# Patient Record
Sex: Female | Born: 1950 | Race: White | Hispanic: No | Marital: Married | Smoking: Never smoker
Health system: Southern US, Community
[De-identification: ages and names within clinical notes are randomized; demographics above are authoritative.]

## PROBLEM LIST (undated history)

## (undated) DIAGNOSIS — C801 Malignant (primary) neoplasm, unspecified: Secondary | ICD-10-CM

## (undated) DIAGNOSIS — K219 Gastro-esophageal reflux disease without esophagitis: Secondary | ICD-10-CM

## (undated) DIAGNOSIS — R112 Nausea with vomiting, unspecified: Secondary | ICD-10-CM

## (undated) DIAGNOSIS — Z9889 Other specified postprocedural states: Secondary | ICD-10-CM

## (undated) HISTORY — DX: Gastro-esophageal reflux disease without esophagitis: K21.9

## (undated) HISTORY — PX: TONSILLECTOMY: SUR1361

## (undated) HISTORY — PX: SKIN CANCER EXCISION: SHX779

## (undated) HISTORY — PX: BREAST BIOPSY: SHX20

## (undated) HISTORY — PX: CHOLECYSTECTOMY: SHX55

---

## 2007-12-07 HISTORY — PX: CHOLECYSTECTOMY: SHX55

## 2011-12-12 ENCOUNTER — Encounter (HOSPITAL_COMMUNITY): Payer: Self-pay | Admitting: Orthopedic Surgery

## 2011-12-12 ENCOUNTER — Emergency Department (HOSPITAL_COMMUNITY)
Admission: EM | Admit: 2011-12-12 | Discharge: 2011-12-13 | Disposition: A | Discharge status: Home / Self Care | Payer: Worker's Compensation | Attending: Emergency Medicine | Admitting: Emergency Medicine

## 2011-12-12 ENCOUNTER — Encounter (HOSPITAL_COMMUNITY): Payer: Self-pay | Admitting: Anesthesiology

## 2011-12-12 ENCOUNTER — Encounter (HOSPITAL_COMMUNITY): Payer: Self-pay | Admitting: Emergency Medicine

## 2011-12-12 ENCOUNTER — Emergency Department (HOSPITAL_COMMUNITY): Payer: Worker's Compensation

## 2011-12-12 ENCOUNTER — Emergency Department (HOSPITAL_COMMUNITY): Payer: Worker's Compensation | Admitting: Anesthesiology

## 2011-12-12 ENCOUNTER — Encounter (HOSPITAL_COMMUNITY)
Admission: EM | Disposition: A | Discharge status: Home / Self Care | Payer: Self-pay | Source: Home / Self Care | Attending: Emergency Medicine

## 2011-12-12 DIAGNOSIS — S62639B Displaced fracture of distal phalanx of unspecified finger, initial encounter for open fracture: Secondary | ICD-10-CM | POA: Insufficient documentation

## 2011-12-12 DIAGNOSIS — Y9229 Other specified public building as the place of occurrence of the external cause: Secondary | ICD-10-CM | POA: Insufficient documentation

## 2011-12-12 DIAGNOSIS — S62502B Fracture of unspecified phalanx of left thumb, initial encounter for open fracture: Secondary | ICD-10-CM

## 2011-12-12 DIAGNOSIS — W230XXA Caught, crushed, jammed, or pinched between moving objects, initial encounter: Secondary | ICD-10-CM | POA: Insufficient documentation

## 2011-12-12 DIAGNOSIS — S61209A Unspecified open wound of unspecified finger without damage to nail, initial encounter: Secondary | ICD-10-CM | POA: Insufficient documentation

## 2011-12-12 DIAGNOSIS — Y990 Civilian activity done for income or pay: Secondary | ICD-10-CM | POA: Insufficient documentation

## 2011-12-12 HISTORY — DX: Malignant (primary) neoplasm, unspecified: C80.1

## 2011-12-12 HISTORY — PX: NAILBED REPAIR: SHX5028

## 2011-12-12 HISTORY — PX: INCISION AND DRAINAGE: SHX5863

## 2011-12-12 HISTORY — PX: PERCUTANEOUS PINNING: SHX2209

## 2011-12-12 LAB — BASIC METABOLIC PANEL
BUN: 10 mg/dL (ref 6–23)
CO2: 23 mEq/L (ref 19–32)
Calcium: 9.5 mg/dL (ref 8.4–10.5)
Chloride: 105 mEq/L (ref 96–112)
Creatinine, Ser: 0.64 mg/dL (ref 0.50–1.10)
GFR calc Af Amer: >90 mL/min (ref >90)
GFR calc non Af Amer: >90 mL/min (ref >90)
Glucose, Bld: 86 mg/dL (ref 70–99)
Potassium: 3.7 mEq/L (ref 3.5–5.1)
Sodium: 140 mEq/L (ref 135–145)

## 2011-12-12 LAB — CBC WITH DIFFERENTIAL/PLATELET
Basophils Absolute: 0 10*3/uL (ref 0.0–0.1)
Basophils Relative: 0 % (ref 0–1)
Eosinophils Absolute: 0.1 10*3/uL (ref 0.0–0.7)
Eosinophils Relative: 1 % (ref 0–5)
HCT: 44.2 % (ref 36.0–46.0)
Hemoglobin: 14.7 g/dL (ref 12.0–15.0)
Lymphocytes Relative: 31 % (ref 12–46)
Lymphs Abs: 2.7 10*3/uL (ref 0.7–4.0)
MCH: 28.4 pg (ref 26.0–34.0)
MCHC: 33.3 g/dL (ref 30.0–36.0)
MCV: 85.5 fL (ref 78.0–100.0)
Monocytes Absolute: 0.3 10*3/uL (ref 0.1–1.0)
Monocytes Relative: 4 % (ref 3–12)
Neutro Abs: 5.5 10*3/uL (ref 1.7–7.7)
Neutrophils Relative %: 64 % (ref 43–77)
Platelets: 244 10*3/uL (ref 150–400)
RBC: 5.17 MIL/uL — ABNORMAL HIGH (ref 3.87–5.11)
RDW: 13.7 % (ref 11.5–15.5)
WBC: 8.7 10*3/uL (ref 4.0–10.5)

## 2011-12-12 LAB — TROPONIN I: Troponin I: <0.3 ng/mL (ref <0.30)

## 2011-12-12 IMAGING — CR DG FINGER THUMB 2+V*L*
3 series · 3 of 3 positions shown · non-contrast
Comparison: None.

CLINICAL DATA: Laceration, trauma

LEFT THUMB 2+V

[x finger pa left]
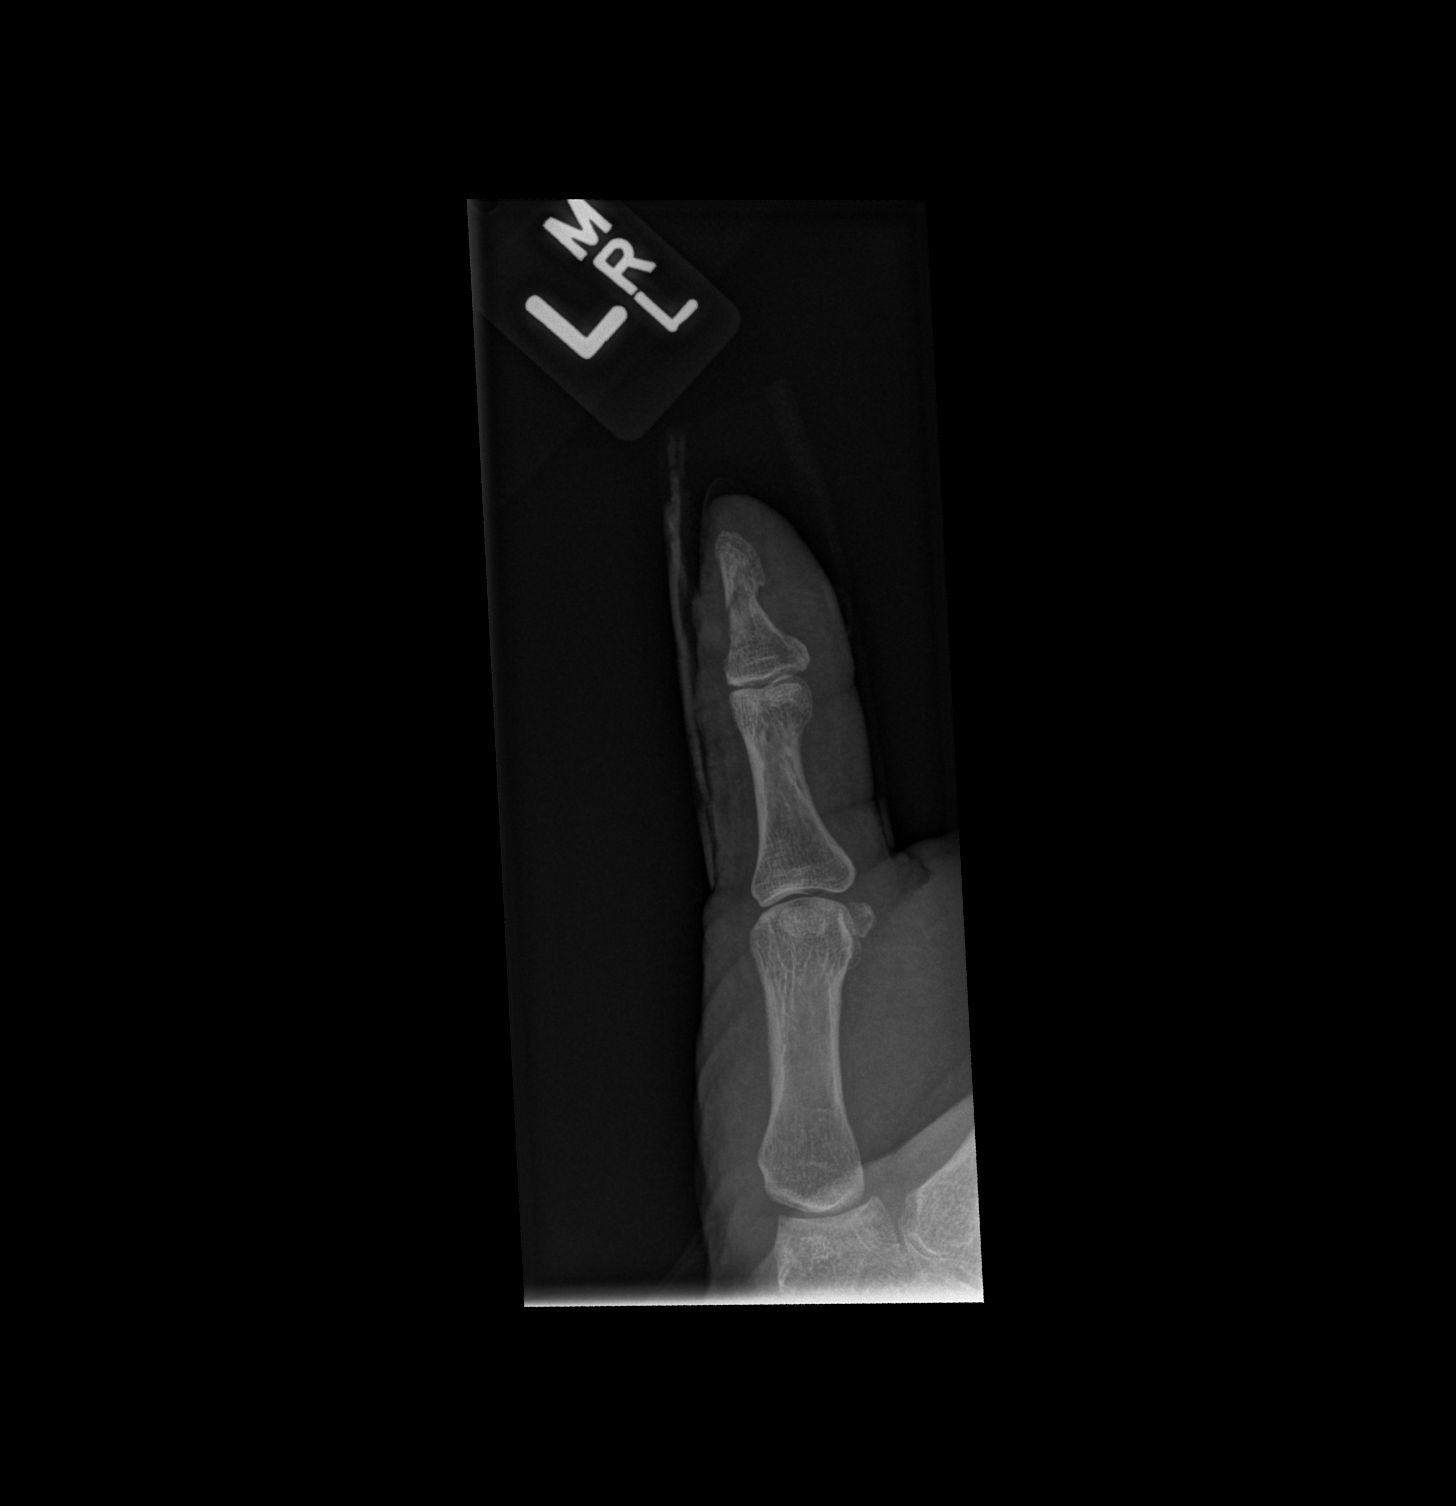

[x finger obl left]
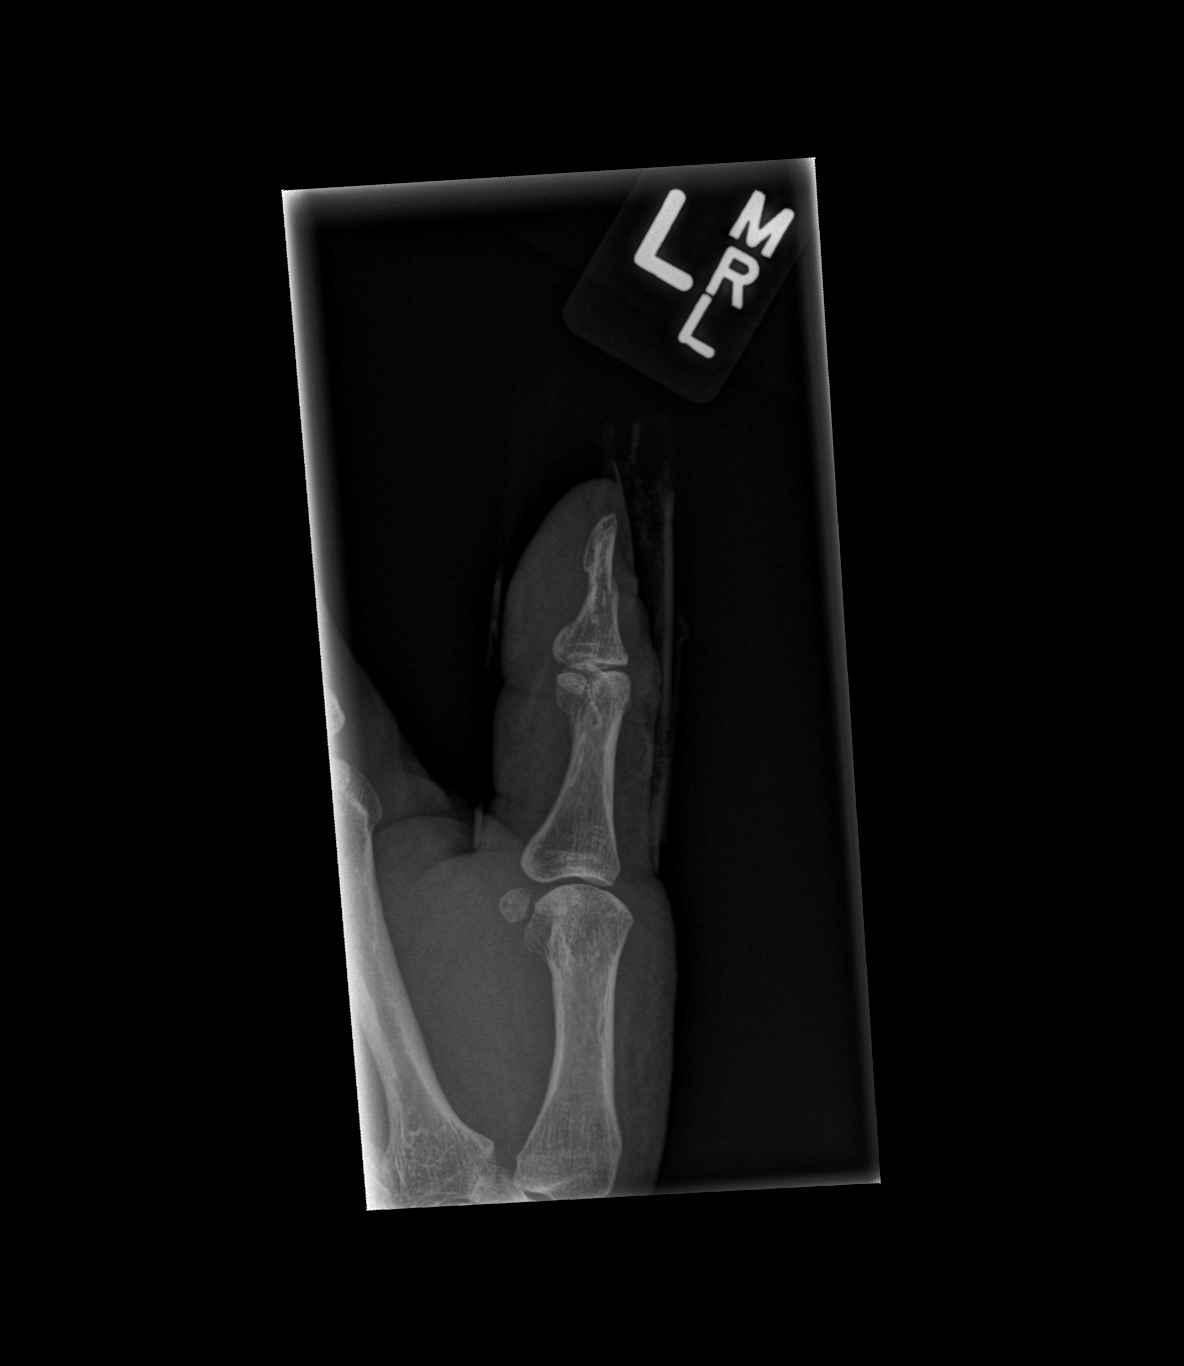

[x finger lat left]
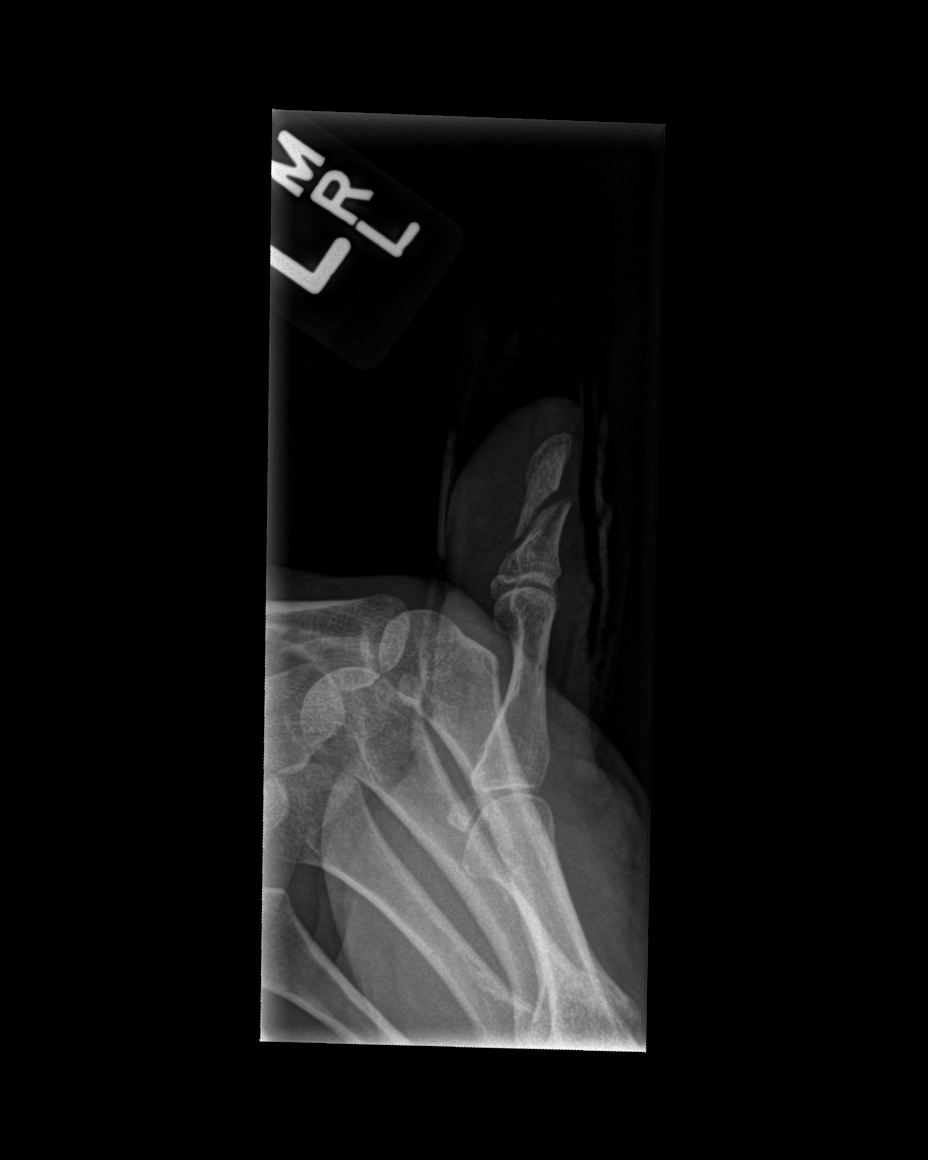

[3 of 3 positions shown; findings below may reference images not displayed]

FINDINGS: Three views of the left thumb submitted.  There is
oblique displaced fracture of distal phalanx left thumb.
IMPRESSION: Oblique mild displaced fracture of distal phalanx left thumb.

## 2011-12-12 SURGERY — REPAIR, NAIL BED
Anesthesia: General | Site: Thumb | Laterality: Left | Wound class: Contaminated

## 2011-12-12 MED ORDER — FENTANYL CITRATE 0.05 MG/ML IJ SOLN
INTRAMUSCULAR | Status: DC | PRN
  Administered 2011-12-12 (×2): 50 ug via INTRAVENOUS

## 2011-12-12 MED ORDER — 0.9 % SODIUM CHLORIDE (POUR BTL) OPTIME
TOPICAL | Status: DC | PRN
  Administered 2011-12-12: 500 mL

## 2011-12-12 MED ORDER — MIDAZOLAM HCL 5 MG/5ML IJ SOLN
INTRAMUSCULAR | Status: DC | PRN
  Administered 2011-12-12: 1 mg via INTRAVENOUS

## 2011-12-12 MED ORDER — LACTATED RINGERS IV SOLN
INTRAVENOUS | Status: DC | PRN
  Administered 2011-12-12: 23:00:00 via INTRAVENOUS

## 2011-12-12 MED ORDER — LIDOCAINE HCL 1 % IJ SOLN
5.0000 mL | Freq: Once | INTRAMUSCULAR | Status: AC
  Administered 2011-12-12: 5 mL via INTRADERMAL

## 2011-12-12 MED ORDER — MORPHINE SULFATE 4 MG/ML IJ SOLN
4.0000 mg | Freq: Once | INTRAMUSCULAR | Status: AC
  Administered 2011-12-12: 4 mg via INTRAMUSCULAR
  Filled 2011-12-12: qty 1

## 2011-12-12 MED ORDER — CEFAZOLIN SODIUM-DEXTROSE 2-3 GM-% IV SOLR
INTRAVENOUS | Status: DC | PRN
  Administered 2011-12-12: 2 g via INTRAVENOUS

## 2011-12-12 MED ORDER — CEFAZOLIN SODIUM-DEXTROSE 2-3 GM-% IV SOLR
INTRAVENOUS | Status: AC
  Filled 2011-12-12: qty 50

## 2011-12-12 MED ORDER — BUPIVACAINE HCL (PF) 0.5 % IJ SOLN
INTRAMUSCULAR | Status: DC | PRN
  Administered 2011-12-12: 10 mL

## 2011-12-12 MED ORDER — LIDOCAINE HCL (CARDIAC) 20 MG/ML IV SOLN
INTRAVENOUS | Status: DC | PRN
  Administered 2011-12-12: 80 mg via INTRAVENOUS

## 2011-12-12 MED ORDER — SODIUM CHLORIDE 0.9 % IV BOLUS (SEPSIS)
1000.0000 mL | Freq: Once | INTRAVENOUS | Status: AC
  Administered 2011-12-12: 1000 mL via INTRAVENOUS

## 2011-12-12 MED ORDER — ONDANSETRON HCL 4 MG/2ML IJ SOLN
INTRAMUSCULAR | Status: DC | PRN
  Administered 2011-12-12: 4 mg via INTRAVENOUS

## 2011-12-12 MED ORDER — PROPOFOL 10 MG/ML IV BOLUS
INTRAVENOUS | Status: DC | PRN
  Administered 2011-12-12: 160 mg via INTRAVENOUS

## 2011-12-12 MED ORDER — BUPIVACAINE HCL (PF) 0.5 % IJ SOLN
INTRAMUSCULAR | Status: AC
  Filled 2011-12-12: qty 30

## 2011-12-12 MED ORDER — ONDANSETRON 8 MG PO TBDP
8.0000 mg | ORAL_TABLET | Freq: Once | ORAL | Status: AC
  Administered 2011-12-12: 8 mg via ORAL
  Filled 2011-12-12: qty 1

## 2011-12-12 MED ORDER — LIDOCAINE HCL (PF) 1 % IJ SOLN: 5.0000 mL | Freq: Once | INTRAMUSCULAR | Status: DC

## 2011-12-12 MED ORDER — SODIUM CHLORIDE 0.9 % IV SOLN: Freq: Once | INTRAVENOUS | Status: DC

## 2011-12-12 SURGICAL SUPPLY — 40 items
BAG ZIPLOCK 12X15 (MISCELLANEOUS) ×2 IMPLANT
BANDAGE ELASTIC 3 VELCRO ST LF (GAUZE/BANDAGES/DRESSINGS) ×2 IMPLANT
BANDAGE ELASTIC 4 VELCRO ST LF (GAUZE/BANDAGES/DRESSINGS) IMPLANT
BANDAGE GAUZE ELAST BULKY 4 IN (GAUZE/BANDAGES/DRESSINGS) ×2 IMPLANT
BNDG COHESIVE 4X5 TAN STRL (GAUZE/BANDAGES/DRESSINGS) IMPLANT
CANISTER SUCTION 2500CC (MISCELLANEOUS) ×2 IMPLANT
CLEANER TIP ELECTROSURG 2X2 (MISCELLANEOUS) ×2 IMPLANT
CLOTH BEACON ORANGE TIMEOUT ST (SAFETY) ×2 IMPLANT
CORDS BIPOLAR (ELECTRODE) ×2 IMPLANT
CUFF TOURN SGL QUICK 18 (TOURNIQUET CUFF) ×2 IMPLANT
CUFF TOURN SGL QUICK 24 (TOURNIQUET CUFF)
CUFF TRNQT CYL 24X4X40X1 (TOURNIQUET CUFF) IMPLANT
DRAIN PENROSE 18X1/2 LTX STRL (DRAIN) IMPLANT
DRSG PAD ABDOMINAL 8X10 ST (GAUZE/BANDAGES/DRESSINGS) IMPLANT
ELECT REM PT RETURN 9FT ADLT (ELECTROSURGICAL)
ELECTRODE REM PT RTRN 9FT ADLT (ELECTROSURGICAL) IMPLANT
GAUZE PACKING IODOFORM 1/2 (PACKING) IMPLANT
GAUZE PACKING IODOFORM 1/4X5 (PACKING) IMPLANT
GAUZE XEROFORM 1X8 LF (GAUZE/BANDAGES/DRESSINGS) ×2 IMPLANT
GAUZE XEROFORM 5X9 LF (GAUZE/BANDAGES/DRESSINGS) IMPLANT
GLOVE SURG ORTHO 8.0 STRL STRW (GLOVE) ×2 IMPLANT
GOWN STRL REIN XL XLG (GOWN DISPOSABLE) ×4 IMPLANT
HANDPIECE INTERPULSE COAX TIP (DISPOSABLE)
KIT BASIN OR (CUSTOM PROCEDURE TRAY) ×2 IMPLANT
KWIRE 4.0 X .035IN (WIRE) ×2 IMPLANT
MANIFOLD NEPTUNE II (INSTRUMENTS) IMPLANT
PACK LOWER EXTREMITY WL (CUSTOM PROCEDURE TRAY) ×2 IMPLANT
PAD CAST 4YDX4 CTTN HI CHSV (CAST SUPPLIES) IMPLANT
PADDING CAST ABS 3INX4YD NS (CAST SUPPLIES) ×1
PADDING CAST ABS COTTON 3X4 (CAST SUPPLIES) ×1 IMPLANT
PADDING CAST COTTON 4X4 STRL (CAST SUPPLIES)
SET HNDPC FAN SPRY TIP SCT (DISPOSABLE) IMPLANT
SPLINT PLASTER EXTRA FAST 3X15 (CAST SUPPLIES) ×1
SPLINT PLASTER GYPS XFAST 3X15 (CAST SUPPLIES) ×1 IMPLANT
SPONGE GAUZE 4X4 12PLY (GAUZE/BANDAGES/DRESSINGS) ×4 IMPLANT
SUT CHROMIC 6 0 (SUTURE) ×2 IMPLANT
SUT ETHILON 4 0 PS 2 18 (SUTURE) ×4 IMPLANT
SUT SILK 4 0 PS 2 (SUTURE) ×2 IMPLANT
SYR 20CC LL (SYRINGE) IMPLANT
SYR CONTROL 10ML LL (SYRINGE) ×2 IMPLANT

## 2011-12-12 NOTE — Progress Notes (Signed)
pcp per pt is [employer] EPIC updated

## 2011-12-12 NOTE — ED Notes (Signed)
Pt c/o chest pain. ECG completed.  Will notify MD.

## 2011-12-12 NOTE — ED Provider Notes (Signed)
History     CSN: [id number]  Arrival date & time 12/12/11  1239   First MD Initiated Contact with Patient 12/12/11 1313      Chief Complaint  Patient presents with  . Extremity Laceration  . 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 female who presented to ED with complaint of left thumb injury. Pt states she closed her hand in a bank safe. Laceration with fracture. Pt was seen at urgent care, fracture seen, sent here to be seen by a hand specialist. Pt denies numbness or weakness distal to the injury. No other injuries.    Past Medical History  Diagnosis Date  . Cancer     Past Surgical History  Procedure Date  . Cholecystectomy   . Skin cancer excision   . Cesarean section   . Tonsillectomy     Family History  Problem Relation Age of Onset  . Hypertension Mother   . Diabetes Mother   . Cancer Mother     History  Substance Use Topics  . Smoking status: Never Smoker   . Smokeless tobacco: Not on file  . Alcohol Use: No    OB History    Grav Para Term Preterm Abortions TAB SAB Ect Mult Living                  Review of Systems  Constitutional: Negative for fever and chills.  Musculoskeletal: Positive for arthralgias.  Skin: Positive for wound.  Neurological: Negative for dizziness, weakness and numbness.    Allergies  Review of patient's allergies indicates no known allergies.  Home Medications   Current Outpatient Rx  Name  Route  Sig  Dispense  Refill  . ACETAMINOPHEN 500 MG PO TABS   Oral   Take 500 mg by mouth every 6 (six) hours as needed. pain         . CALCIUM CARBONATE-VITAMIN D 500-200 MG-UNIT PO TABS   Oral   Take 1 tablet by mouth daily.         Marland Kitchen VITAMIN D-3 1000 UNITS PO CAPS   Oral   Take 1,000 Units by mouth daily.         Marland Kitchen FLAXSEED OIL PO   Oral   Take 1 capsule by mouth daily.          . MELOXICAM PO   Oral   Take 15 mg by mouth daily as needed. Pain         . FISH OIL PO   Oral   Take 1 capsule by mouth daily.         Marland Kitchen OMEPRAZOLE 20 MG PO CPDR   Oral   Take 20 mg by mouth daily.         Marland Kitchen METAMUCIL PO   Oral   Take 1 packet by mouth daily.           BP 145/83  Pulse 88  Temp 98 F (36.7 C) (Oral)  Resp 18  Wt 180 lb (81.647 kg)  SpO2 100%  Physical Exam  Nursing note and vitals reviewed. Constitutional: She is oriented to person, place, and time. She appears well-developed and well-nourished. No distress.  Eyes: Conjunctivae normal are normal.  Cardiovascular: Normal rate, regular rhythm and normal heart sounds.   Pulmonary/Chest: Effort normal and breath sounds normal. No respiratory distress. She has no wheezes. She has no rales.  Musculoskeletal:       There is a laceration and deformity to the left distal thumb. Laceration through the radial aspect of distal thumb, just proximal to the finger nail. During my exam pt already had a block by PA green. Unable to assess sensation. Normal MCP joint  Neurological: She is alert and oriented to person, place, and time.  Skin: Skin is warm and dry.  Psychiatric: She has a normal mood and affect.    ED Course  Procedures (including critical care time)   Labs Reviewed  CBC WITH DIFFERENTIAL  BASIC METABOLIC PANEL   Dg Finger Thumb Left  12/12/2011  *RADIOLOGY REPORT*  Clinical Data: Laceration, trauma  LEFT THUMB 2+V  Comparison: None.  Findings: Three views of the left thumb submitted.  There is oblique displaced fracture of distal phalanx left thumb.  IMPRESSION: Oblique mild displaced fracture of distal phalanx left thumb.   Original Report Authenticated By: Natasha Mead, M.D.     4:14 PM Spoke with Dr. Merlyn Lot, will come see in ED, prob in 2hrs. currently in surgery. Pt made aware. Pt is NPO   8:05 PM Pt had an episode of chest pain after receiving morphine. ECG obtained, unremarkable,  tropnonin ordered. Pt has no cardiac hx, pain is sharp, comes and goes.    Date: 12/13/2011  Rate: 80Rhythm: normal sinus rhythm  QRS Axis: left  Intervals: normal  ST/T Wave abnormalities: normal  Conduction Disutrbances:none  Narrative Interpretation:   Old EKG Reviewed: none available    pt taken to OR  1. Open fracture of left thumb       MDM          Lottie Mussel, PA 12/14/11 [phone]

## 2011-12-12 NOTE — Op Note (Signed)
Dictation [phone]

## 2011-12-12 NOTE — ED Provider Notes (Signed)
Stacy Clayton is a 61 y.o. female who injured her left thumb, when it was shut in a heavy drawer. She has an isolated injury to the left thumb. She was seen at an outlying urgent care, and sent here, for definitive treatment. She has a disc, with her, reported to have images on it; they could not be retrieved.  Left thumb, gaping laceration, radial aspect, just behind a with slight ulnar deviation to the distal thumb. Tiny subungual hematoma. Intact distal sensation and circulation.  We'll reimage thumb.  Medical screening examination/treatment/procedure(s) were conducted as a shared visit with non-physician practitioner(s) and myself.  I personally evaluated the patient during the encounter  Flint Melter, MD 12/15/11 [phone]

## 2011-12-12 NOTE — ED Provider Notes (Signed)
MSE was initiated and I personally evaluated the patient and placed orders  (right thumb xray, npo, cbc, bmp, right thumb digital block)  at  2:14 PM on December 12, 2011. Dr. Effie Shy examined pt briefly as well.  The patient appears stable so that the remainder of the MSE may be completed by another provider.    Dorthula Matas, PA 12/12/11 1546

## 2011-12-12 NOTE — H&P (Signed)
Stacy Clayton is an 61 y.o. female.   Chief Complaint: left thumb tip crush HPI: 61 yo female caught left thumb in safe door early today causing crush to tip of thumb with laceration ulnarly.  Seen at [facility] where XR revealed distal phalanx fracture.  Reports no previous injury to thumb and no other injury at this time.  Past Medical History  Diagnosis Date  . Cancer     Past Surgical History  Procedure Date  . Cholecystectomy   . Skin cancer excision   . Cesarean section   . Tonsillectomy     Family History  Problem Relation Age of Onset  . Hypertension Mother   . Diabetes Mother   . Cancer Mother    Social History:  reports that she has never smoked. She does not have any smokeless tobacco history on file. She reports that she does not drink alcohol. Her drug history not on file.  Allergies: No Known Allergies   (Not in a hospital admission)  Results for orders placed during the hospital encounter of 12/12/11 (from the past 48 hour(s))  CBC WITH DIFFERENTIAL     Status: Abnormal   Collection Time   12/12/11  3:03 PM      Component Value Range Comment   WBC 8.7  4.0 - 10.5 K/uL    RBC 5.17 (*) 3.87 - 5.11 MIL/uL    Hemoglobin 14.7  12.0 - 15.0 g/dL    HCT 16.1  09.6 - 04.5 %    MCV 85.5  78.0 - 100.0 fL    MCH 28.4  26.0 - 34.0 pg    MCHC 33.3  30.0 - 36.0 g/dL    RDW 40.9  81.1 - 91.4 %    Platelets 244  150 - 400 K/uL    Neutrophils Relative 64  43 - 77 %    Neutro Abs 5.5  1.7 - 7.7 K/uL    Lymphocytes Relative 31  12 - 46 %    Lymphs Abs 2.7  0.7 - 4.0 K/uL    Monocytes Relative 4  3 - 12 %    Monocytes Absolute 0.3  0.1 - 1.0 K/uL    Eosinophils Relative 1  0 - 5 %    Eosinophils Absolute 0.1  0.0 - 0.7 K/uL    Basophils Relative 0  0 - 1 %    Basophils Absolute 0.0  0.0 - 0.1 K/uL   BASIC METABOLIC PANEL     Status: Normal   Collection Time   12/12/11  3:03 PM      Component Value Range Comment   Sodium 140  135 - 145 mEq/L    Potassium 3.7  3.5 - 5.1  mEq/L    Chloride 105  96 - 112 mEq/L    CO2 23  19 - 32 mEq/L    Glucose, Bld 86  70 - 99 mg/dL    BUN 10  6 - 23 mg/dL    Creatinine, Ser 7.82  0.50 - 1.10 mg/dL    Calcium 9.5  8.4 - 95.6 mg/dL    GFR calc non Af Amer >90  >90 mL/min    GFR calc Af Amer >90  >90 mL/min   TROPONIN I     Status: Normal   Collection Time   12/12/11  9:20 PM      Component Value Range Comment   Troponin I <0.30  <0.30 ng/mL     Dg Finger Thumb Left  12/12/2011  *RADIOLOGY  REPORT*  Clinical Data: Laceration, trauma  LEFT THUMB 2+V  Comparison: None.  Findings: Three views of the left thumb submitted.  There is oblique displaced fracture of distal phalanx left thumb.  IMPRESSION: Oblique mild displaced fracture of distal phalanx left thumb.   Original Report Authenticated By: Natasha Mead, M.D.      A comprehensive review of systems was negative.  Blood pressure 138/87, pulse 76, temperature 98 F (36.7 C), temperature source Oral, resp. rate 18, weight 81.647 kg (180 lb), SpO2 99.00%.  General appearance: alert, cooperative and appears stated age Head: Normocephalic, without obvious abnormality, atraumatic Neck: supple, symmetrical, trachea midline Resp: clear to auscultation bilaterally Cardio: regular rate and rhythm GI: non tender Extremities: light touch sensation and capillary refill intact all digits.  +epl/fpl/io.  laceration ulnar side of left thumb. Pulses: 2+ and symmetric Skin: as above Neurologic: Grossly normal Incision/Wound: As above  Assessment/Plan Left thumb tip crush injury with distal phalanx fracture.  Recommend OR for I&D and fixation of fracture with nailbed repair if necessary.  Risks, benefits, and alternatives of surgery were discussed and the patient agrees with the plan of care.   Currie Dennin R 12/12/2011, 10:58 PM

## 2011-12-12 NOTE — Preoperative (Signed)
Beta Blockers   Reason not to administer Beta Blockers:Not Applicable 

## 2011-12-12 NOTE — Anesthesia Preprocedure Evaluation (Addendum)
Anesthesia Evaluation  Patient identified by MRN, date of birth, ID band Patient awake    Reviewed: Allergy & Precautions, H&P , NPO status , Patient's Chart, lab work & pertinent test results  Airway Mallampati: I TM Distance: >3 FB Neck ROM: Full    Dental  (+) Dental Advisory Given and Teeth Intact   Pulmonary neg pulmonary ROS,  breath sounds clear to auscultation  Pulmonary exam normal       Cardiovascular - Past MI Rhythm:Regular Rate:Normal     Neuro/Psych negative neurological ROS  negative psych ROS   GI/Hepatic negative GI ROS, Neg liver ROS,   Endo/Other  negative endocrine ROS  Renal/GU negative Renal ROS     Musculoskeletal negative musculoskeletal ROS (+)   Abdominal   Peds  Hematology negative hematology ROS (+)   Anesthesia Other Findings   Reproductive/Obstetrics                          Anesthesia Physical Anesthesia Plan  ASA: II  Anesthesia Plan: General   Post-op Pain Management:    Induction: Intravenous  Airway Management Planned: LMA  Additional Equipment:   Intra-op Plan:   Post-operative Plan: Extubation in OR  Informed Consent: I have reviewed the patients History and Physical, chart, labs and discussed the procedure including the risks, benefits and alternatives for the proposed anesthesia with the patient or authorized representative who has indicated his/her understanding and acceptance.   Dental advisory given  Plan Discussed with: CRNA  Anesthesia Plan Comments:        Anesthesia Quick Evaluation

## 2011-12-12 NOTE — ED Notes (Signed)
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 Seen at care. Dx- fractures in thumb, referred to be seen by hand specialist

## 2011-12-13 MED ORDER — OXYCODONE-ACETAMINOPHEN 5-325 MG PO TABS: ORAL_TABLET | ORAL | Status: DC

## 2011-12-13 MED ORDER — SULFAMETHOXAZOLE-TRIMETHOPRIM 800-160 MG PO TABS: 1.0000 | ORAL_TABLET | Freq: Two times a day (BID) | ORAL | Status: DC

## 2011-12-13 NOTE — Op Note (Signed)
NAME:  Stacy Clayton, Stacy Clayton                  ACCOUNT NO.:  [id number]  MEDICAL RECORD NO.:  [id number]  LOCATION:  [facility]                         FACILITY:  [facility]  PHYSICIAN:  Betha Loa, MD        DATE OF BIRTH:  1950/02/27  DATE OF PROCEDURE:  12/12/2011 DATE OF DISCHARGE:  12/13/2011                              OPERATIVE REPORT   PREOPERATIVE DIAGNOSIS:  Left thumb tip crush injury with open distal phalanx fracture and nail bed laceration.  POSTOPERATIVE DIAGNOSIS:  Left thumb tip crush injury with open distal phalanx fracture and nail bed laceration.  PROCEDURE:  Left thumb irrigation and debridement of open fracture, open reduction and percutaneous pinning of fracture and repair of nail bed and skin lacerations.  SURGEON:  Betha Loa, MD  ASSISTANT:  None.  ANESTHESIA:  General.  IV FLUIDS:  Per anesthesia flow sheet.  ESTIMATED BLOOD LOSS:  Minimal.  COMPLICATIONS:  None.  SPECIMENS:  None.  TOURNIQUET TIME:  30 minutes.  DISPOSITION:  Stable to PACU.  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 She states that her left thumb was smashed in the safe door of an ATM this afternoon.  She presented to an urgent care facility in [place] and was transferred to [facility].  Radiographs revealed a distal phalanx fracture with displacement.  On evaluation, she had intact sensation and capillary refill in the thumb tip.  I recommended going to the operating room for irrigation and debridement of the fracture with pin fixation of the fracture and repair of nail bed injury.  Risks, benefits, and alternatives of surgery were discussed including risk of blood loss, infection, damage to nerves, vessels, tendons, ligaments, bone; failure of surgery; need for additional surgery, complications with wound healing; continued pain; nonunion; malunion; stiffness.  She voiced understanding of these risks and elected to proceed.  OPERATIVE COURSE:  After being  identified preoperatively by myself, the patient and I agreed upon procedure and site of procedure.  Surgical site was marked.  The risks, benefits, and alternatives of surgery were reviewed and she wished to proceed.  Surgical consent had been signed. She was given IV Ancef as preoperative antibiotic prophylaxis and coverage for the fracture.  Her tetanus had been updated at the urgent care facility.  She was transported to the operating room and placed on the operative table in supine position with left upper extremity on arm board.  General anesthesia was induced by anesthesiologist.  The left upper extremity was prepped and draped in normal sterile orthopedic fashion.  Surgical pause was performed between surgeons, anesthesia, operating staff, and all were in agreement as to the patient, procedure, and site of procedure.  Tourniquet at the proximal aspect of the extremity was inflated to 250 mmHg after exsanguination of the limb with an Esmarch bandage.  The wound was explored.  The nail was removed using a freer elevator.  There was a laceration transversely across the nail bed just distal to the lunula.  The fracture site was easily identified within.  There was no gross contamination.  The clot had been removed. The wound was copiously irrigated with 1000  mL of sterile saline.  The fracture was reduced under direct visualization and two 0.035-inch K- wire was advanced from the tip of the thumb across the fracture site and into the base of the distal phalanx.  This was adequate to stabilize the fracture.  C-arm was used in AP and lateral projections to ensure appropriate reduction and position of hardware which was the case.  No bed repair was performed with 6-0 chromic gut suture in an interrupted fashion.  The skin was repaired with 5-0 Monocryl in interrupted fashion.  A piece of Xeroform was placed in nail fold and all wounds were dressed with sterile Xeroform.  The pins were  bent and cut short. Wounds were dressed with sterile Xeroform, 4x4s, and wrapped with a Kerlix bandage.  A thumb spica splint was placed and wrapped with Kerlix and Ace bandage.  Tourniquet was deflated at 30 minutes.  Fingertips were pink with brisk capillary refill after deflation of tourniquet. Operative drapes were broken down and the patient was awoken from anesthesia safely.  She was transferred back to the stretcher and taken to PACU in stable condition.  I will see her back in the office in 1 week for postoperative followup.  I will give Percocet 5/325, 1-2 p.o. q.6 hours p.r.n. pain, dispensed #40 and Bactrim DS 1 p.o. b.i.d. x7 days.     Betha Loa, MD     KK/MEDQ  D:  12/13/2011  T:  12/13/2011  Job:  [id number]

## 2011-12-13 NOTE — Anesthesia Postprocedure Evaluation (Signed)
  Anesthesia Post-op Note  Patient: Stacy Clayton  Procedure(s) Performed: Procedure(s) (LRB) with comments: NAILBED REPAIR (Left) PERCUTANEOUS PINNING EXTREMITY (Left) - Pinning of distal phalanax fracture of left thumb INCISION AND DRAINAGE (Left) - Incision and drainage of open left thumb distal phalanax fracture and nail bed injury  Patient Location: PACU  Anesthesia Type:General  Level of Consciousness: awake, alert , oriented and patient cooperative  Airway and Oxygen Therapy: Patient Spontanous Breathing and Patient connected to face mask oxygen  Post-op Pain: none  Post-op Assessment: Post-op Vital signs reviewed, Patient's Cardiovascular Status Stable, Respiratory Function Stable, Patent Airway, No signs of Nausea or vomiting and Pain level controlled  Post-op Vital Signs: Reviewed and stable  Complications: No apparent anesthesia complications

## 2011-12-13 NOTE — Transfer of Care (Signed)
Immediate Anesthesia Transfer of Care Note  Patient: Stacy Clayton  Procedure(s) Performed: Procedure(s) (LRB) with comments: NAILBED REPAIR (Left) PERCUTANEOUS PINNING EXTREMITY (Left) - Pinning of distal phalanax fracture of left thumb  Patient Location: PACU  Anesthesia Type:General  Level of Consciousness: awake, alert , oriented and patient cooperative  Airway & Oxygen Therapy: Patient Spontanous Breathing and Patient connected to face mask oxygen  Post-op Assessment: Report given to PACU RN, Post -op Vital signs reviewed and stable and Patient moving all extremities  Post vital signs: Reviewed and stable  Complications: No apparent anesthesia complications

## 2011-12-15 ENCOUNTER — Encounter (HOSPITAL_COMMUNITY): Payer: Self-pay | Admitting: Orthopedic Surgery

## 2011-12-15 NOTE — ED Provider Notes (Signed)
Medical screening examination/treatment/procedure(s) were performed by non-physician practitioner and as supervising physician I was immediately available for consultation/collaboration.  Doug Sou, MD 12/15/11 [phone]

## 2014-02-08 ENCOUNTER — Other Ambulatory Visit: Payer: Self-pay | Admitting: Obstetrics and Gynecology

## 2014-02-08 DIAGNOSIS — N6452 Nipple discharge: Secondary | ICD-10-CM

## 2014-02-08 DIAGNOSIS — N644 Mastodynia: Secondary | ICD-10-CM

## 2014-02-15 ENCOUNTER — Other Ambulatory Visit: Payer: Self-pay

## 2014-02-20 ENCOUNTER — Inpatient Hospital Stay: Admission: RE | Admit: 2014-02-20 | Payer: Self-pay | Source: Ambulatory Visit

## 2014-02-20 ENCOUNTER — Other Ambulatory Visit: Payer: Self-pay

## 2014-02-22 ENCOUNTER — Ambulatory Visit
Admission: RE | Admit: 2014-02-22 | Discharge: 2014-02-22 | Disposition: A | Discharge status: Home / Self Care | Payer: BLUE CROSS/BLUE SHIELD | Source: Ambulatory Visit | Attending: Obstetrics and Gynecology | Admitting: Obstetrics and Gynecology

## 2014-02-22 DIAGNOSIS — N6452 Nipple discharge: Secondary | ICD-10-CM

## 2014-02-22 DIAGNOSIS — N644 Mastodynia: Secondary | ICD-10-CM

## 2014-02-22 IMAGING — MG MM DIAGNOSTIC BILATERAL
4 series · 4 of 4 positions shown · non-contrast
Comparison: With priors.

CLINICAL DATA: 63-year-old female complaining of diffuse right
breast pain. The patient states that she had 1 episode of expressed
left nipple discharge. She states that she has not had any
spontaneous nipple discharge.

EXAM:
DIGITAL DIAGNOSTIC BILATERAL MAMMOGRAM WITH CAD

[R CC]
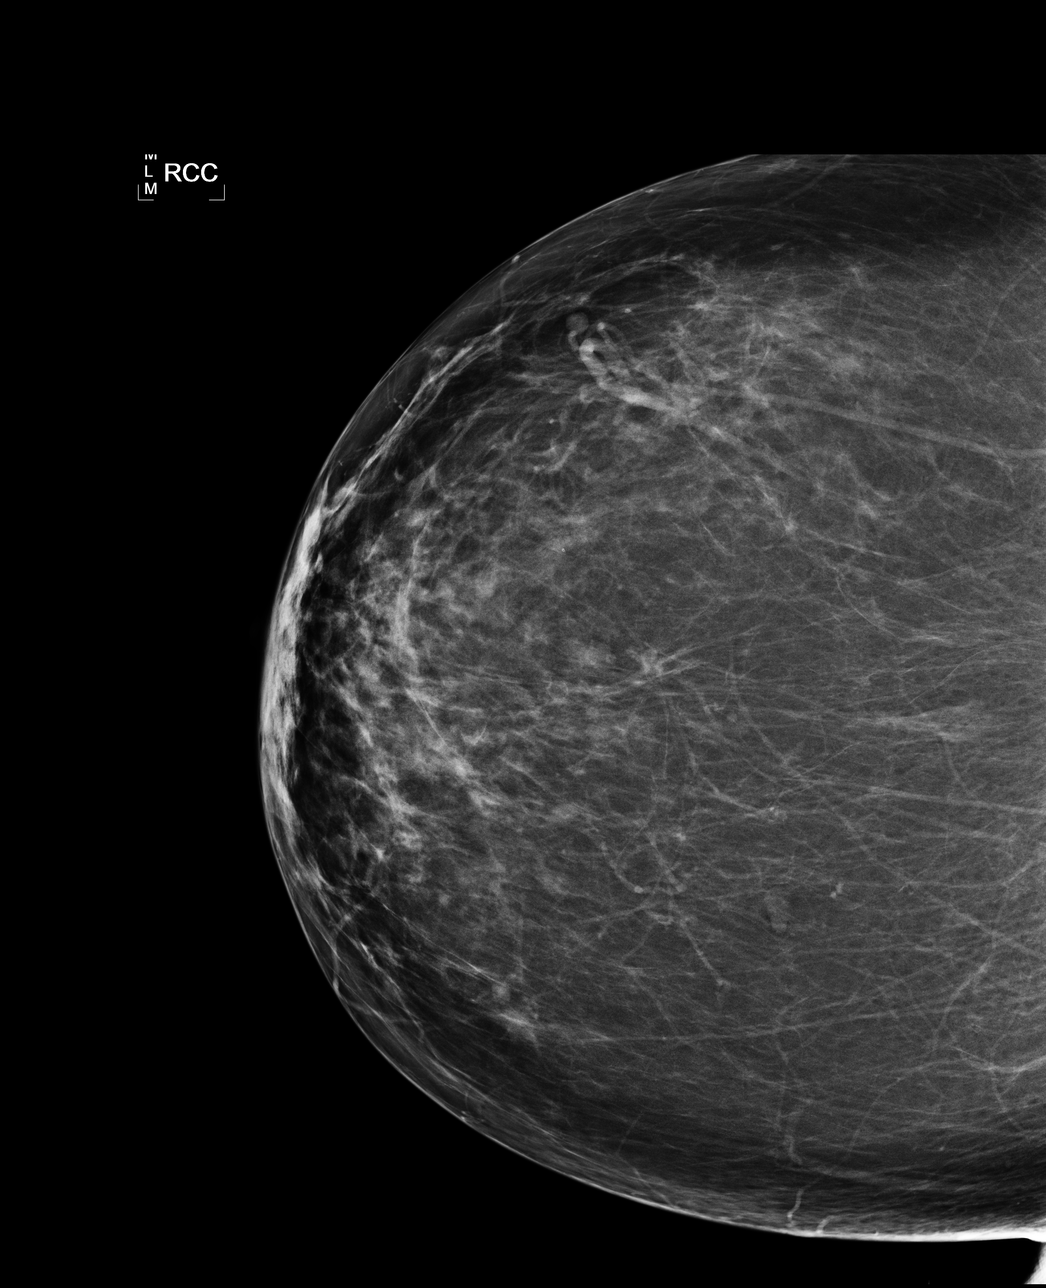

[L CC]
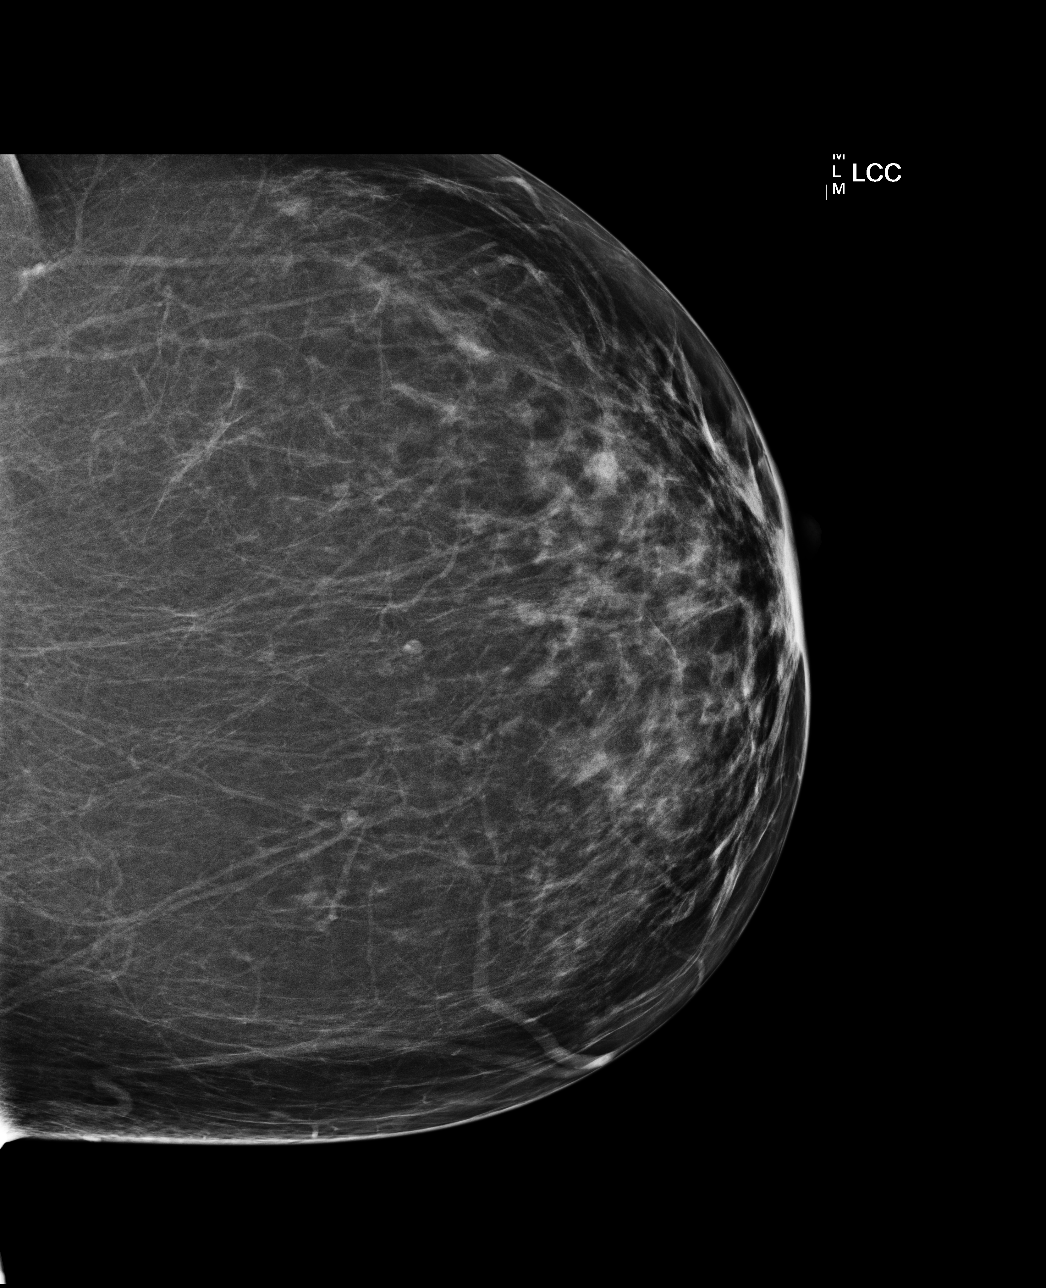

[L MLO]
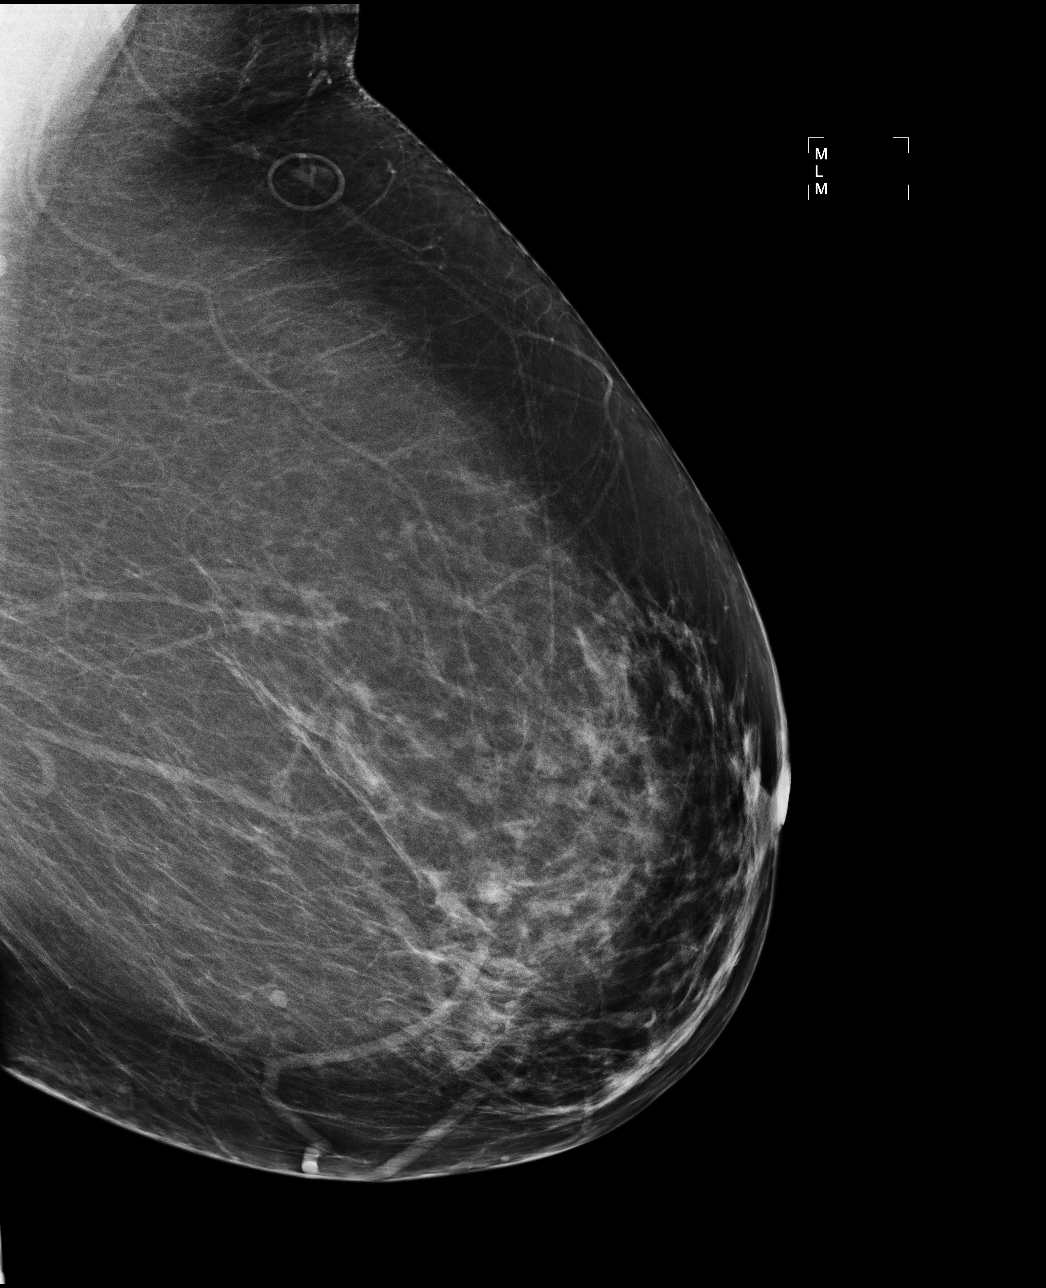

[R MLO]
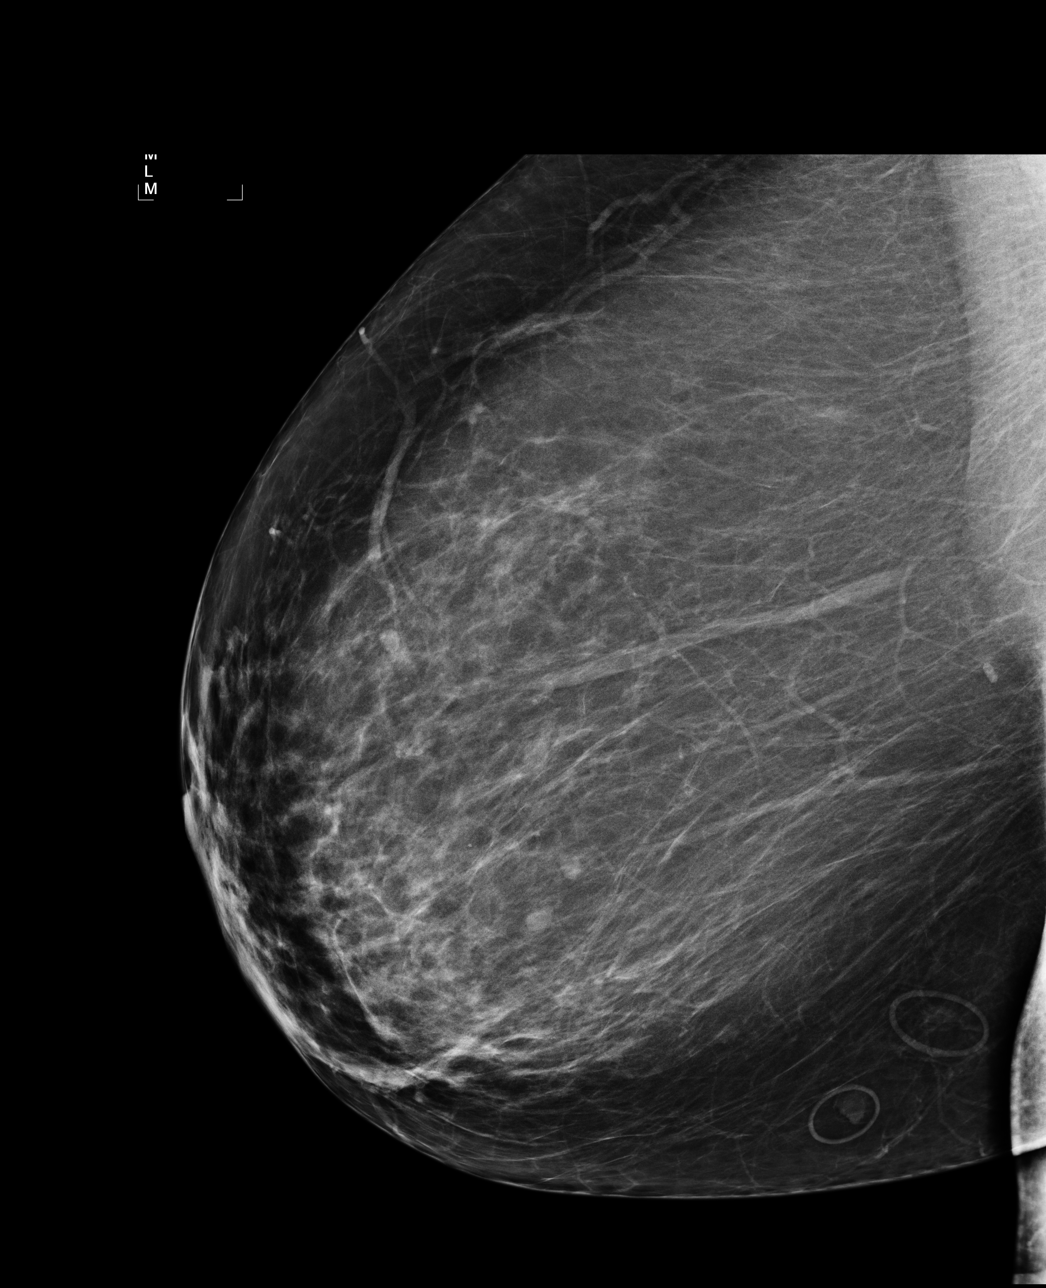

[4 of 4 positions shown; findings below may reference images not displayed]

ACR Breast Density Category b: There are scattered areas of
fibroglandular density.
FINDINGS: No suspicious mass or malignant type microcalcifications identified
in either breast.

Mammographic images were processed with CAD.
IMPRESSION: No evidence of malignancy in either breast.

RECOMMENDATION:
Bilateral screening mammogram in 1 year is recommended. Concerning
nipple discharge was discussed with the patient.

I have discussed the findings and recommendations with the patient.
Results were also provided in writing at the conclusion of the
visit. If applicable, a reminder letter will be sent to the patient
regarding the next appointment.

BI-RADS CATEGORY  1: Negative

## 2014-02-28 ENCOUNTER — Other Ambulatory Visit: Payer: Self-pay

## 2015-02-20 DIAGNOSIS — L219 Seborrheic dermatitis, unspecified: Secondary | ICD-10-CM | POA: Diagnosis not present

## 2015-02-20 DIAGNOSIS — D225 Melanocytic nevi of trunk: Secondary | ICD-10-CM | POA: Diagnosis not present

## 2015-02-20 DIAGNOSIS — L821 Other seborrheic keratosis: Secondary | ICD-10-CM | POA: Diagnosis not present

## 2015-04-26 DIAGNOSIS — K219 Gastro-esophageal reflux disease without esophagitis: Secondary | ICD-10-CM | POA: Diagnosis not present

## 2015-04-26 DIAGNOSIS — G43109 Migraine with aura, not intractable, without status migrainosus: Secondary | ICD-10-CM | POA: Diagnosis not present

## 2015-04-26 DIAGNOSIS — E663 Overweight: Secondary | ICD-10-CM | POA: Diagnosis not present

## 2015-04-26 DIAGNOSIS — Z79899 Other long term (current) drug therapy: Secondary | ICD-10-CM | POA: Diagnosis not present

## 2015-04-26 DIAGNOSIS — I10 Essential (primary) hypertension: Secondary | ICD-10-CM | POA: Diagnosis not present

## 2015-09-12 DIAGNOSIS — Z1231 Encounter for screening mammogram for malignant neoplasm of breast: Secondary | ICD-10-CM | POA: Diagnosis not present

## 2015-09-12 DIAGNOSIS — Z6829 Body mass index (BMI) 29.0-29.9, adult: Secondary | ICD-10-CM | POA: Diagnosis not present

## 2015-09-12 DIAGNOSIS — Z124 Encounter for screening for malignant neoplasm of cervix: Secondary | ICD-10-CM | POA: Diagnosis not present

## 2015-09-18 ENCOUNTER — Other Ambulatory Visit: Payer: Self-pay | Admitting: Obstetrics and Gynecology

## 2015-09-18 DIAGNOSIS — R928 Other abnormal and inconclusive findings on diagnostic imaging of breast: Secondary | ICD-10-CM

## 2015-09-26 ENCOUNTER — Other Ambulatory Visit: Payer: Self-pay | Admitting: Obstetrics and Gynecology

## 2015-09-26 ENCOUNTER — Ambulatory Visit
Admission: RE | Admit: 2015-09-26 | Discharge: 2015-09-26 | Disposition: A | Discharge status: Home / Self Care | Payer: Medicare Other | Source: Ambulatory Visit | Attending: Obstetrics and Gynecology | Admitting: Obstetrics and Gynecology

## 2015-09-26 DIAGNOSIS — R928 Other abnormal and inconclusive findings on diagnostic imaging of breast: Secondary | ICD-10-CM

## 2015-09-26 DIAGNOSIS — N63 Unspecified lump in unspecified breast: Secondary | ICD-10-CM

## 2015-09-26 IMAGING — US ULTRASOUND LEFT BREAST LIMITED
1 series · 13 of 13 positions shown · non-contrast
Comparison: Comparison [DATE], [DATE], [DATE],
[DATE], [DATE].

CLINICAL DATA: Recall from screening mammography.

EXAM:
2D DIGITAL DIAGNOSTIC LEFT MAMMOGRAM WITH ADJUNCT TOMO
ULTRASOUND LEFT BREAST

[Series 1: ultrasound left breast limited · 0.07mm/px · 13 of 13 slices shown]
[im 1/13]
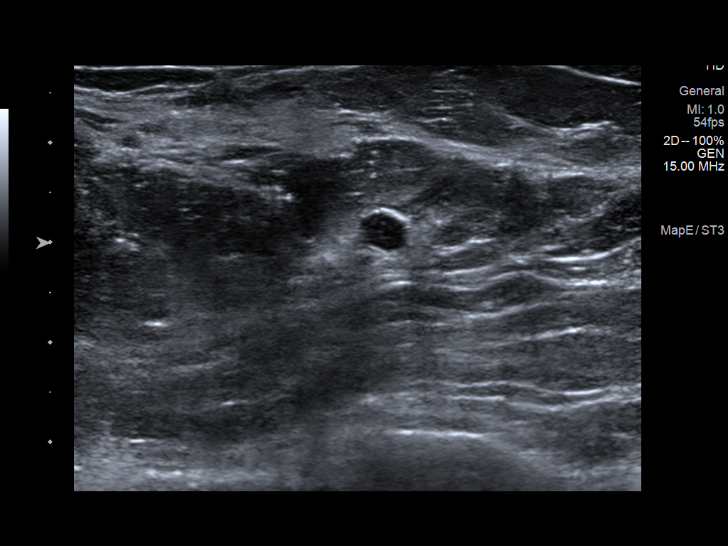
[im 2/13]
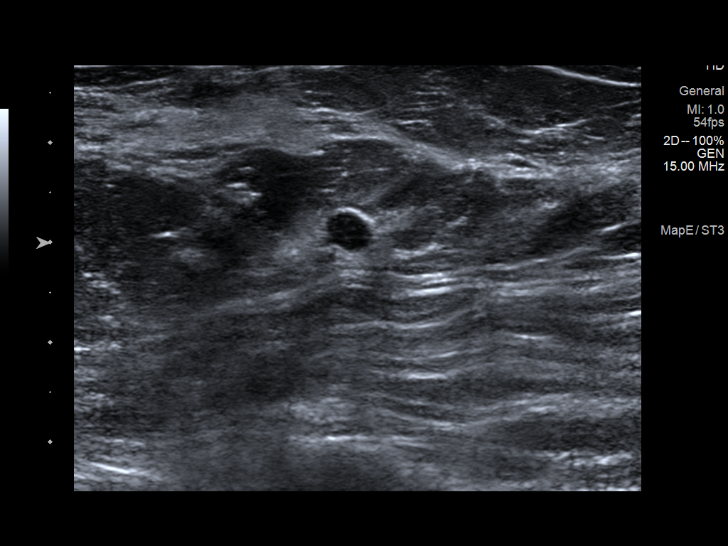
[im 3/13]
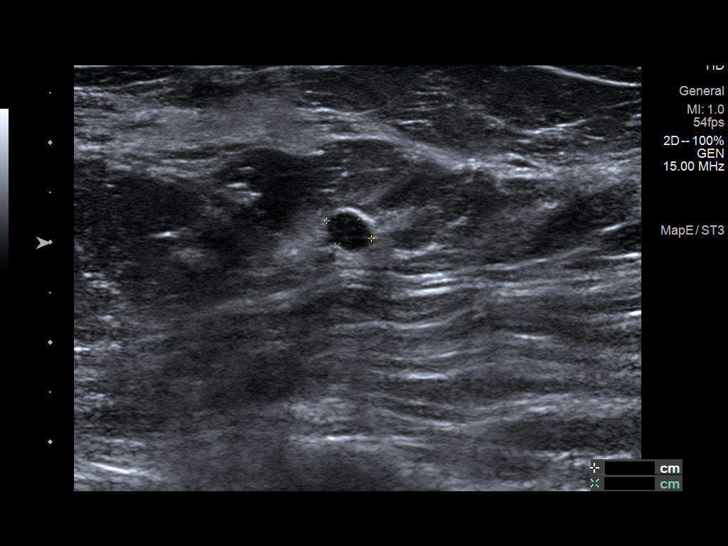
[im 4/13]
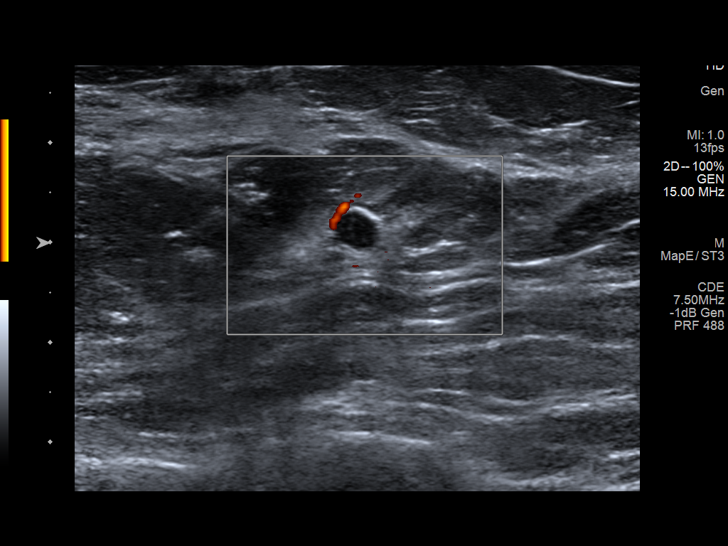
[im 5/13]
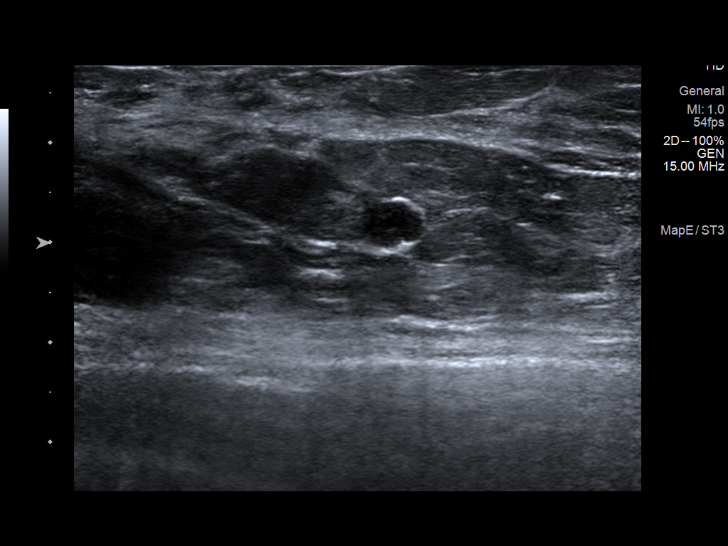
[im 6/13]
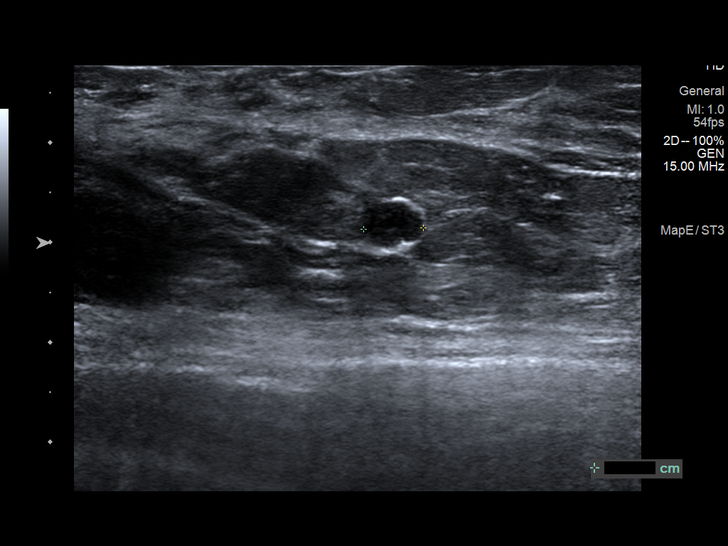
[im 7/13]
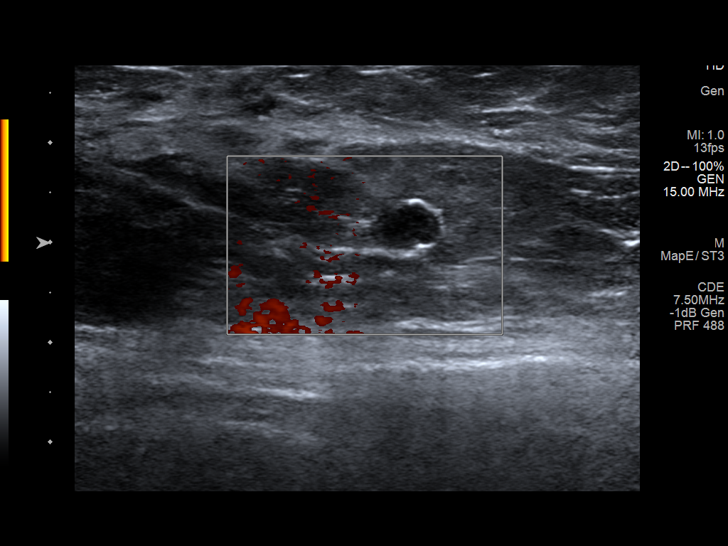
[im 8/13]
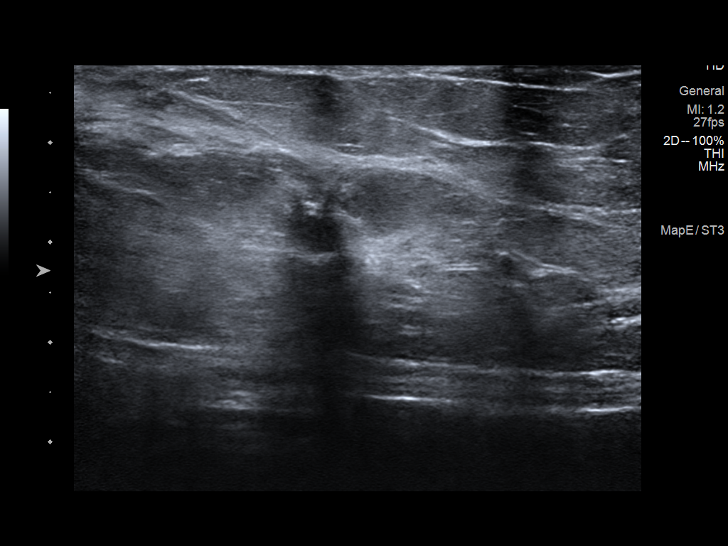
[im 9/13]
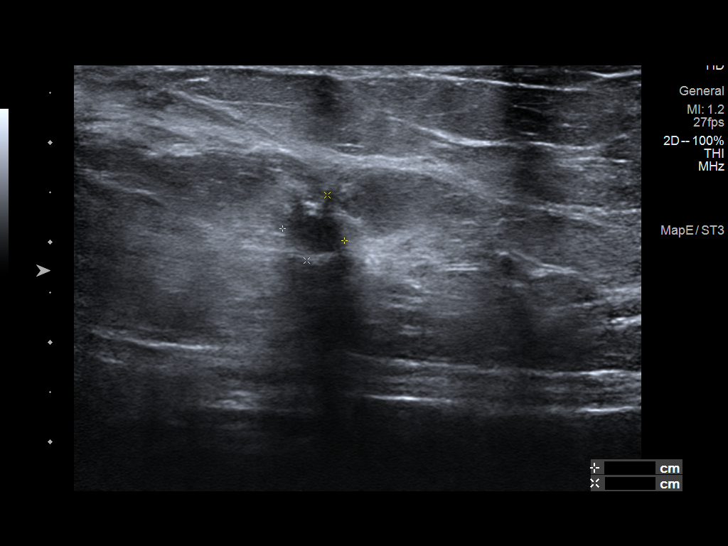
[im 10/13]
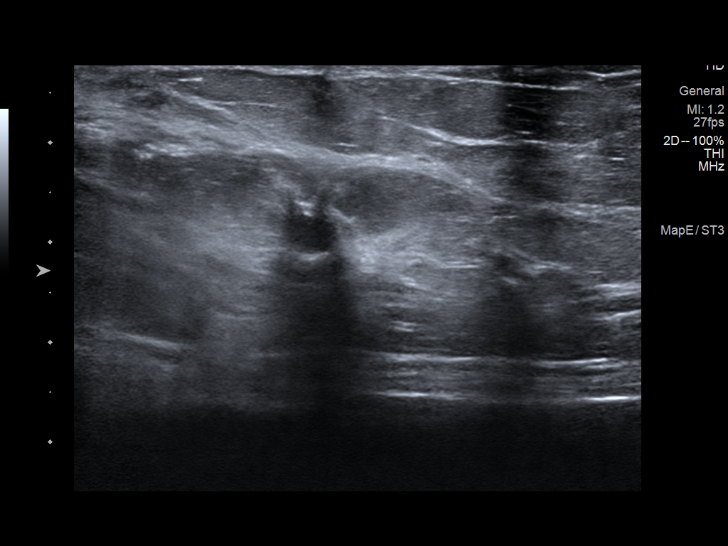
[im 11/13]
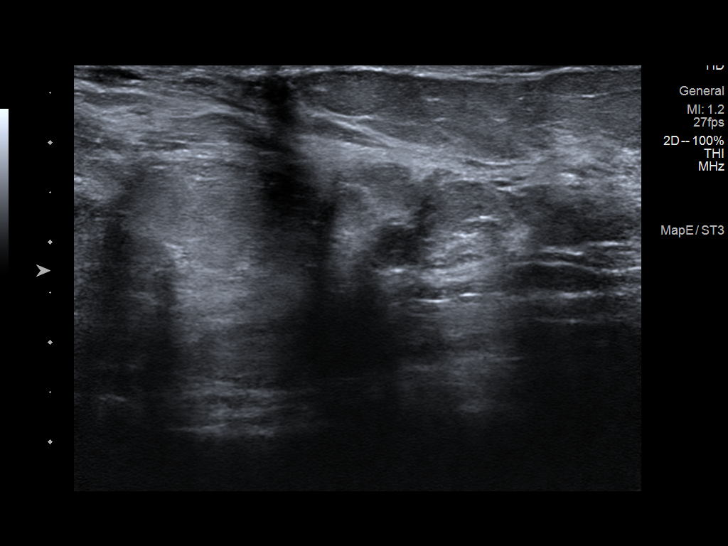
[im 12/13]
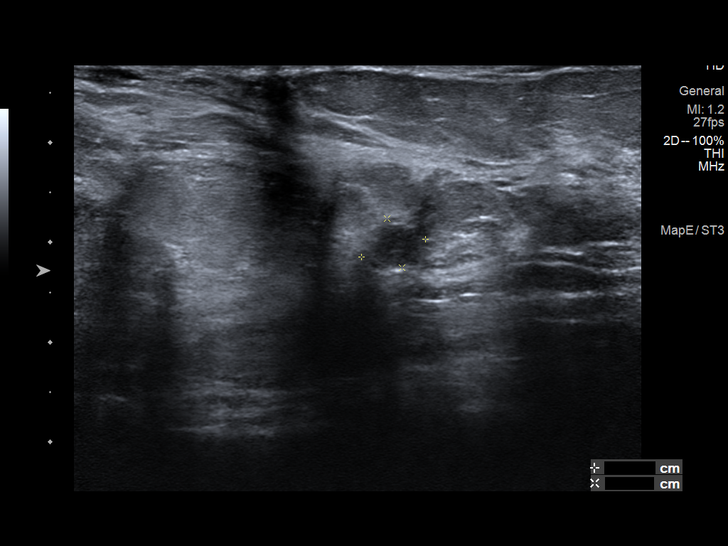
[im 13/13]
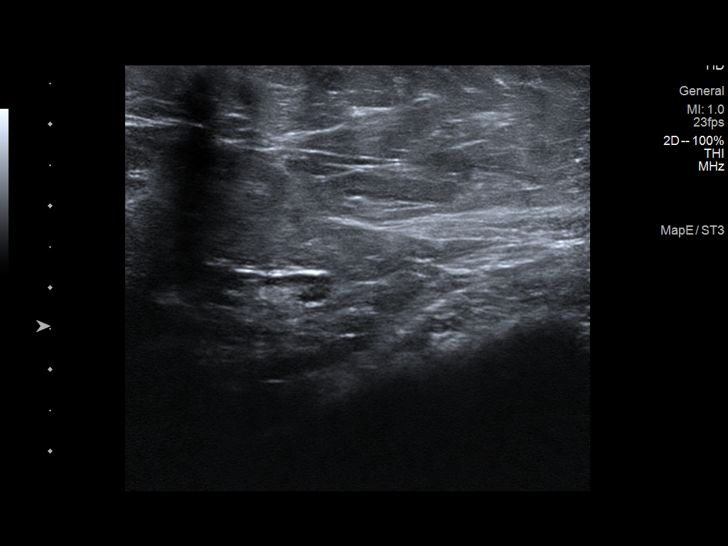

[13 of 13 positions shown; findings below may reference images not displayed]

ACR Breast Density Category b: There are scattered areas of
fibroglandular density.
FINDINGS: Additional views of the left breast with tomosynthesis demonstrate a
small, irregular lesion to be present within the lower inner
quadrant of the left breast at 5 o'clock position approximately 2 cm
from the nipple.

On physical exam, there is no discrete palpable abnormality in the
area of mammographic concern.

Targeted ultrasound is performed, showing an irregularly marginated
hypoechoic lesion with low-level internal echoes located within the
left breast at the 5 o'clock position 2 cm from the nipple measuring
7 x 7 x 6 mm in size. Tissue sampling via ultrasound-guided core
biopsy is recommended. I have discussed this with the patient. This
will be scheduled.

Ultrasound of the left axilla demonstrates normal axillary contents
and no evidence for adenopathy.
IMPRESSION: 7 mm mildly irregular hypoechoic mass located within the left breast
at 5 o'clock position 2 cm from the nipple. Ultrasound-guided core
biopsy is recommended and will be scheduled.

RECOMMENDATION:
Left breast ultrasound-guided core biopsy.

I have discussed the findings and recommendations with the patient.
Results were also provided in writing at the conclusion of the
visit. If applicable, a reminder letter will be sent to the patient
regarding the next appointment.

BI-RADS CATEGORY  4: Suspicious.

## 2015-09-26 IMAGING — MG 2D DIGITAL DIAGNOSTIC UNILATERAL LEFT MAMMOGRAM WITH CAD AND ADJ
6 series · 6 of 18 positions shown · non-contrast
Comparison: Comparison [DATE], [DATE], [DATE],
[DATE], [DATE].

CLINICAL DATA: Recall from screening mammography.

EXAM:
2D DIGITAL DIAGNOSTIC LEFT MAMMOGRAM WITH ADJUNCT TOMO
ULTRASOUND LEFT BREAST

[L MLO (1 of 2)]
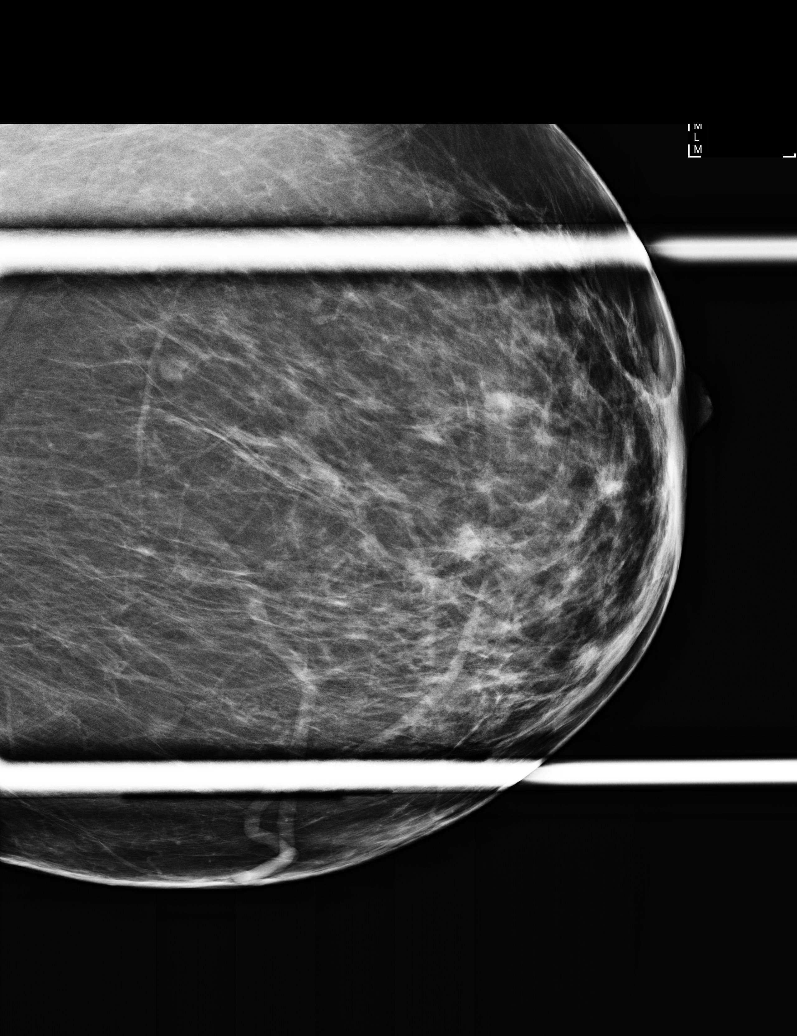

[L MLO (2 of 2)]
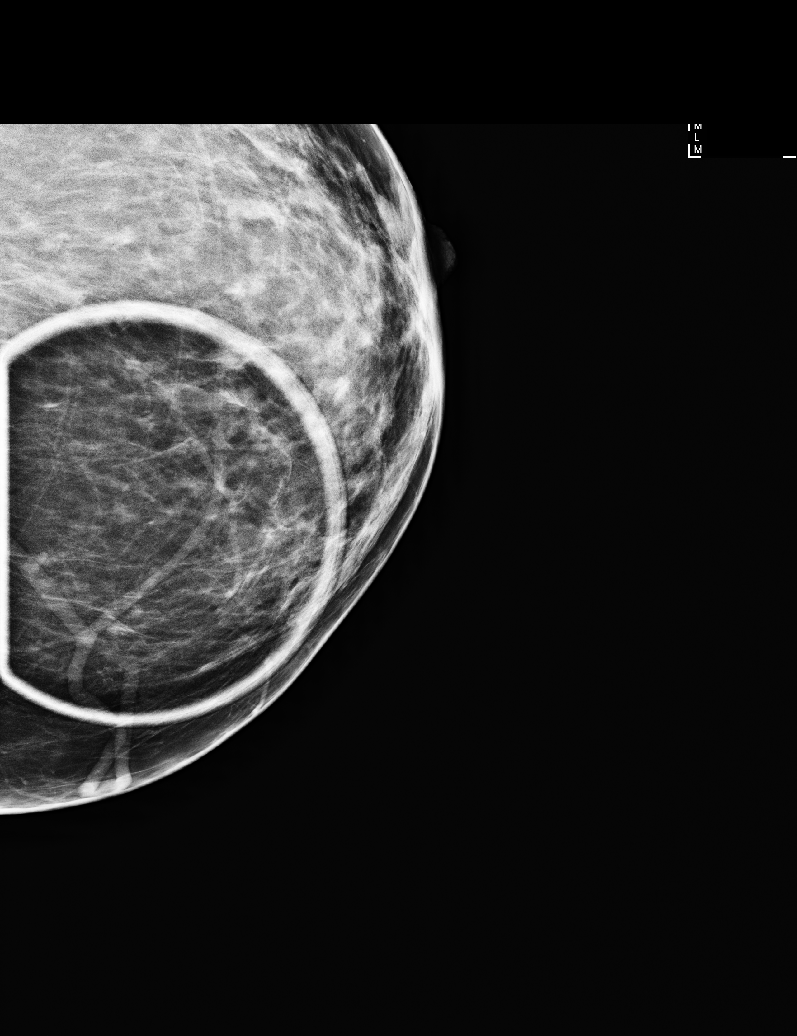

[L CC]
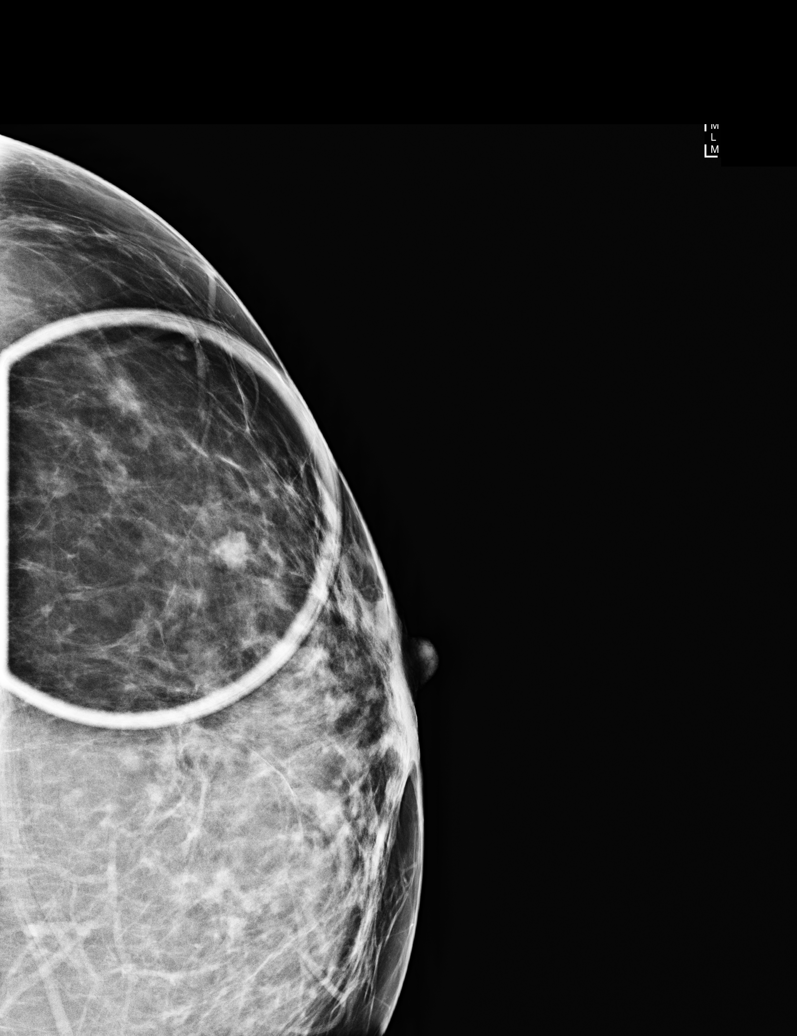

[L MLO tomo (1 of 2) · tomo slice 41/82.0]
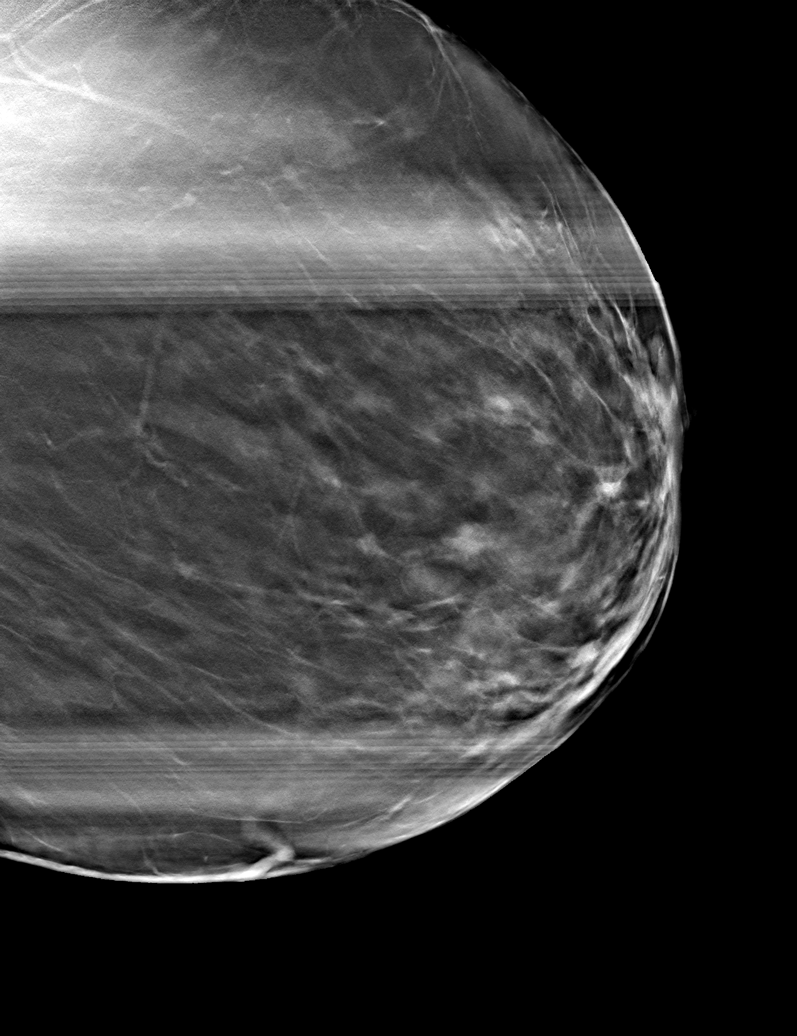

[L CC tomo · tomo slice 35/68.0]
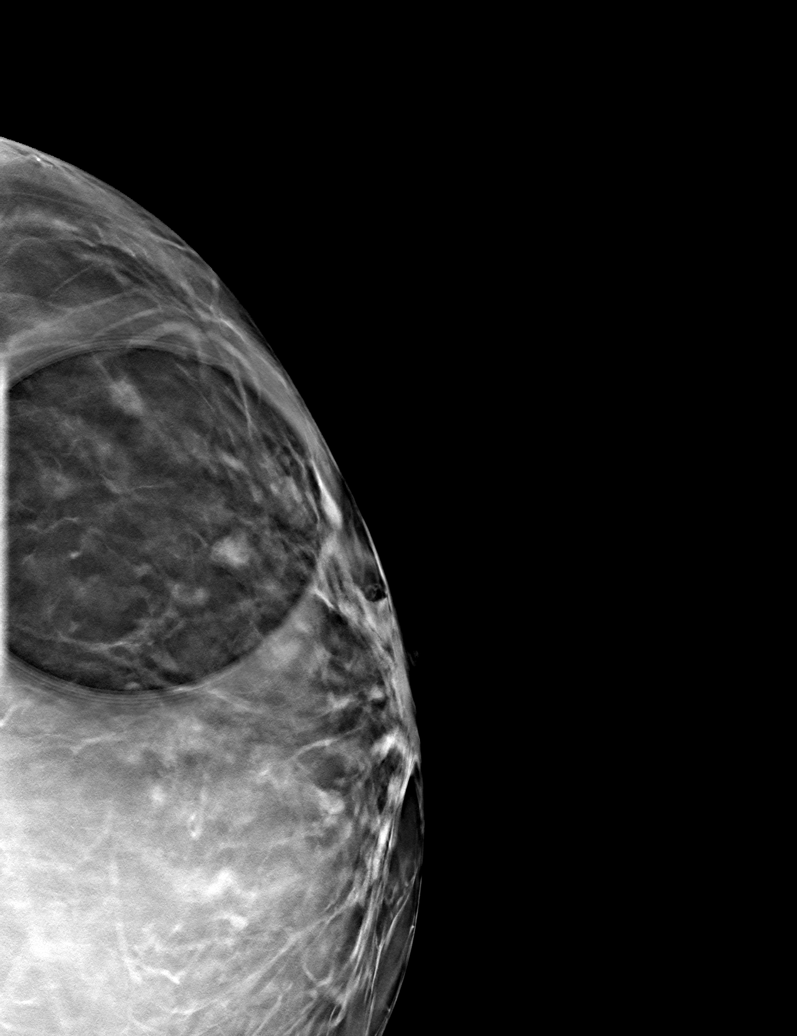

[L MLO tomo (2 of 2) · tomo slice 37/74.0]
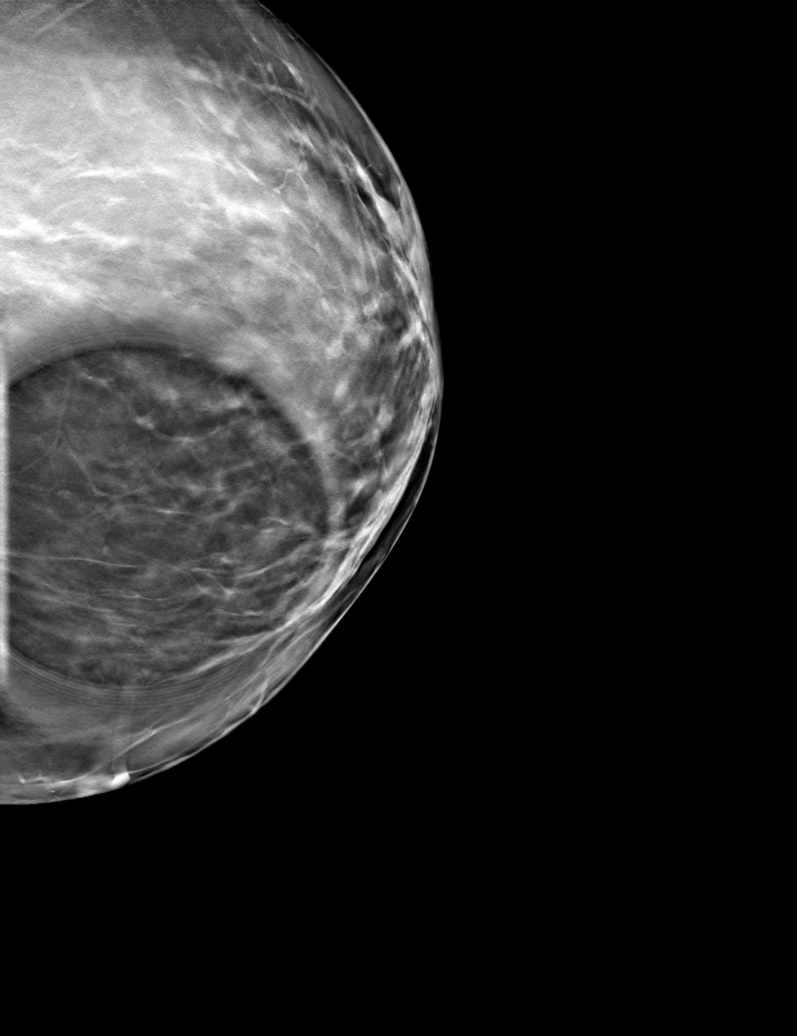

[6 of 18 positions shown; findings below may reference images not displayed]

ACR Breast Density Category b: There are scattered areas of
fibroglandular density.
FINDINGS: Additional views of the left breast with tomosynthesis demonstrate a
small, irregular lesion to be present within the lower inner
quadrant of the left breast at 5 o'clock position approximately 2 cm
from the nipple.

On physical exam, there is no discrete palpable abnormality in the
area of mammographic concern.

Targeted ultrasound is performed, showing an irregularly marginated
hypoechoic lesion with low-level internal echoes located within the
left breast at the 5 o'clock position 2 cm from the nipple measuring
7 x 7 x 6 mm in size. Tissue sampling via ultrasound-guided core
biopsy is recommended. I have discussed this with the patient. This
will be scheduled.

Ultrasound of the left axilla demonstrates normal axillary contents
and no evidence for adenopathy.
IMPRESSION: 7 mm mildly irregular hypoechoic mass located within the left breast
at 5 o'clock position 2 cm from the nipple. Ultrasound-guided core
biopsy is recommended and will be scheduled.

RECOMMENDATION:
Left breast ultrasound-guided core biopsy.

I have discussed the findings and recommendations with the patient.
Results were also provided in writing at the conclusion of the
visit. If applicable, a reminder letter will be sent to the patient
regarding the next appointment.

BI-RADS CATEGORY  4: Suspicious.

## 2015-09-27 ENCOUNTER — Other Ambulatory Visit: Payer: Self-pay | Admitting: Obstetrics and Gynecology

## 2015-09-27 DIAGNOSIS — N63 Unspecified lump in unspecified breast: Secondary | ICD-10-CM

## 2015-09-28 ENCOUNTER — Ambulatory Visit
Admission: RE | Admit: 2015-09-28 | Discharge: 2015-09-28 | Disposition: A | Discharge status: Home / Self Care | Payer: Medicare Other | Source: Ambulatory Visit | Attending: Obstetrics and Gynecology | Admitting: Obstetrics and Gynecology

## 2015-09-28 DIAGNOSIS — N6012 Diffuse cystic mastopathy of left breast: Secondary | ICD-10-CM | POA: Diagnosis not present

## 2015-09-28 DIAGNOSIS — N63 Unspecified lump in unspecified breast: Secondary | ICD-10-CM

## 2015-09-28 IMAGING — MG DIGITAL DIAGNOSTIC UNILATERAL LEFT MAMMOGRAM
2 series · 2 of 2 positions shown · non-contrast
Comparison: Previous exam(s).

CLINICAL DATA: Patient is post ultrasound-guided core needle biopsy
of a 7 mm mass over the 5 o'clock position of the left breast.

EXAM:
DIAGNOSTIC LEFT MAMMOGRAM POST ULTRASOUND BIOPSY

[L CC]
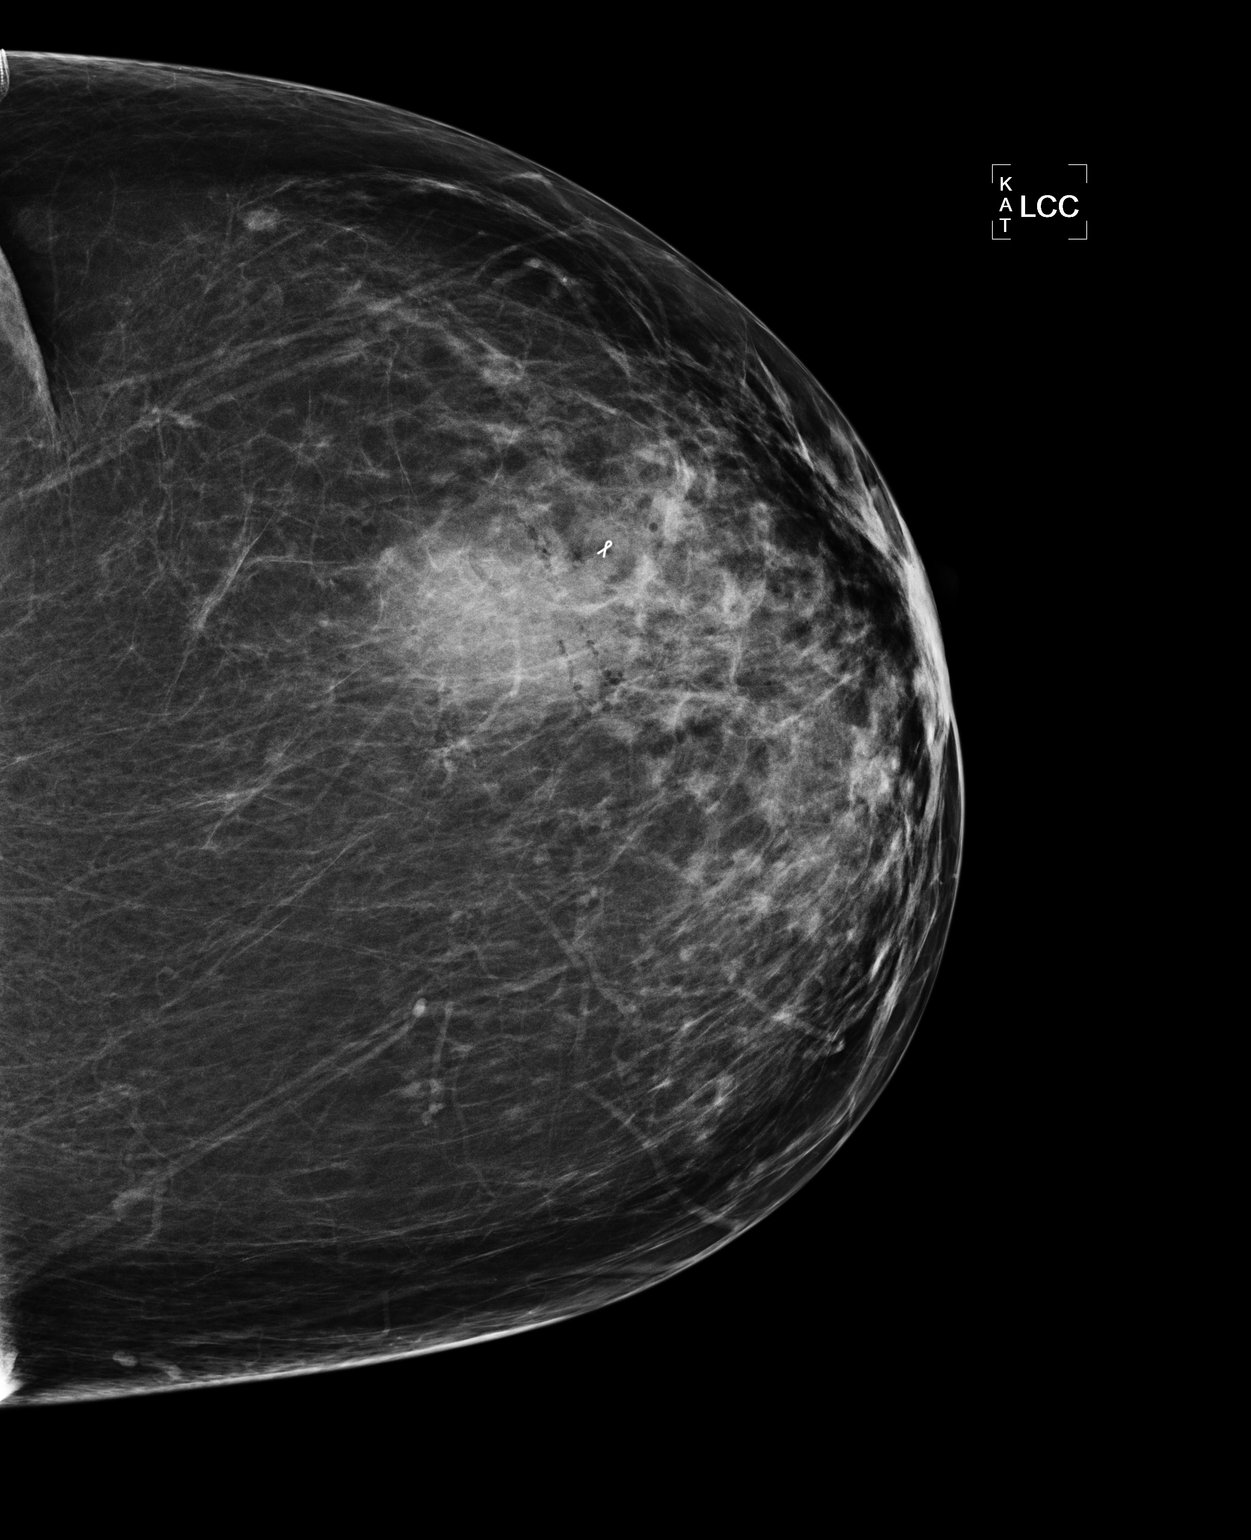

[L ML]
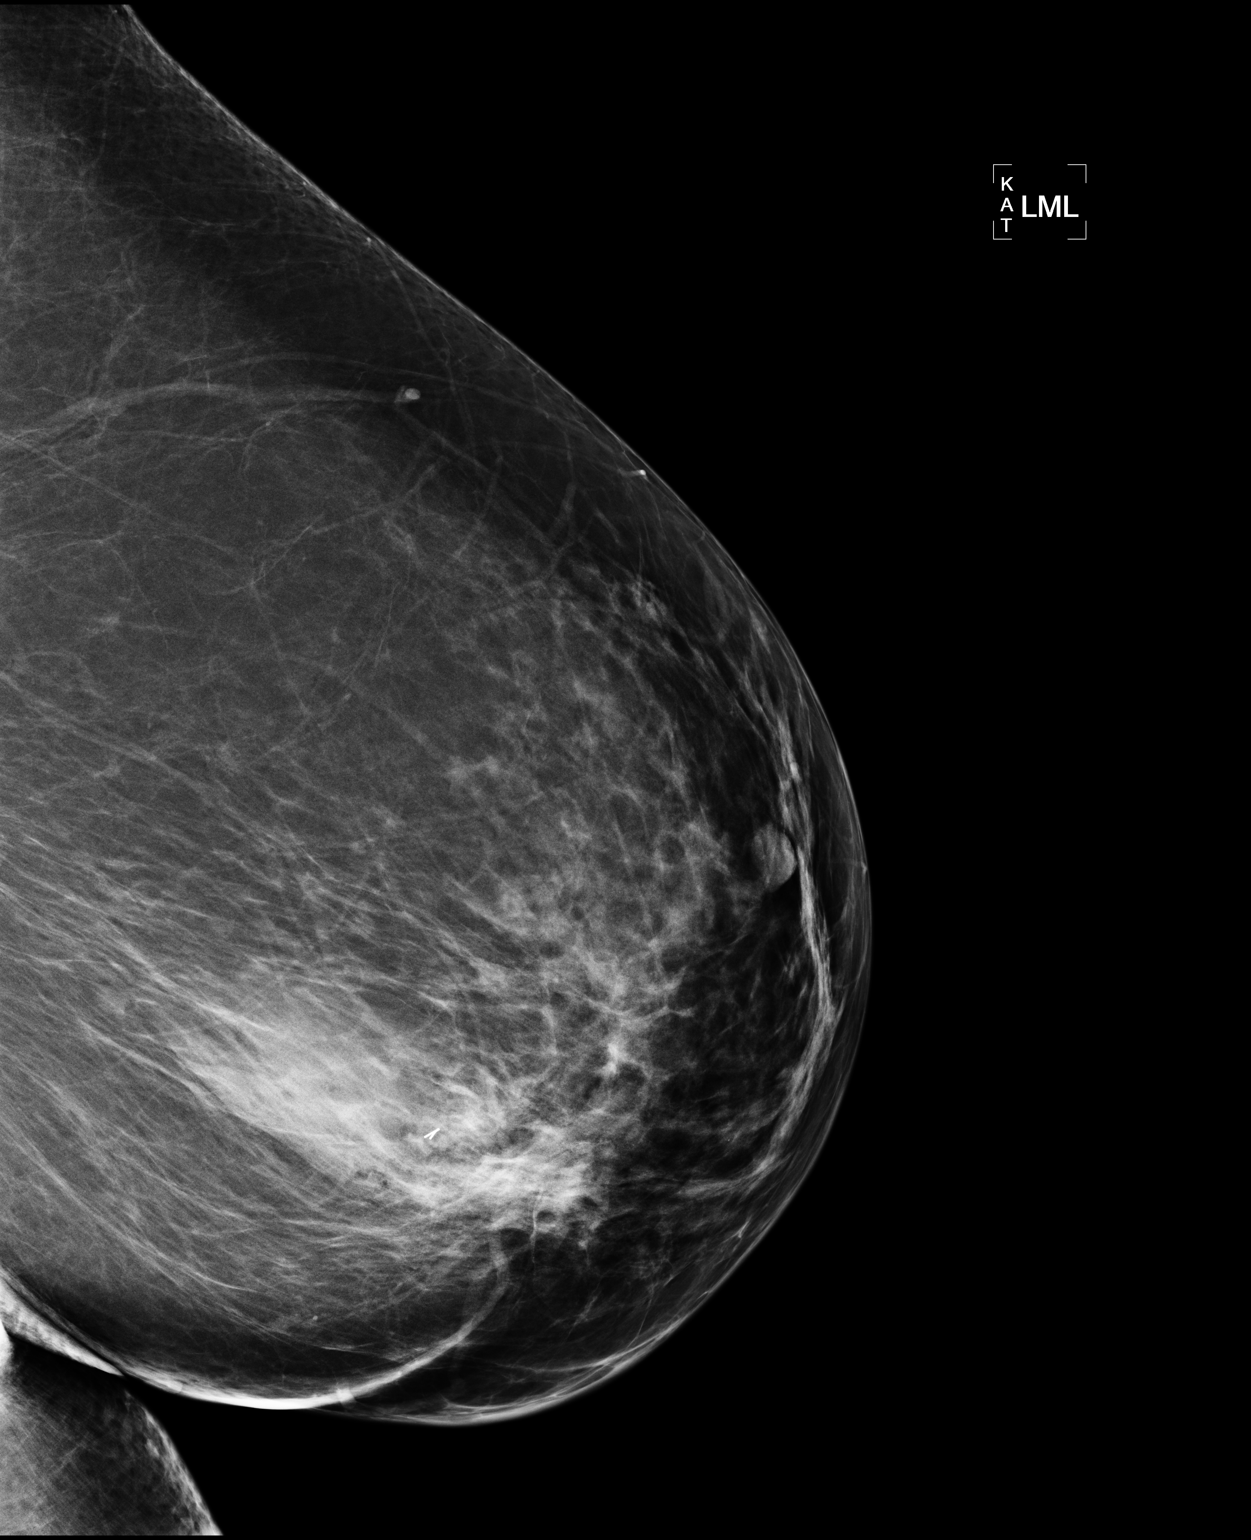

[2 of 2 positions shown; findings below may reference images not displayed]

FINDINGS: Mammographic images were obtained following ultrasound guided biopsy
of of the targeted 7 mm mass at the 5 o'clock position of the left
breast 2 cm from the nipple. Examination demonstrates a ribbon
shaped metallic clip over the biopsy site in outer lower left breast
in adequate position likely along the posterior aspect of the
biopsied mass. The biopsied mass is obscured due to mild to moderate
hemorrhage.
IMPRESSION: Satisfactory placement of ribbon shaped metallic clip post
ultrasound core biopsy left breast mass at the 5 o'clock position.

Final Assessment: Post Procedure Mammograms for Marker Placement

## 2016-03-26 DIAGNOSIS — L578 Other skin changes due to chronic exposure to nonionizing radiation: Secondary | ICD-10-CM | POA: Diagnosis not present

## 2016-03-26 DIAGNOSIS — D225 Melanocytic nevi of trunk: Secondary | ICD-10-CM | POA: Diagnosis not present

## 2016-03-26 DIAGNOSIS — L821 Other seborrheic keratosis: Secondary | ICD-10-CM | POA: Diagnosis not present

## 2016-03-26 DIAGNOSIS — L570 Actinic keratosis: Secondary | ICD-10-CM | POA: Diagnosis not present

## 2016-05-02 DIAGNOSIS — I10 Essential (primary) hypertension: Secondary | ICD-10-CM | POA: Diagnosis not present

## 2016-05-02 DIAGNOSIS — E785 Hyperlipidemia, unspecified: Secondary | ICD-10-CM | POA: Diagnosis not present

## 2016-05-02 DIAGNOSIS — Z79899 Other long term (current) drug therapy: Secondary | ICD-10-CM | POA: Diagnosis not present

## 2016-05-02 DIAGNOSIS — R7309 Other abnormal glucose: Secondary | ICD-10-CM | POA: Diagnosis not present

## 2016-05-02 DIAGNOSIS — K219 Gastro-esophageal reflux disease without esophagitis: Secondary | ICD-10-CM | POA: Diagnosis not present

## 2016-06-11 DIAGNOSIS — L570 Actinic keratosis: Secondary | ICD-10-CM | POA: Diagnosis not present

## 2016-09-01 DIAGNOSIS — H04123 Dry eye syndrome of bilateral lacrimal glands: Secondary | ICD-10-CM | POA: Diagnosis not present

## 2016-09-01 DIAGNOSIS — H25813 Combined forms of age-related cataract, bilateral: Secondary | ICD-10-CM | POA: Diagnosis not present

## 2016-10-09 DIAGNOSIS — Z01419 Encounter for gynecological examination (general) (routine) without abnormal findings: Secondary | ICD-10-CM | POA: Diagnosis not present

## 2016-10-09 DIAGNOSIS — Z1231 Encounter for screening mammogram for malignant neoplasm of breast: Secondary | ICD-10-CM | POA: Diagnosis not present

## 2016-10-09 DIAGNOSIS — Z6830 Body mass index (BMI) 30.0-30.9, adult: Secondary | ICD-10-CM | POA: Diagnosis not present

## 2016-10-27 DIAGNOSIS — H25811 Combined forms of age-related cataract, right eye: Secondary | ICD-10-CM | POA: Diagnosis not present

## 2016-10-27 DIAGNOSIS — H25813 Combined forms of age-related cataract, bilateral: Secondary | ICD-10-CM | POA: Diagnosis not present

## 2016-10-27 DIAGNOSIS — H52201 Unspecified astigmatism, right eye: Secondary | ICD-10-CM | POA: Diagnosis not present

## 2016-10-27 DIAGNOSIS — H52203 Unspecified astigmatism, bilateral: Secondary | ICD-10-CM | POA: Diagnosis not present

## 2016-10-27 DIAGNOSIS — Z01818 Encounter for other preprocedural examination: Secondary | ICD-10-CM | POA: Diagnosis not present

## 2016-10-27 DIAGNOSIS — H2511 Age-related nuclear cataract, right eye: Secondary | ICD-10-CM | POA: Diagnosis not present

## 2016-10-31 DIAGNOSIS — G43109 Migraine with aura, not intractable, without status migrainosus: Secondary | ICD-10-CM | POA: Diagnosis not present

## 2016-10-31 DIAGNOSIS — Z6830 Body mass index (BMI) 30.0-30.9, adult: Secondary | ICD-10-CM | POA: Diagnosis not present

## 2016-10-31 DIAGNOSIS — I10 Essential (primary) hypertension: Secondary | ICD-10-CM | POA: Diagnosis not present

## 2016-10-31 DIAGNOSIS — K219 Gastro-esophageal reflux disease without esophagitis: Secondary | ICD-10-CM | POA: Diagnosis not present

## 2016-11-06 DIAGNOSIS — H52222 Regular astigmatism, left eye: Secondary | ICD-10-CM | POA: Diagnosis not present

## 2016-11-06 DIAGNOSIS — H25812 Combined forms of age-related cataract, left eye: Secondary | ICD-10-CM | POA: Diagnosis not present

## 2016-11-06 DIAGNOSIS — H52202 Unspecified astigmatism, left eye: Secondary | ICD-10-CM | POA: Diagnosis not present

## 2016-11-06 DIAGNOSIS — H2512 Age-related nuclear cataract, left eye: Secondary | ICD-10-CM | POA: Diagnosis not present

## 2016-12-02 DIAGNOSIS — Z23 Encounter for immunization: Secondary | ICD-10-CM | POA: Diagnosis not present

## 2016-12-02 DIAGNOSIS — Z961 Presence of intraocular lens: Secondary | ICD-10-CM | POA: Diagnosis not present

## 2017-03-25 DIAGNOSIS — G4700 Insomnia, unspecified: Secondary | ICD-10-CM | POA: Diagnosis not present

## 2017-03-25 DIAGNOSIS — Z6830 Body mass index (BMI) 30.0-30.9, adult: Secondary | ICD-10-CM | POA: Diagnosis not present

## 2017-03-25 DIAGNOSIS — S99922A Unspecified injury of left foot, initial encounter: Secondary | ICD-10-CM | POA: Diagnosis not present

## 2017-04-02 DIAGNOSIS — D225 Melanocytic nevi of trunk: Secondary | ICD-10-CM | POA: Diagnosis not present

## 2017-04-02 DIAGNOSIS — Z85820 Personal history of malignant melanoma of skin: Secondary | ICD-10-CM | POA: Diagnosis not present

## 2017-04-02 DIAGNOSIS — L820 Inflamed seborrheic keratosis: Secondary | ICD-10-CM | POA: Diagnosis not present

## 2017-04-02 DIAGNOSIS — D485 Neoplasm of uncertain behavior of skin: Secondary | ICD-10-CM | POA: Diagnosis not present

## 2017-04-08 DIAGNOSIS — I10 Essential (primary) hypertension: Secondary | ICD-10-CM | POA: Diagnosis not present

## 2017-04-08 DIAGNOSIS — G4700 Insomnia, unspecified: Secondary | ICD-10-CM | POA: Diagnosis not present

## 2017-04-08 DIAGNOSIS — S99922D Unspecified injury of left foot, subsequent encounter: Secondary | ICD-10-CM | POA: Diagnosis not present

## 2017-04-08 DIAGNOSIS — Z6830 Body mass index (BMI) 30.0-30.9, adult: Secondary | ICD-10-CM | POA: Diagnosis not present

## 2017-04-29 DIAGNOSIS — Z6830 Body mass index (BMI) 30.0-30.9, adult: Secondary | ICD-10-CM | POA: Diagnosis not present

## 2017-04-29 DIAGNOSIS — R5383 Other fatigue: Secondary | ICD-10-CM | POA: Diagnosis not present

## 2017-04-29 DIAGNOSIS — R110 Nausea: Secondary | ICD-10-CM | POA: Diagnosis not present

## 2017-04-29 DIAGNOSIS — R0789 Other chest pain: Secondary | ICD-10-CM | POA: Diagnosis not present

## 2017-04-29 DIAGNOSIS — R1013 Epigastric pain: Secondary | ICD-10-CM | POA: Diagnosis not present

## 2017-05-15 DIAGNOSIS — I10 Essential (primary) hypertension: Secondary | ICD-10-CM | POA: Diagnosis not present

## 2017-05-15 DIAGNOSIS — Z6830 Body mass index (BMI) 30.0-30.9, adult: Secondary | ICD-10-CM | POA: Diagnosis not present

## 2017-05-15 DIAGNOSIS — S99922A Unspecified injury of left foot, initial encounter: Secondary | ICD-10-CM | POA: Diagnosis not present

## 2017-05-15 DIAGNOSIS — K219 Gastro-esophageal reflux disease without esophagitis: Secondary | ICD-10-CM | POA: Diagnosis not present

## 2017-09-08 DIAGNOSIS — J209 Acute bronchitis, unspecified: Secondary | ICD-10-CM | POA: Diagnosis not present

## 2017-09-08 DIAGNOSIS — G4700 Insomnia, unspecified: Secondary | ICD-10-CM | POA: Diagnosis not present

## 2017-09-08 DIAGNOSIS — Z6830 Body mass index (BMI) 30.0-30.9, adult: Secondary | ICD-10-CM | POA: Diagnosis not present

## 2017-09-15 DIAGNOSIS — N3001 Acute cystitis with hematuria: Secondary | ICD-10-CM | POA: Diagnosis not present

## 2017-09-15 DIAGNOSIS — Z6830 Body mass index (BMI) 30.0-30.9, adult: Secondary | ICD-10-CM | POA: Diagnosis not present

## 2017-09-21 DIAGNOSIS — Z961 Presence of intraocular lens: Secondary | ICD-10-CM | POA: Diagnosis not present

## 2017-11-09 DIAGNOSIS — Z0000 Encounter for general adult medical examination without abnormal findings: Secondary | ICD-10-CM | POA: Diagnosis not present

## 2017-11-09 DIAGNOSIS — I10 Essential (primary) hypertension: Secondary | ICD-10-CM | POA: Diagnosis not present

## 2017-11-09 DIAGNOSIS — K219 Gastro-esophageal reflux disease without esophagitis: Secondary | ICD-10-CM | POA: Diagnosis not present

## 2017-11-09 DIAGNOSIS — R0982 Postnasal drip: Secondary | ICD-10-CM | POA: Diagnosis not present

## 2017-11-09 DIAGNOSIS — R7309 Other abnormal glucose: Secondary | ICD-10-CM | POA: Diagnosis not present

## 2017-11-09 DIAGNOSIS — Z23 Encounter for immunization: Secondary | ICD-10-CM | POA: Diagnosis not present

## 2017-11-09 DIAGNOSIS — J309 Allergic rhinitis, unspecified: Secondary | ICD-10-CM | POA: Diagnosis not present

## 2017-11-09 DIAGNOSIS — Z79899 Other long term (current) drug therapy: Secondary | ICD-10-CM | POA: Diagnosis not present

## 2017-12-31 DIAGNOSIS — J309 Allergic rhinitis, unspecified: Secondary | ICD-10-CM | POA: Diagnosis not present

## 2017-12-31 DIAGNOSIS — Z6830 Body mass index (BMI) 30.0-30.9, adult: Secondary | ICD-10-CM | POA: Diagnosis not present

## 2017-12-31 DIAGNOSIS — L659 Nonscarring hair loss, unspecified: Secondary | ICD-10-CM | POA: Diagnosis not present

## 2017-12-31 DIAGNOSIS — R0982 Postnasal drip: Secondary | ICD-10-CM | POA: Diagnosis not present

## 2018-01-13 DIAGNOSIS — D485 Neoplasm of uncertain behavior of skin: Secondary | ICD-10-CM | POA: Diagnosis not present

## 2018-01-13 DIAGNOSIS — L820 Inflamed seborrheic keratosis: Secondary | ICD-10-CM | POA: Diagnosis not present

## 2018-01-13 DIAGNOSIS — L821 Other seborrheic keratosis: Secondary | ICD-10-CM | POA: Diagnosis not present

## 2018-01-13 DIAGNOSIS — L578 Other skin changes due to chronic exposure to nonionizing radiation: Secondary | ICD-10-CM | POA: Diagnosis not present

## 2018-01-13 DIAGNOSIS — Z85820 Personal history of malignant melanoma of skin: Secondary | ICD-10-CM | POA: Diagnosis not present

## 2018-01-20 DIAGNOSIS — Z6830 Body mass index (BMI) 30.0-30.9, adult: Secondary | ICD-10-CM | POA: Diagnosis not present

## 2018-01-20 DIAGNOSIS — N958 Other specified menopausal and perimenopausal disorders: Secondary | ICD-10-CM | POA: Diagnosis not present

## 2018-01-20 DIAGNOSIS — M8588 Other specified disorders of bone density and structure, other site: Secondary | ICD-10-CM | POA: Diagnosis not present

## 2018-01-20 DIAGNOSIS — Z1231 Encounter for screening mammogram for malignant neoplasm of breast: Secondary | ICD-10-CM | POA: Diagnosis not present

## 2018-01-20 DIAGNOSIS — Z01419 Encounter for gynecological examination (general) (routine) without abnormal findings: Secondary | ICD-10-CM | POA: Diagnosis not present

## 2018-01-20 DIAGNOSIS — L659 Nonscarring hair loss, unspecified: Secondary | ICD-10-CM | POA: Diagnosis not present

## 2018-01-21 ENCOUNTER — Other Ambulatory Visit: Payer: Self-pay | Admitting: Obstetrics and Gynecology

## 2018-01-21 DIAGNOSIS — R928 Other abnormal and inconclusive findings on diagnostic imaging of breast: Secondary | ICD-10-CM

## 2018-01-25 ENCOUNTER — Ambulatory Visit
Admission: RE | Admit: 2018-01-25 | Discharge: 2018-01-25 | Disposition: A | Discharge status: Home / Self Care | Payer: Medicare Other | Source: Ambulatory Visit | Attending: Obstetrics and Gynecology | Admitting: Obstetrics and Gynecology

## 2018-01-25 ENCOUNTER — Other Ambulatory Visit: Payer: Self-pay | Admitting: Obstetrics and Gynecology

## 2018-01-25 DIAGNOSIS — R928 Other abnormal and inconclusive findings on diagnostic imaging of breast: Secondary | ICD-10-CM

## 2018-01-25 DIAGNOSIS — N6320 Unspecified lump in the left breast, unspecified quadrant: Secondary | ICD-10-CM | POA: Diagnosis not present

## 2018-01-25 IMAGING — MG DIGITAL DIAGNOSTIC UNILATERAL LEFT MAMMOGRAM WITH TOMO AND CAD
4 series · 4 of 12 positions shown · non-contrast
Comparison: Previous exam(s).

CLINICAL DATA: Screening recall for possible left breast mass.
Patient underwent biopsy of a breast mass in [DATE] revealed
concordant fibrocystic changes.

EXAM:
DIGITAL DIAGNOSTIC LEFT MAMMOGRAM WITH CAD AND TOMO
ULTRASOUND LEFT BREAST

[L CC synth-2D]
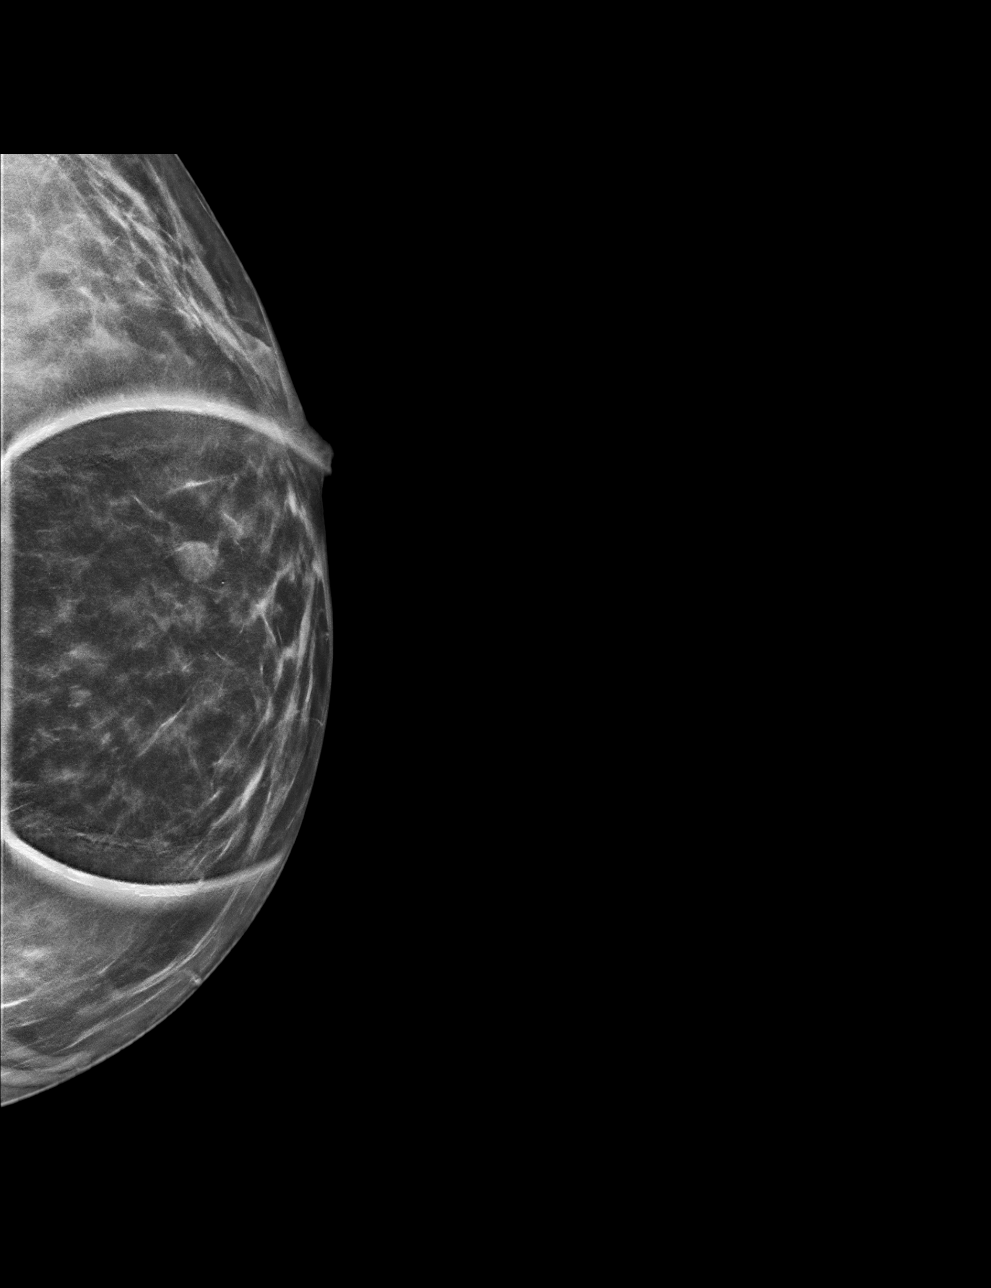

[L MLO synth-2D]
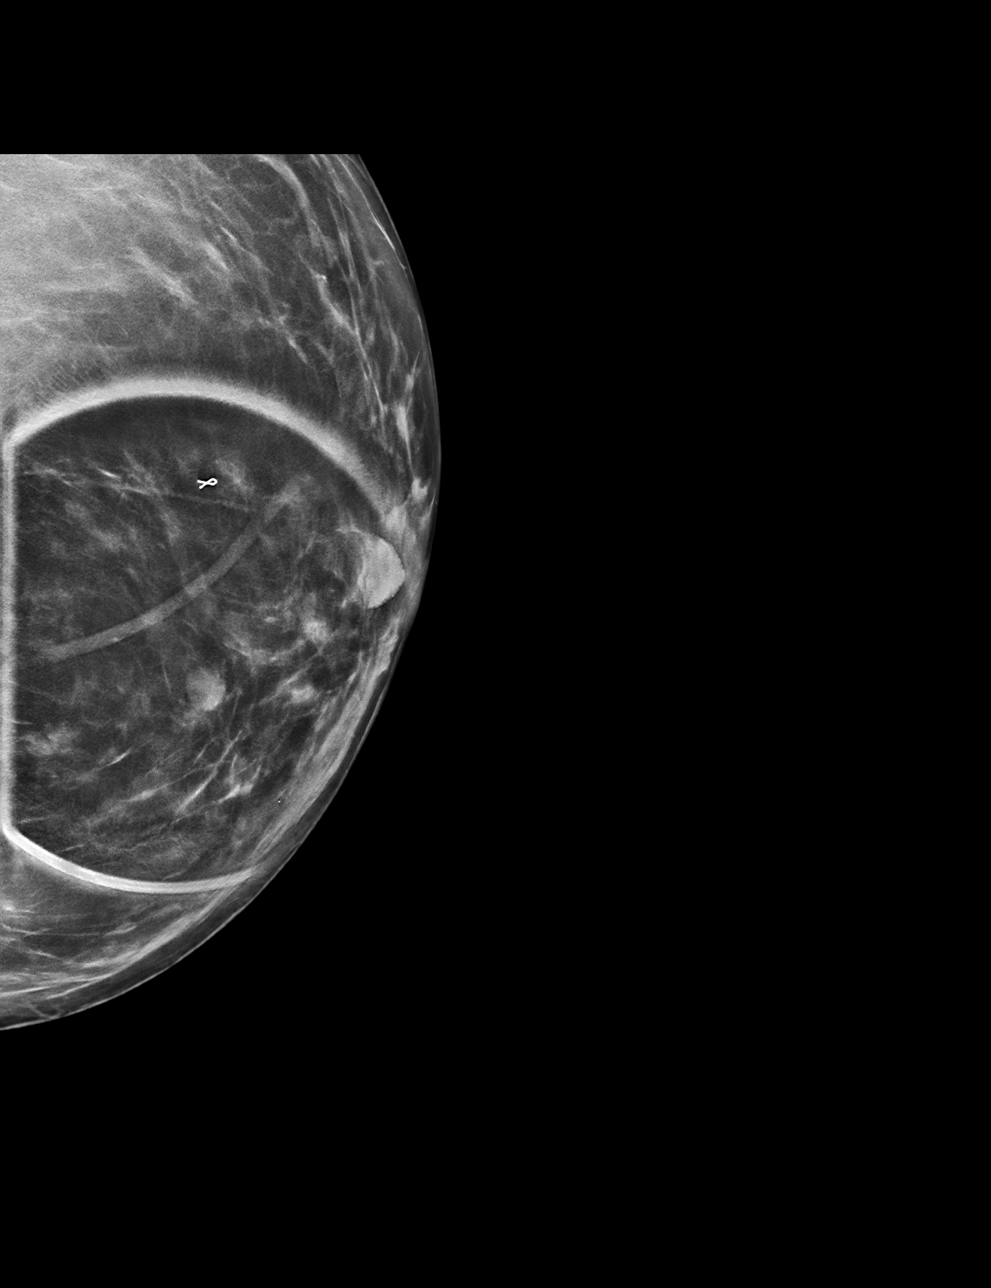

[L CC tomo · tomo slice 30/59.0]
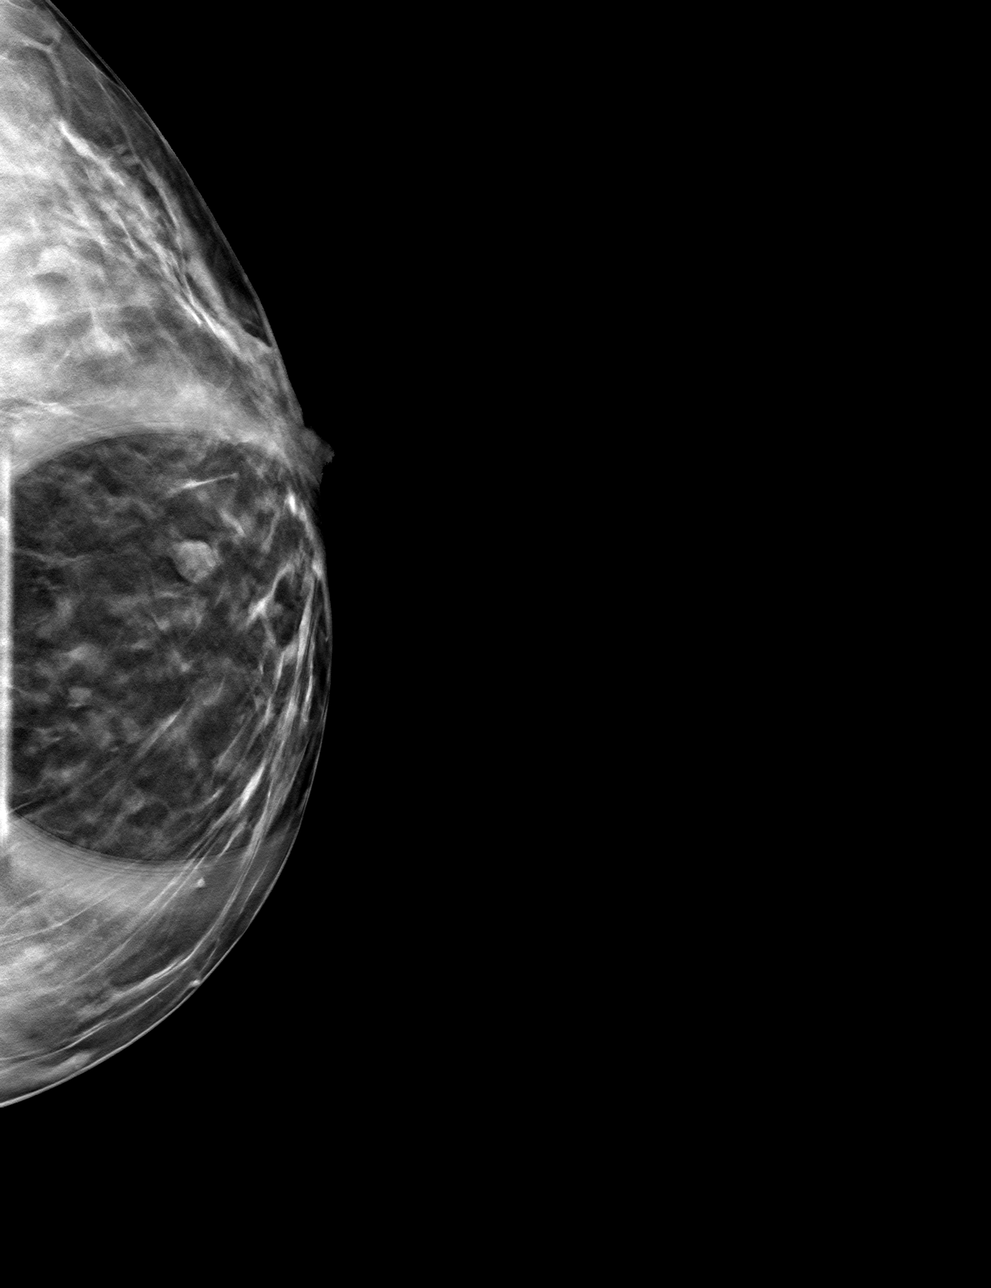

[L MLO tomo · tomo slice 37/74.0]
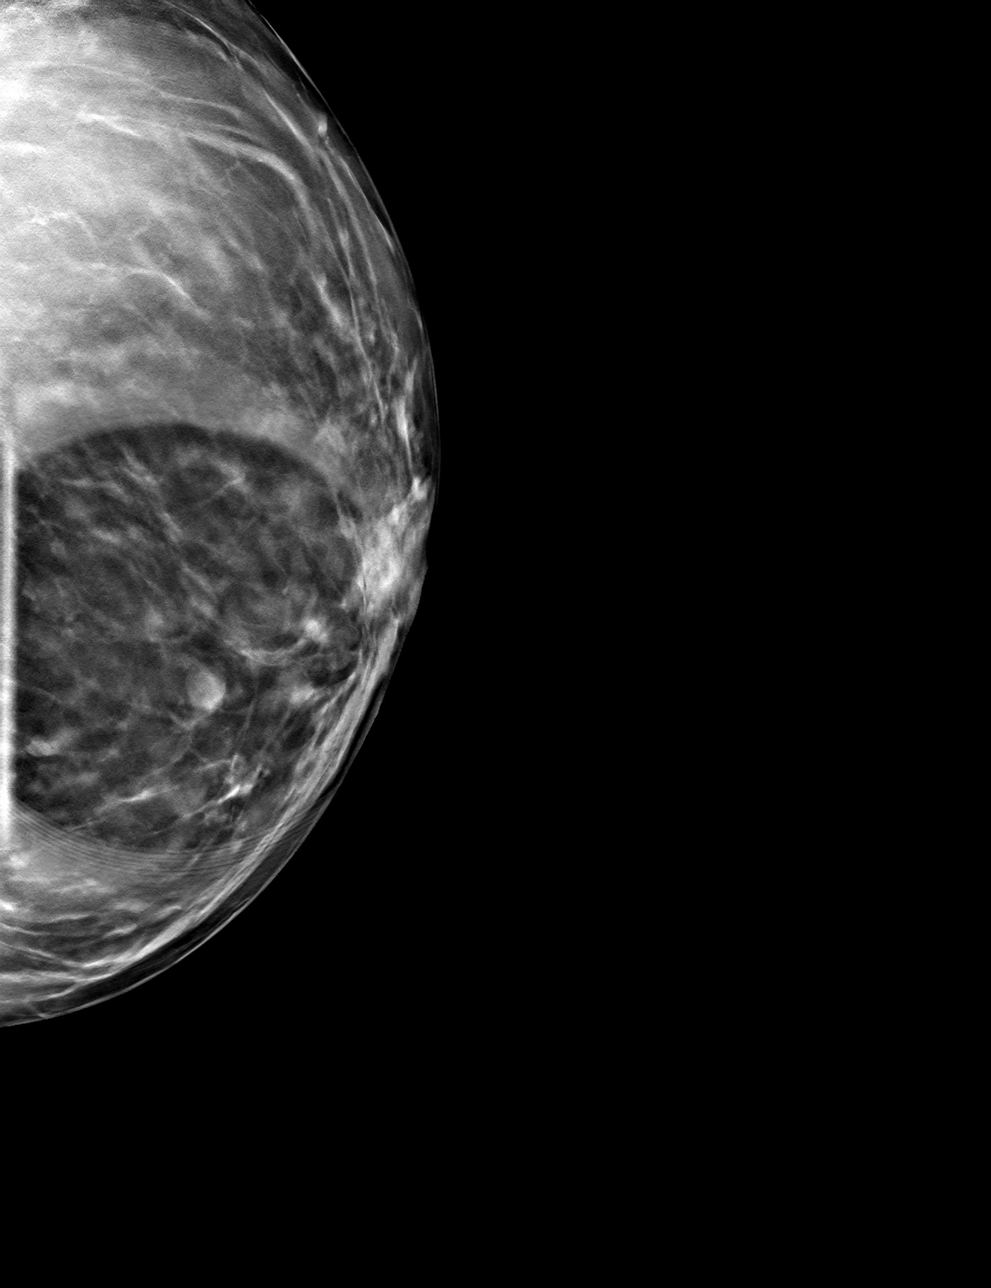

[4 of 12 positions shown; findings below may reference images not displayed]

ACR Breast Density Category b: There are scattered areas of
fibroglandular density.
FINDINGS: Possible mass noted in the anterior, upper, inner left breast,
persists on spot compression imaging. It is oval and mostly
circumscribed.

Mammographic images were processed with CAD.

Targeted ultrasound is performed, showing a round mass with internal
echoes and mostly circumscribed margins, measuring 6 mm in diameter.
There is no internal blood flow on color Doppler analysis. This mass
has a similar appearance to the mass biopsied in [DF].
IMPRESSION: 1. Probably benign mass left breast, most likely the same
fibrocystic pathology found on the left breast biopsy of the mass in
[DF]. Short-term follow-up recommended.

RECOMMENDATION:
Left breast ultrasound in 6 months.

I have discussed the findings and recommendations with the patient.
Results were also provided in writing at the conclusion of the
visit. If applicable, a reminder letter will be sent to the patient
regarding the next appointment.

BI-RADS CATEGORY  3: Probably benign.

## 2018-01-25 IMAGING — US ULTRASOUND LEFT BREAST LIMITED
1 series · 9 of 9 positions shown · non-contrast
Comparison: Previous exam(s).

CLINICAL DATA: Screening recall for possible left breast mass.
Patient underwent biopsy of a breast mass in [DATE] revealed
concordant fibrocystic changes.

EXAM:
DIGITAL DIAGNOSTIC LEFT MAMMOGRAM WITH CAD AND TOMO
ULTRASOUND LEFT BREAST

[Series 1: ultrasound left breast limited · 0.06mm/px · 9 of 9 slices shown]
[im 1/9]
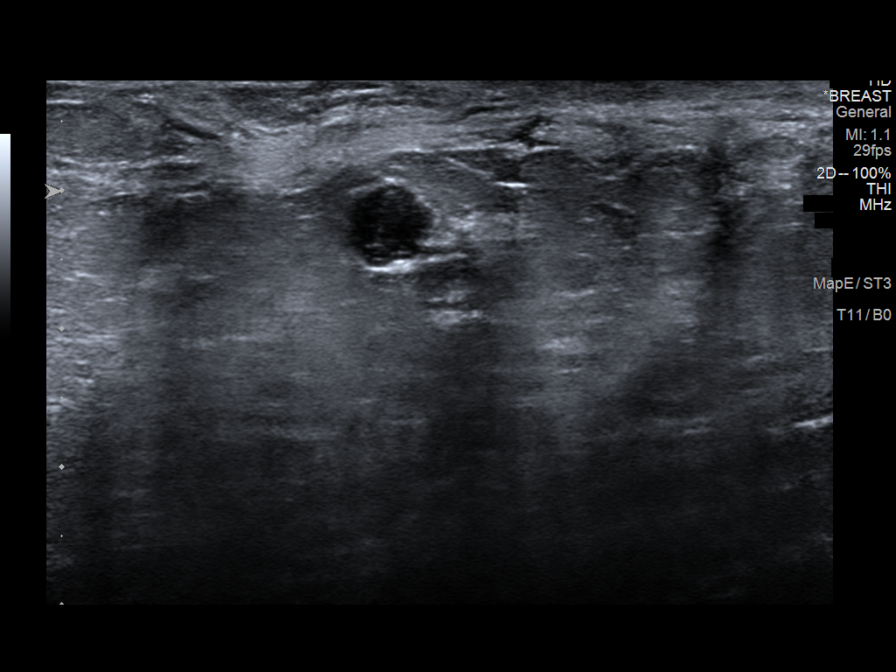
[im 2/9]
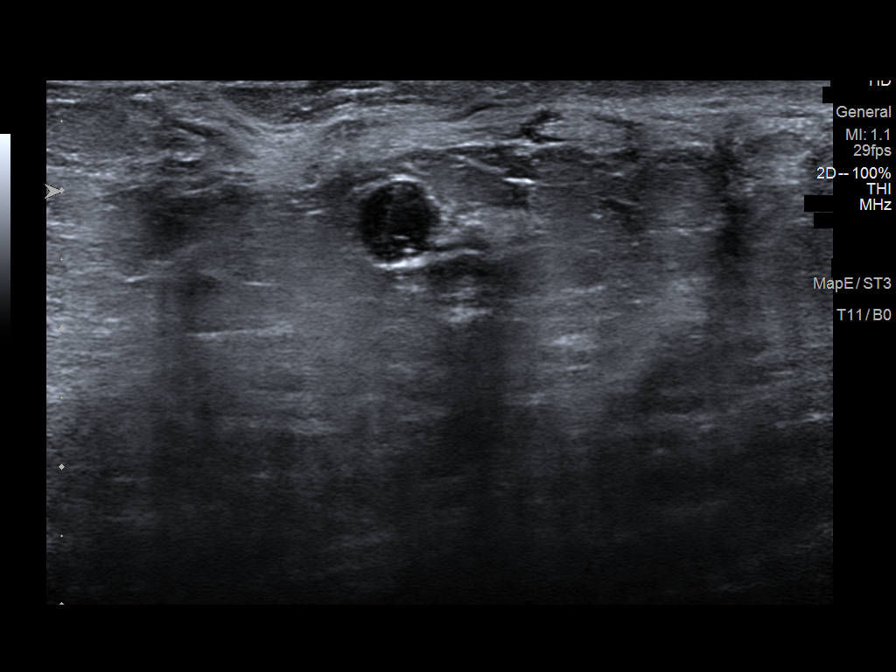
[im 3/9]
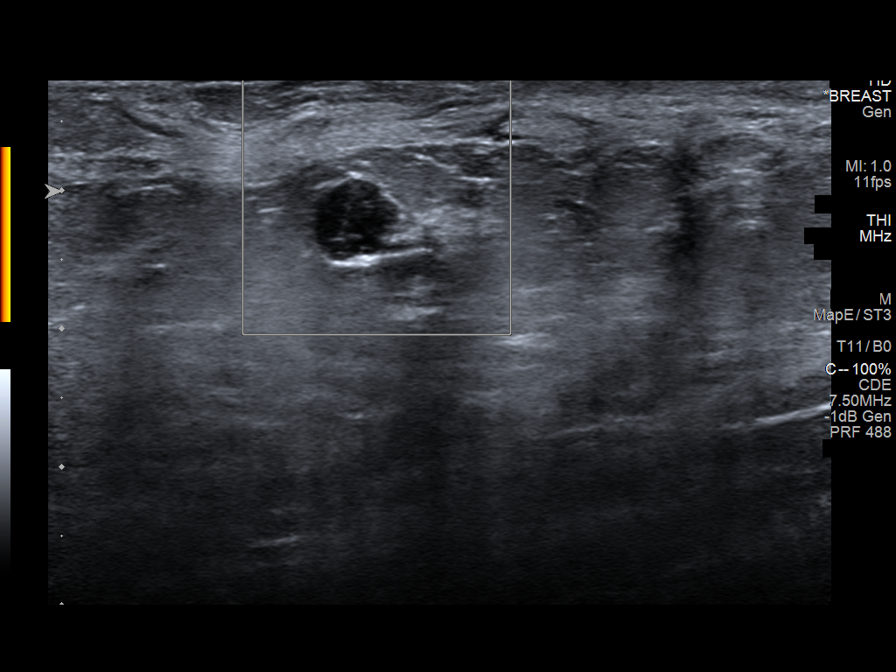
[im 4/9]
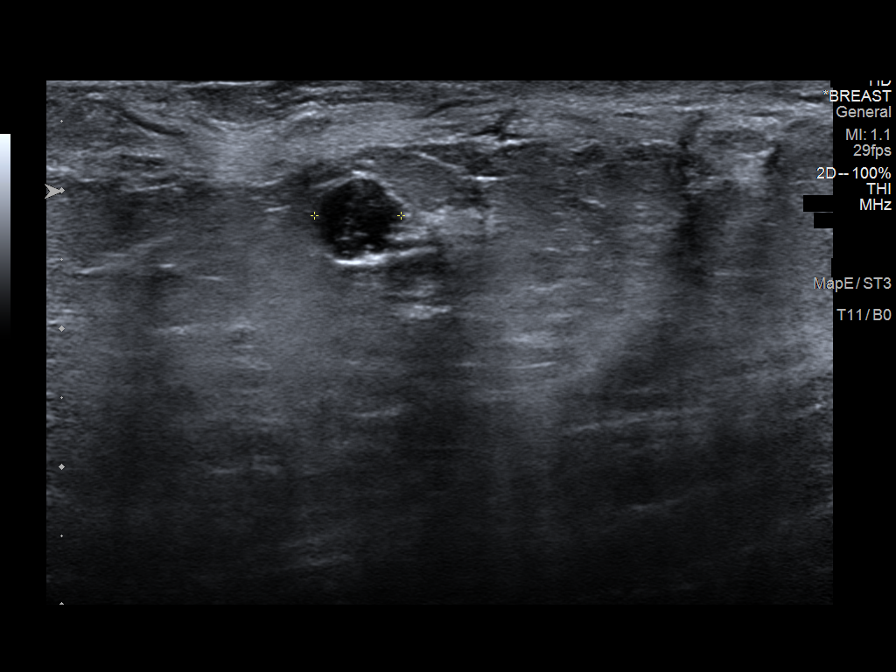
[im 5/9]
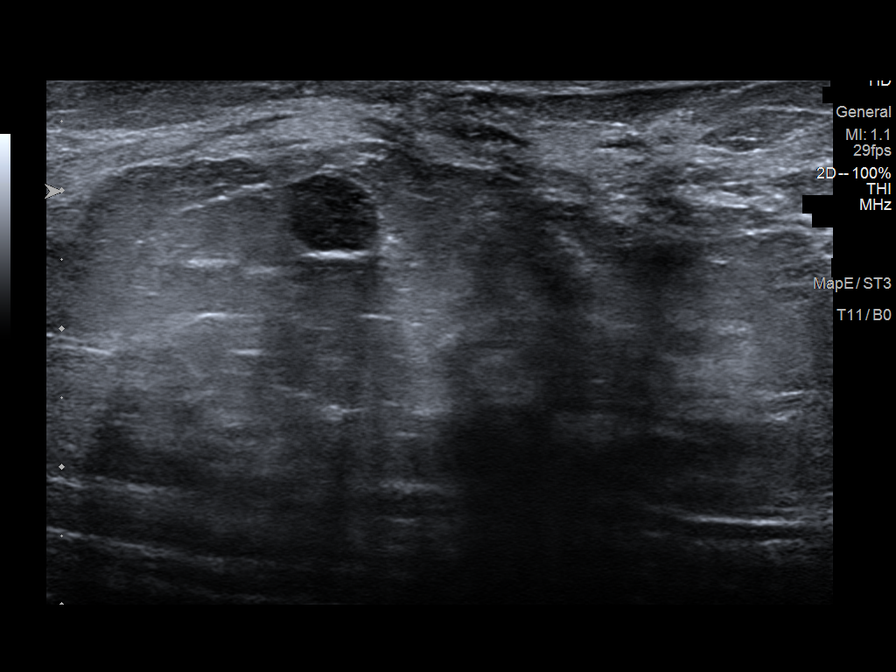
[im 6/9]
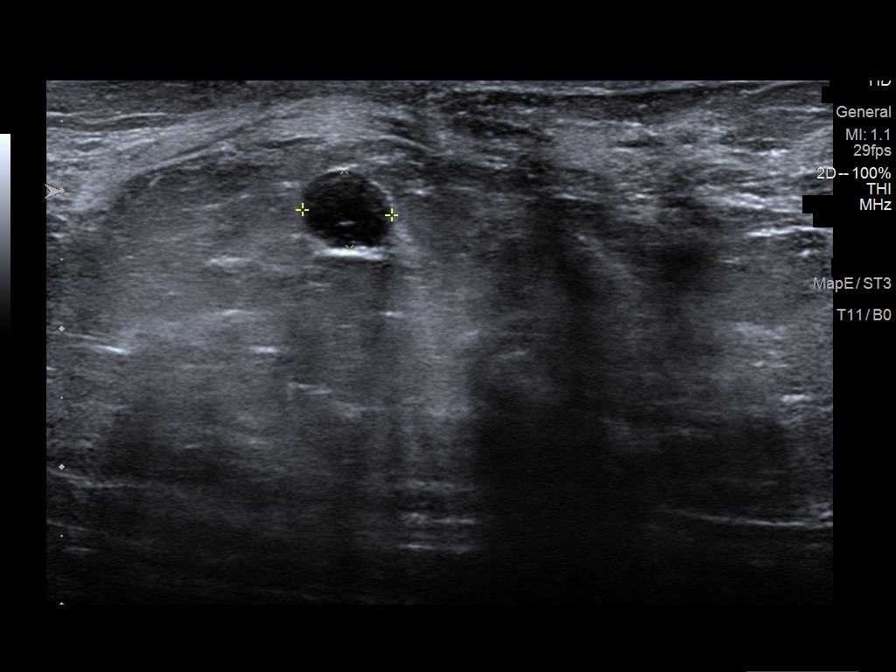
[im 7/9]
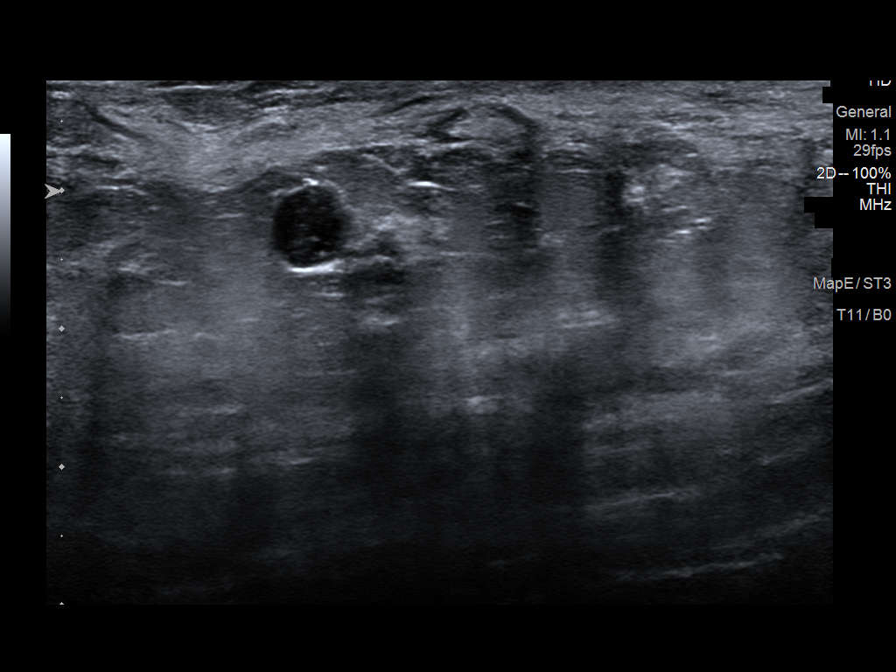
[im 8/9]
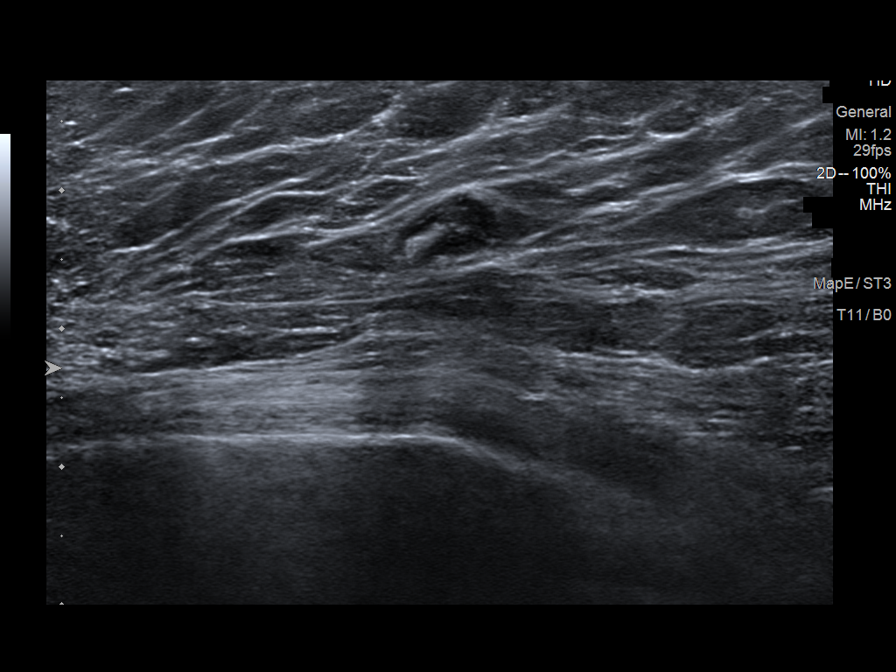
[im 9/9]
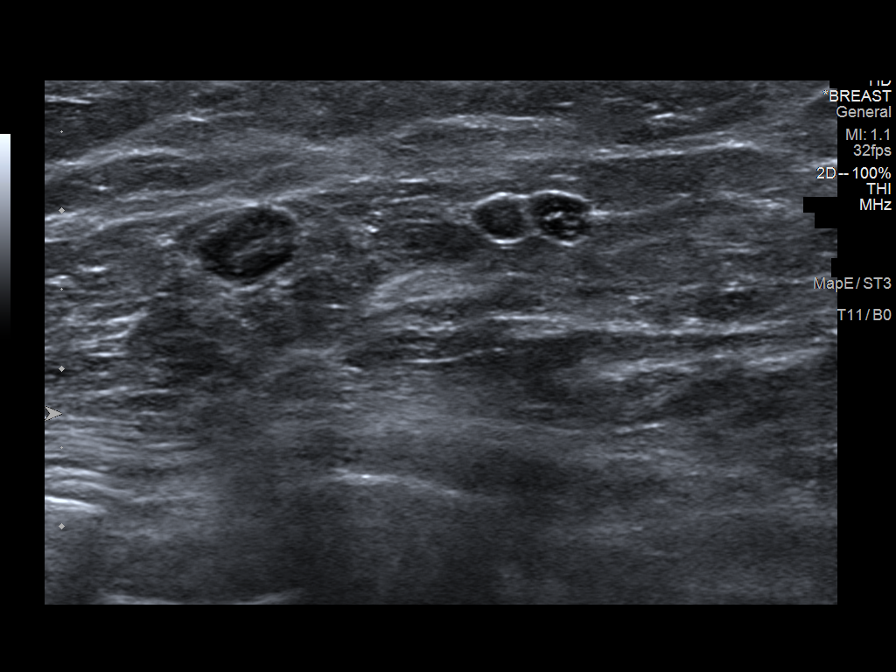

[9 of 9 positions shown; findings below may reference images not displayed]

ACR Breast Density Category b: There are scattered areas of
fibroglandular density.
FINDINGS: Possible mass noted in the anterior, upper, inner left breast,
persists on spot compression imaging. It is oval and mostly
circumscribed.

Mammographic images were processed with CAD.

Targeted ultrasound is performed, showing a round mass with internal
echoes and mostly circumscribed margins, measuring 6 mm in diameter.
There is no internal blood flow on color Doppler analysis. This mass
has a similar appearance to the mass biopsied in [DF].
IMPRESSION: 1. Probably benign mass left breast, most likely the same
fibrocystic pathology found on the left breast biopsy of the mass in
[DF]. Short-term follow-up recommended.

RECOMMENDATION:
Left breast ultrasound in 6 months.

I have discussed the findings and recommendations with the patient.
Results were also provided in writing at the conclusion of the
visit. If applicable, a reminder letter will be sent to the patient
regarding the next appointment.

BI-RADS CATEGORY  3: Probably benign.

## 2018-01-27 ENCOUNTER — Other Ambulatory Visit: Payer: Self-pay | Admitting: Obstetrics and Gynecology

## 2018-01-27 DIAGNOSIS — N63 Unspecified lump in unspecified breast: Secondary | ICD-10-CM

## 2018-02-01 DIAGNOSIS — R0982 Postnasal drip: Secondary | ICD-10-CM | POA: Diagnosis not present

## 2018-02-01 DIAGNOSIS — I10 Essential (primary) hypertension: Secondary | ICD-10-CM | POA: Diagnosis not present

## 2018-02-01 DIAGNOSIS — J309 Allergic rhinitis, unspecified: Secondary | ICD-10-CM | POA: Diagnosis not present

## 2018-02-01 DIAGNOSIS — R05 Cough: Secondary | ICD-10-CM | POA: Diagnosis not present

## 2018-03-04 DIAGNOSIS — I10 Essential (primary) hypertension: Secondary | ICD-10-CM | POA: Diagnosis not present

## 2018-03-04 DIAGNOSIS — Z6831 Body mass index (BMI) 31.0-31.9, adult: Secondary | ICD-10-CM | POA: Diagnosis not present

## 2018-03-04 DIAGNOSIS — R601 Generalized edema: Secondary | ICD-10-CM | POA: Diagnosis not present

## 2018-05-17 DIAGNOSIS — L255 Unspecified contact dermatitis due to plants, except food: Secondary | ICD-10-CM | POA: Diagnosis not present

## 2018-05-17 DIAGNOSIS — I10 Essential (primary) hypertension: Secondary | ICD-10-CM | POA: Diagnosis not present

## 2018-05-17 DIAGNOSIS — Z6829 Body mass index (BMI) 29.0-29.9, adult: Secondary | ICD-10-CM | POA: Diagnosis not present

## 2018-07-23 DIAGNOSIS — M7671 Peroneal tendinitis, right leg: Secondary | ICD-10-CM | POA: Diagnosis not present

## 2018-07-23 DIAGNOSIS — N3001 Acute cystitis with hematuria: Secondary | ICD-10-CM | POA: Diagnosis not present

## 2018-07-23 DIAGNOSIS — Z6828 Body mass index (BMI) 28.0-28.9, adult: Secondary | ICD-10-CM | POA: Diagnosis not present

## 2018-07-27 DIAGNOSIS — Z85820 Personal history of malignant melanoma of skin: Secondary | ICD-10-CM | POA: Diagnosis not present

## 2018-07-27 DIAGNOSIS — D225 Melanocytic nevi of trunk: Secondary | ICD-10-CM | POA: Diagnosis not present

## 2018-07-27 DIAGNOSIS — L821 Other seborrheic keratosis: Secondary | ICD-10-CM | POA: Diagnosis not present

## 2018-07-27 DIAGNOSIS — D485 Neoplasm of uncertain behavior of skin: Secondary | ICD-10-CM | POA: Diagnosis not present

## 2018-08-02 ENCOUNTER — Other Ambulatory Visit: Payer: Self-pay | Admitting: Obstetrics and Gynecology

## 2018-08-02 ENCOUNTER — Ambulatory Visit
Admission: RE | Admit: 2018-08-02 | Discharge: 2018-08-02 | Disposition: A | Discharge status: Home / Self Care | Payer: Medicare Other | Source: Ambulatory Visit | Attending: Obstetrics and Gynecology | Admitting: Obstetrics and Gynecology

## 2018-08-02 ENCOUNTER — Other Ambulatory Visit: Payer: Self-pay

## 2018-08-02 DIAGNOSIS — N6452 Nipple discharge: Secondary | ICD-10-CM

## 2018-08-02 DIAGNOSIS — N6002 Solitary cyst of left breast: Secondary | ICD-10-CM | POA: Diagnosis not present

## 2018-08-02 DIAGNOSIS — N63 Unspecified lump in unspecified breast: Secondary | ICD-10-CM

## 2018-08-02 DIAGNOSIS — R928 Other abnormal and inconclusive findings on diagnostic imaging of breast: Secondary | ICD-10-CM | POA: Diagnosis not present

## 2018-08-02 DIAGNOSIS — N6323 Unspecified lump in the left breast, lower outer quadrant: Secondary | ICD-10-CM | POA: Diagnosis not present

## 2018-08-02 IMAGING — US ULTRASOUND LEFT BREAST LIMITED
1 series · 13 of 16 positions shown · non-contrast
Comparison: Previous exam(s).

CLINICAL DATA: Follow-up probably benign mass in the 11 o'clock
position of the left breast. The patient reports intermittent
spontaneous clear nipple discharge on the left for approximately 2
years.

EXAM:
DIGITAL DIAGNOSTIC LEFT MAMMOGRAM WITH CAD AND TOMO
ULTRASOUND LEFT BREAST

[Series 1: ultrasound left breast limited · 0.06mm/px · 13 of 16 slices shown]
[im 1/16]
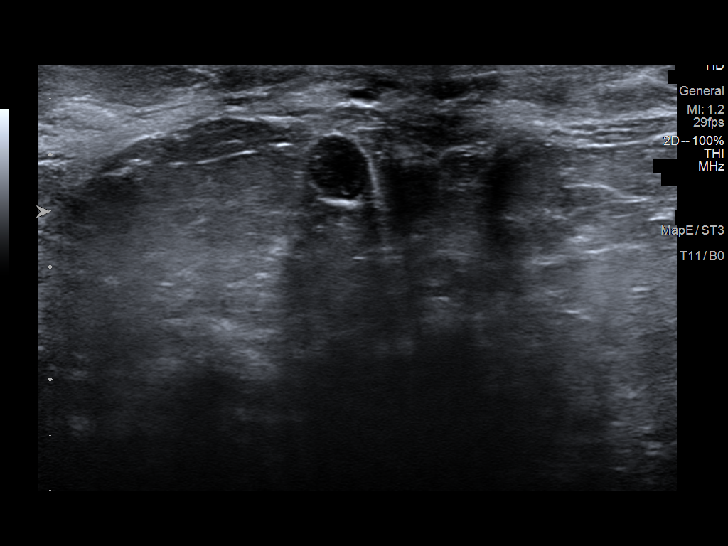
[im 2/16]
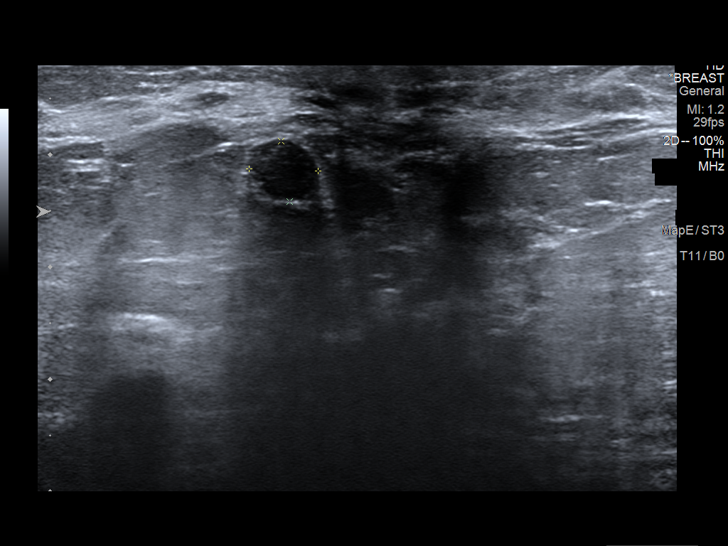
[im 4/16]
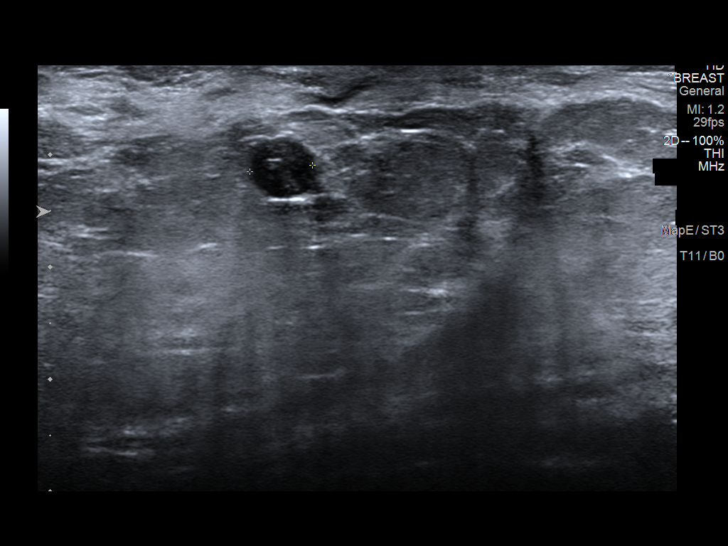
[im 5/16]
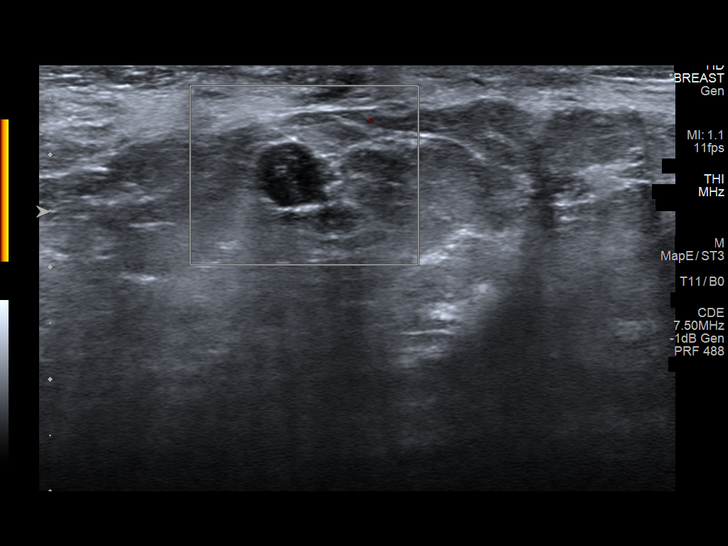
[im 6/16]
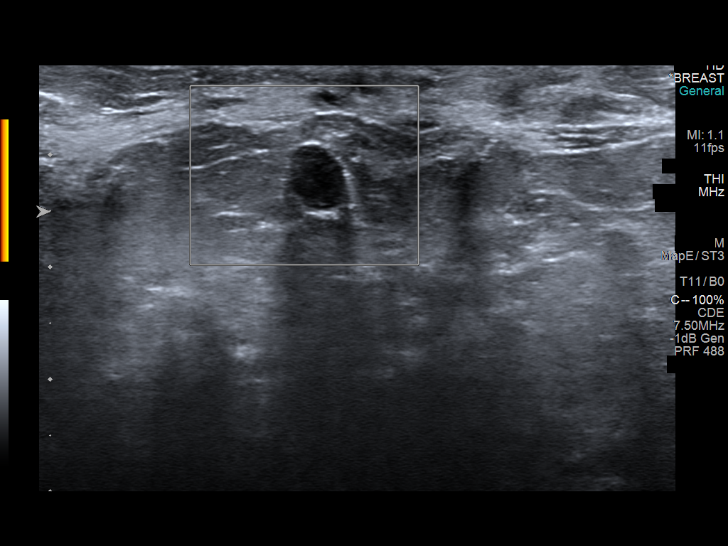
[im 7/16]
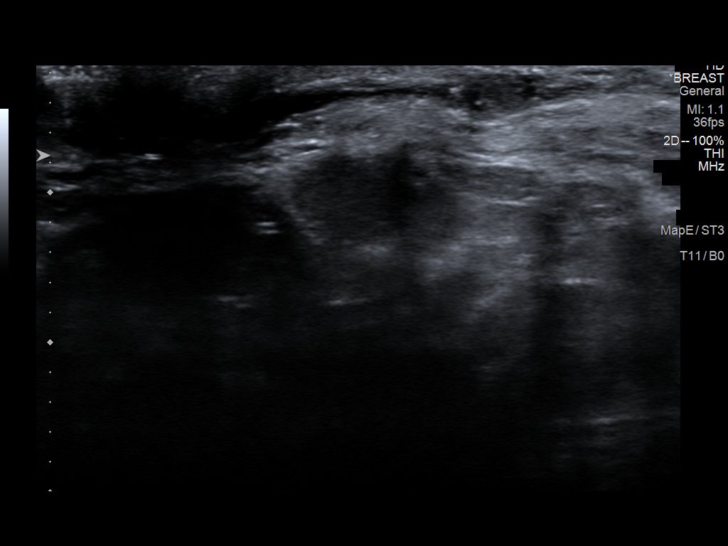
[im 9/16]
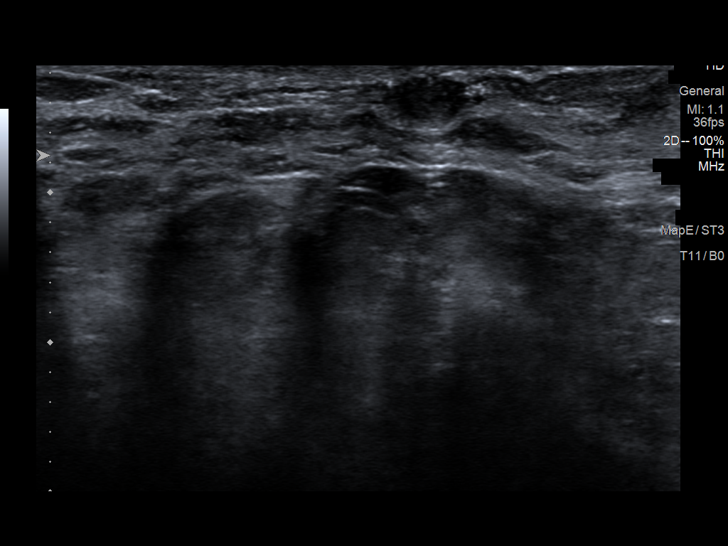
[im 10/16]
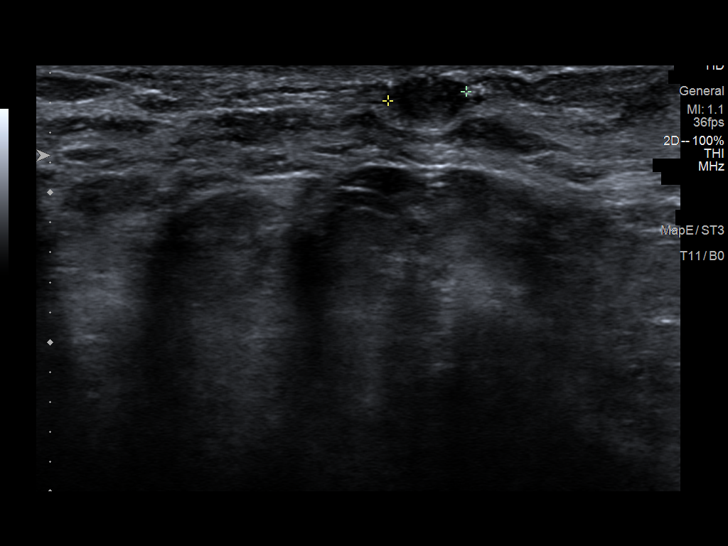
[im 11/16]
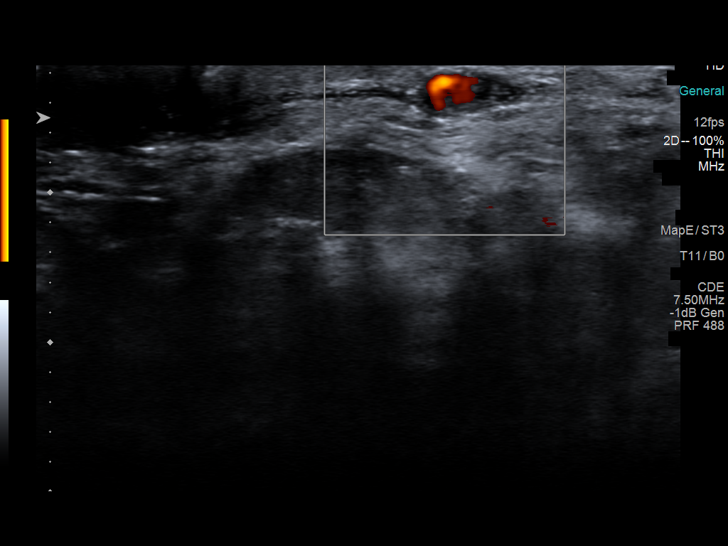
[im 12/16]
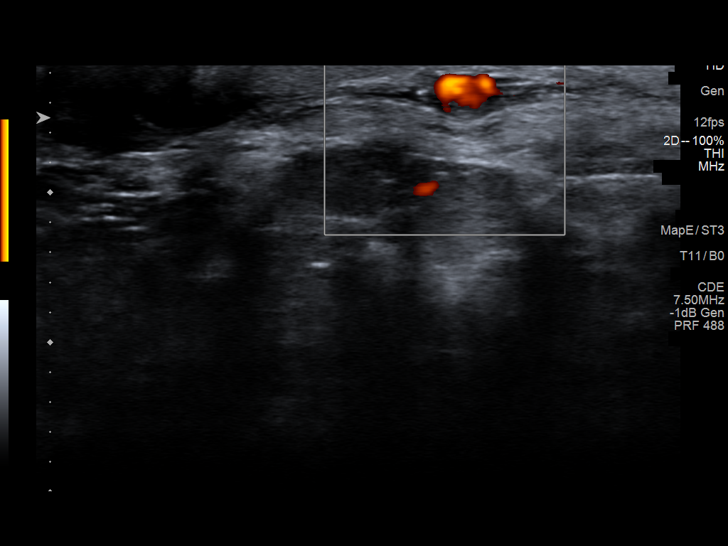
[im 13/16]
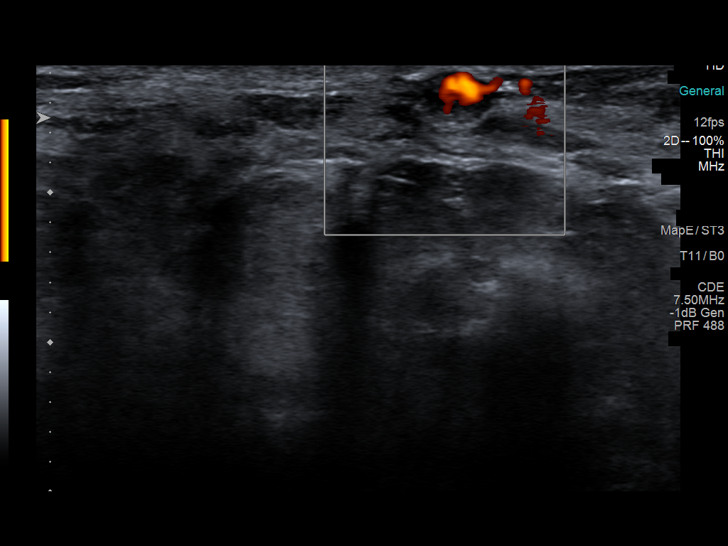
[im 15/16]
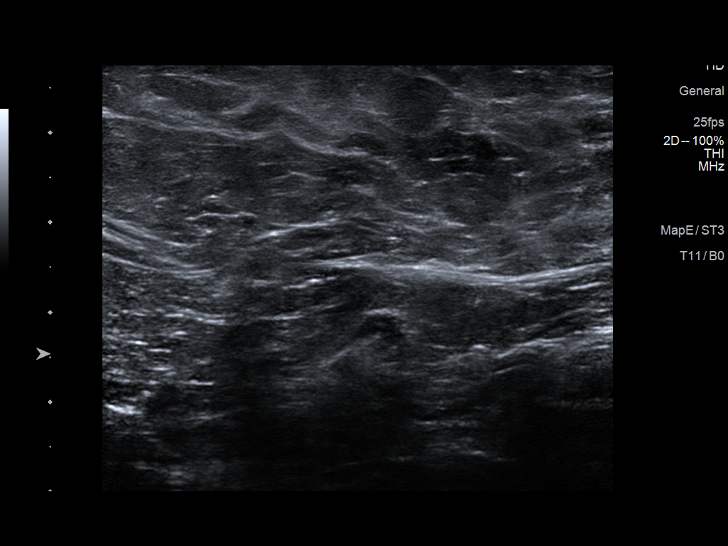
[im 16/16]
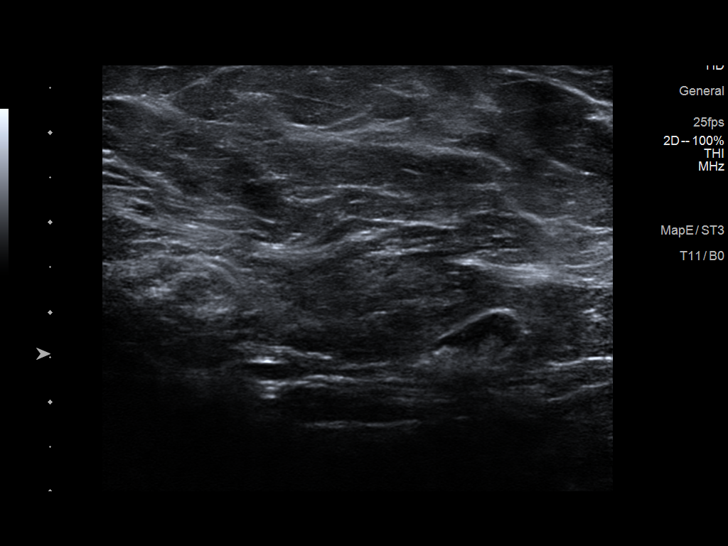

[13 of 16 positions shown; findings below may reference images not displayed]

ACR Breast Density Category b: There are scattered areas of
fibroglandular density.
FINDINGS: Stable mammographic appearance of the left breast, including the
small, oval, circumscribed mass in the upper inner retroareolar
region. No interval findings suspicious for malignancy.

On physical exam, a small amount of clear nipple discharge was
elicited from the inferior aspect of the left nipple. There were no
palpable masses in the left breast or left axilla.

Targeted ultrasound is performed, showing a 6 x 6 x 5 mm rounded,
circumscribed, hypoechoic mass in the 11 o'clock retroareolar left
breast. No internal blood flow was seen power Doppler. At real time,
with harmonics, this had features of a cyst with a small amount of
dependent calcification. This measured 7 x 6 x 6 mm on [DATE].

In the 5 o'clock retroareolar left breast, just beneath the skin, a
small, oval, solid intraductal mass is demonstrated. This measures 6
x 5 x 3 mm and has prominent internal blood flow with power Doppler.

Ultrasound of the left axilla demonstrated normal appearing left
axillary lymph nodes.

Mammographic images were processed with CAD.
IMPRESSION: 1. 6 mm 5 o'clock retroareolar solid intraductal mass just beneath
the skin. This most likely represents an intraductal papilloma.
However, malignancy cannot be excluded.
2. Interval slight decrease in size of a small, benign, complicated
cyst in the 11 o'clock retroareolar left breast. This does not
require further follow-up.

RECOMMENDATION:
Ultrasound-guided core needle biopsy of the 6 mm mass in the 5
o'clock retroareolar left breast. This has been discussed with the
patient and scheduled at [DATE] a.m. on [DATE].

I have discussed the findings and recommendations with the patient.
Results were also provided in writing at the conclusion of the
visit. If applicable, a reminder letter will be sent to the patient
regarding the next appointment.

BI-RADS CATEGORY  4: Suspicious.

## 2018-08-02 IMAGING — MG DIGITAL DIAGNOSTIC UNILATERAL LEFT MAMMOGRAM WITH TOMO AND CAD
6 series · 6 of 18 positions shown · non-contrast
Comparison: Previous exam(s).

CLINICAL DATA: Follow-up probably benign mass in the 11 o'clock
position of the left breast. The patient reports intermittent
spontaneous clear nipple discharge on the left for approximately 2
years.

EXAM:
DIGITAL DIAGNOSTIC LEFT MAMMOGRAM WITH CAD AND TOMO
ULTRASOUND LEFT BREAST

[L MLO synth-2D (1 of 2)]
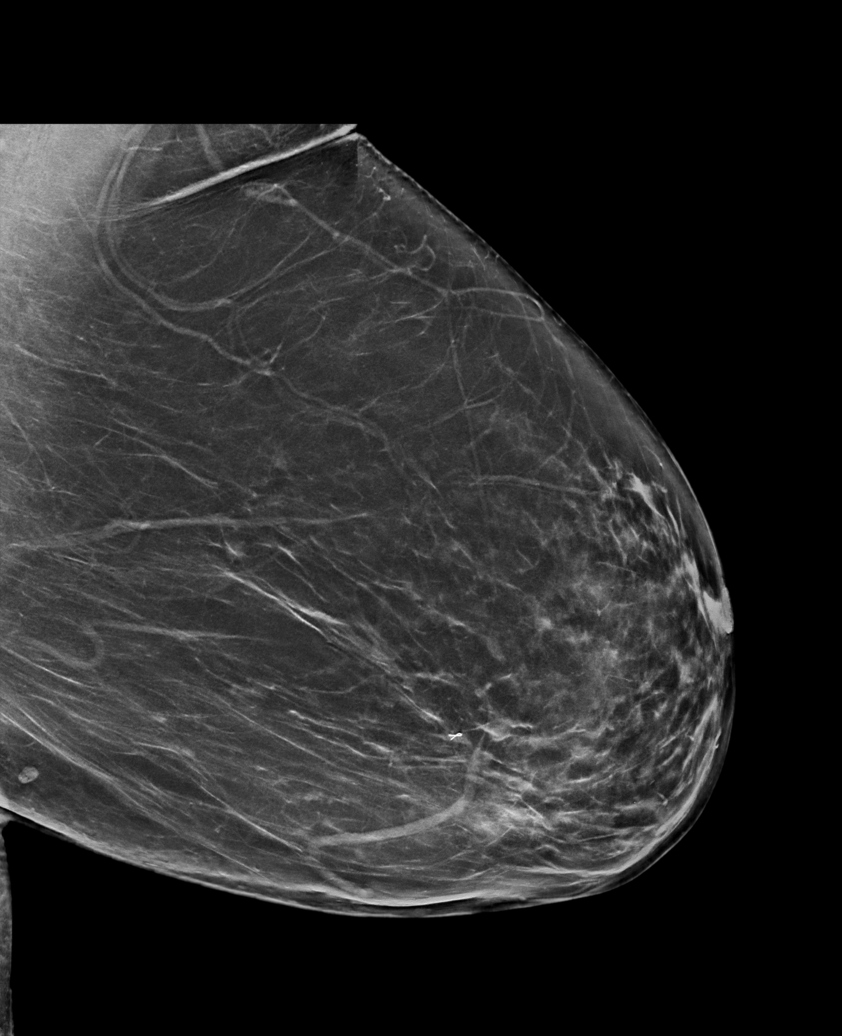

[L CC synth-2D]
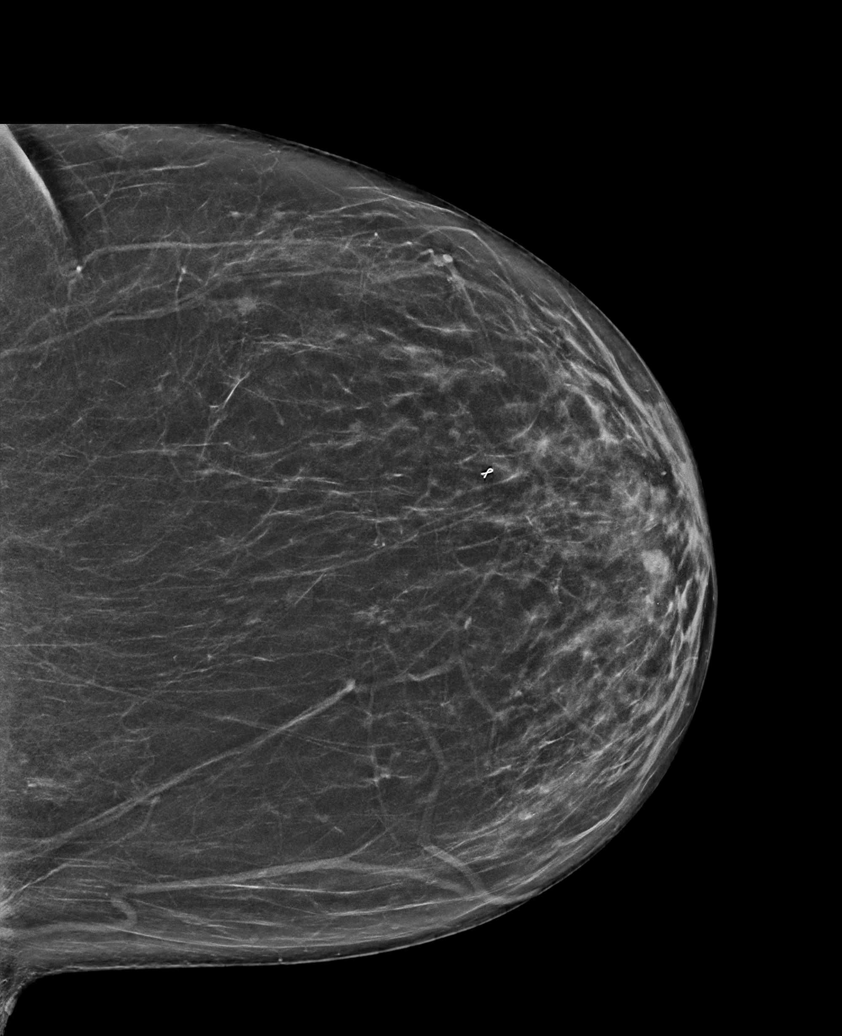

[L MLO synth-2D (2 of 2)]
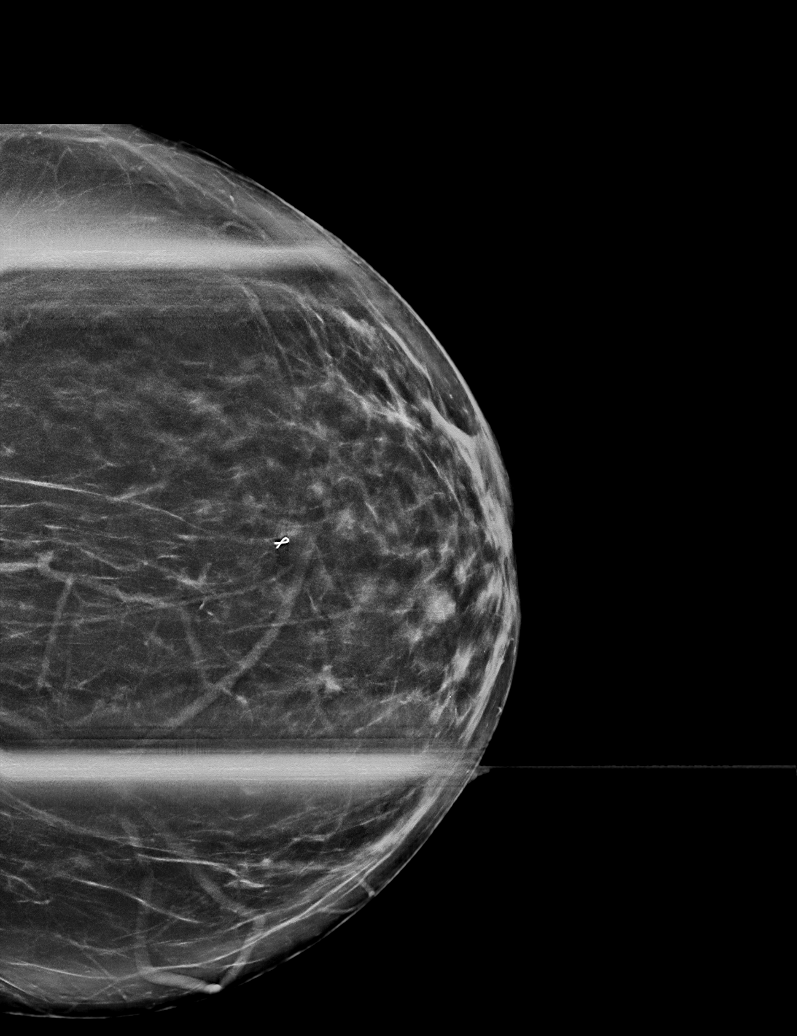

[L CC tomo · tomo slice 37/72.0]
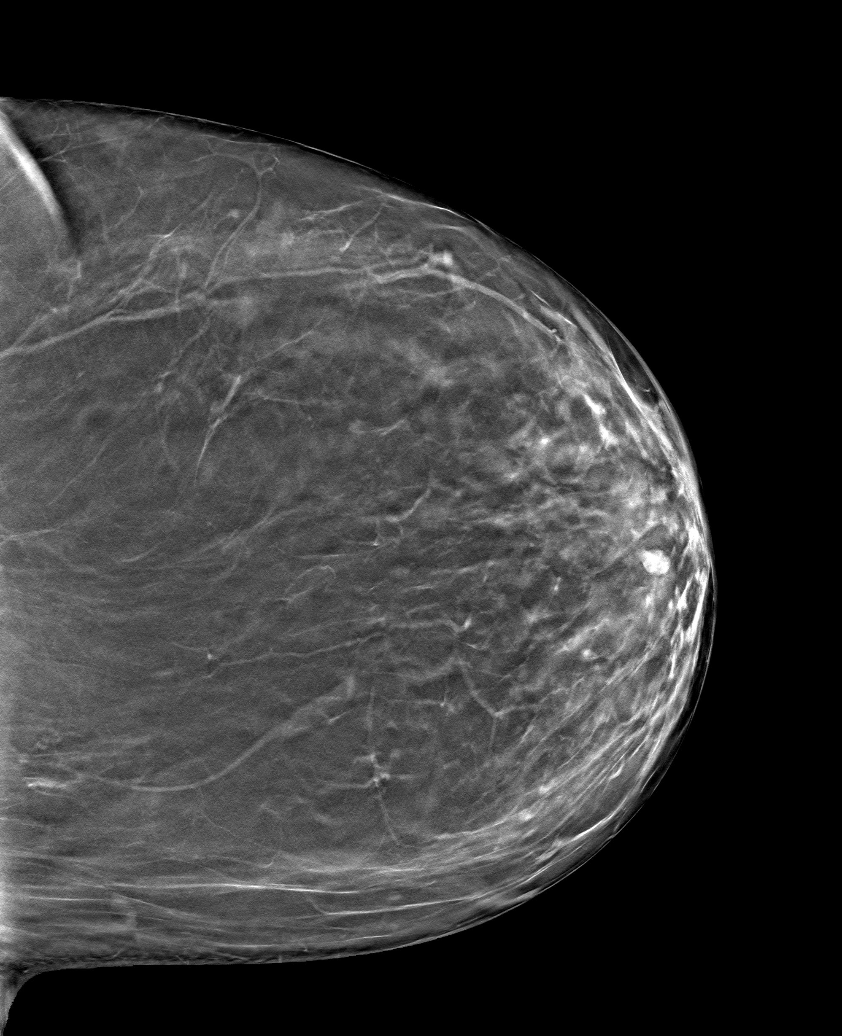

[L MLO tomo (1 of 2) · tomo slice 42/83.0]
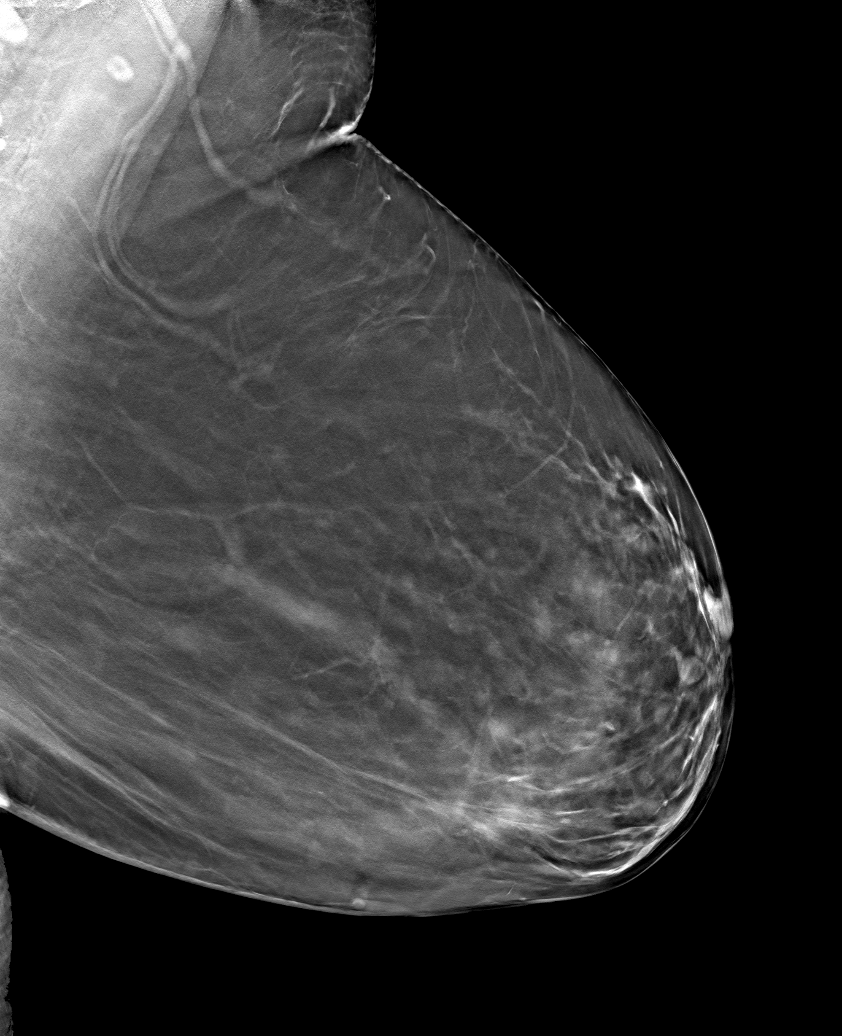

[L MLO tomo (2 of 2) · tomo slice 41/81.0]
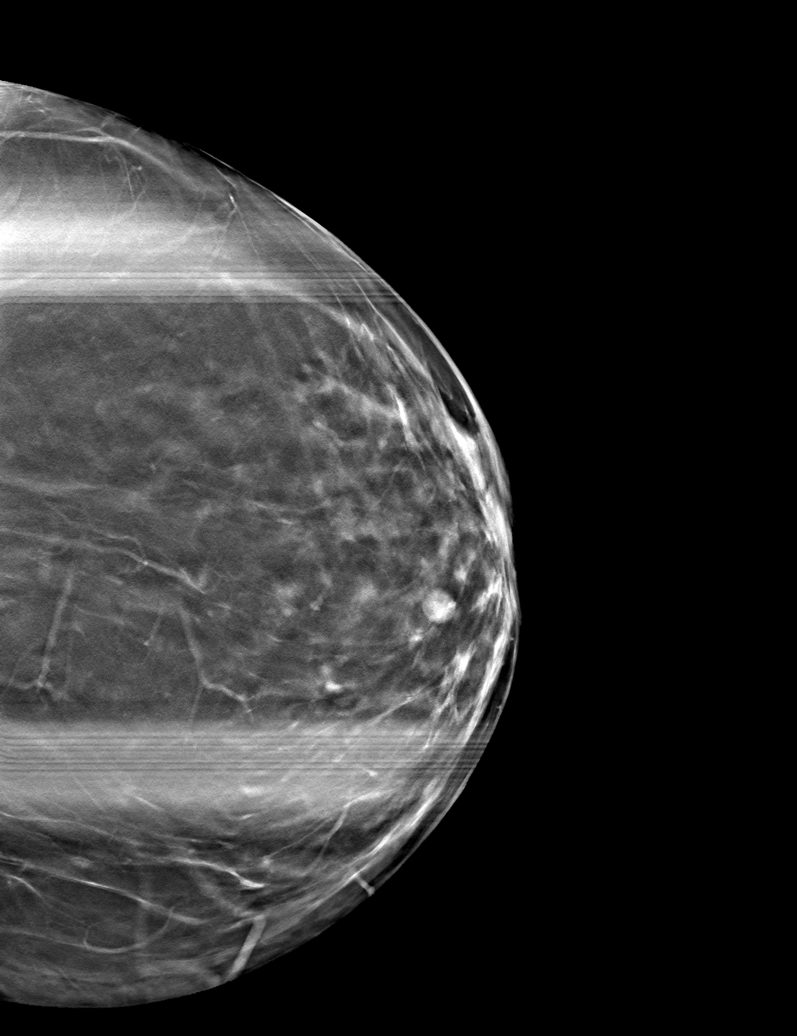

[6 of 18 positions shown; findings below may reference images not displayed]

ACR Breast Density Category b: There are scattered areas of
fibroglandular density.
FINDINGS: Stable mammographic appearance of the left breast, including the
small, oval, circumscribed mass in the upper inner retroareolar
region. No interval findings suspicious for malignancy.

On physical exam, a small amount of clear nipple discharge was
elicited from the inferior aspect of the left nipple. There were no
palpable masses in the left breast or left axilla.

Targeted ultrasound is performed, showing a 6 x 6 x 5 mm rounded,
circumscribed, hypoechoic mass in the 11 o'clock retroareolar left
breast. No internal blood flow was seen power Doppler. At real time,
with harmonics, this had features of a cyst with a small amount of
dependent calcification. This measured 7 x 6 x 6 mm on [DATE].

In the 5 o'clock retroareolar left breast, just beneath the skin, a
small, oval, solid intraductal mass is demonstrated. This measures 6
x 5 x 3 mm and has prominent internal blood flow with power Doppler.

Ultrasound of the left axilla demonstrated normal appearing left
axillary lymph nodes.

Mammographic images were processed with CAD.
IMPRESSION: 1. 6 mm 5 o'clock retroareolar solid intraductal mass just beneath
the skin. This most likely represents an intraductal papilloma.
However, malignancy cannot be excluded.
2. Interval slight decrease in size of a small, benign, complicated
cyst in the 11 o'clock retroareolar left breast. This does not
require further follow-up.

RECOMMENDATION:
Ultrasound-guided core needle biopsy of the 6 mm mass in the 5
o'clock retroareolar left breast. This has been discussed with the
patient and scheduled at [DATE] a.m. on [DATE].

I have discussed the findings and recommendations with the patient.
Results were also provided in writing at the conclusion of the
visit. If applicable, a reminder letter will be sent to the patient
regarding the next appointment.

BI-RADS CATEGORY  4: Suspicious.

## 2018-08-04 ENCOUNTER — Ambulatory Visit
Admission: RE | Admit: 2018-08-04 | Discharge: 2018-08-04 | Disposition: A | Discharge status: Home / Self Care | Payer: Medicare Other | Source: Ambulatory Visit | Attending: Obstetrics and Gynecology | Admitting: Obstetrics and Gynecology

## 2018-08-04 ENCOUNTER — Other Ambulatory Visit: Payer: Self-pay

## 2018-08-04 DIAGNOSIS — N63 Unspecified lump in unspecified breast: Secondary | ICD-10-CM

## 2018-08-04 DIAGNOSIS — N6042 Mammary duct ectasia of left breast: Secondary | ICD-10-CM | POA: Diagnosis not present

## 2018-08-04 DIAGNOSIS — N6342 Unspecified lump in left breast, subareolar: Secondary | ICD-10-CM | POA: Diagnosis not present

## 2018-08-04 IMAGING — MG MM CLIP PLACEMENT
4 series · 4 of 12 positions shown · non-contrast
Comparison: Previous exam(s).

CLINICAL DATA: Ultrasound-guided core needle biopsy was performed
of a superficial retroareolar left breast mass.

EXAM:
DIAGNOSTIC LEFT MAMMOGRAM POST ULTRASOUND BIOPSY

[L CC synth-2D]
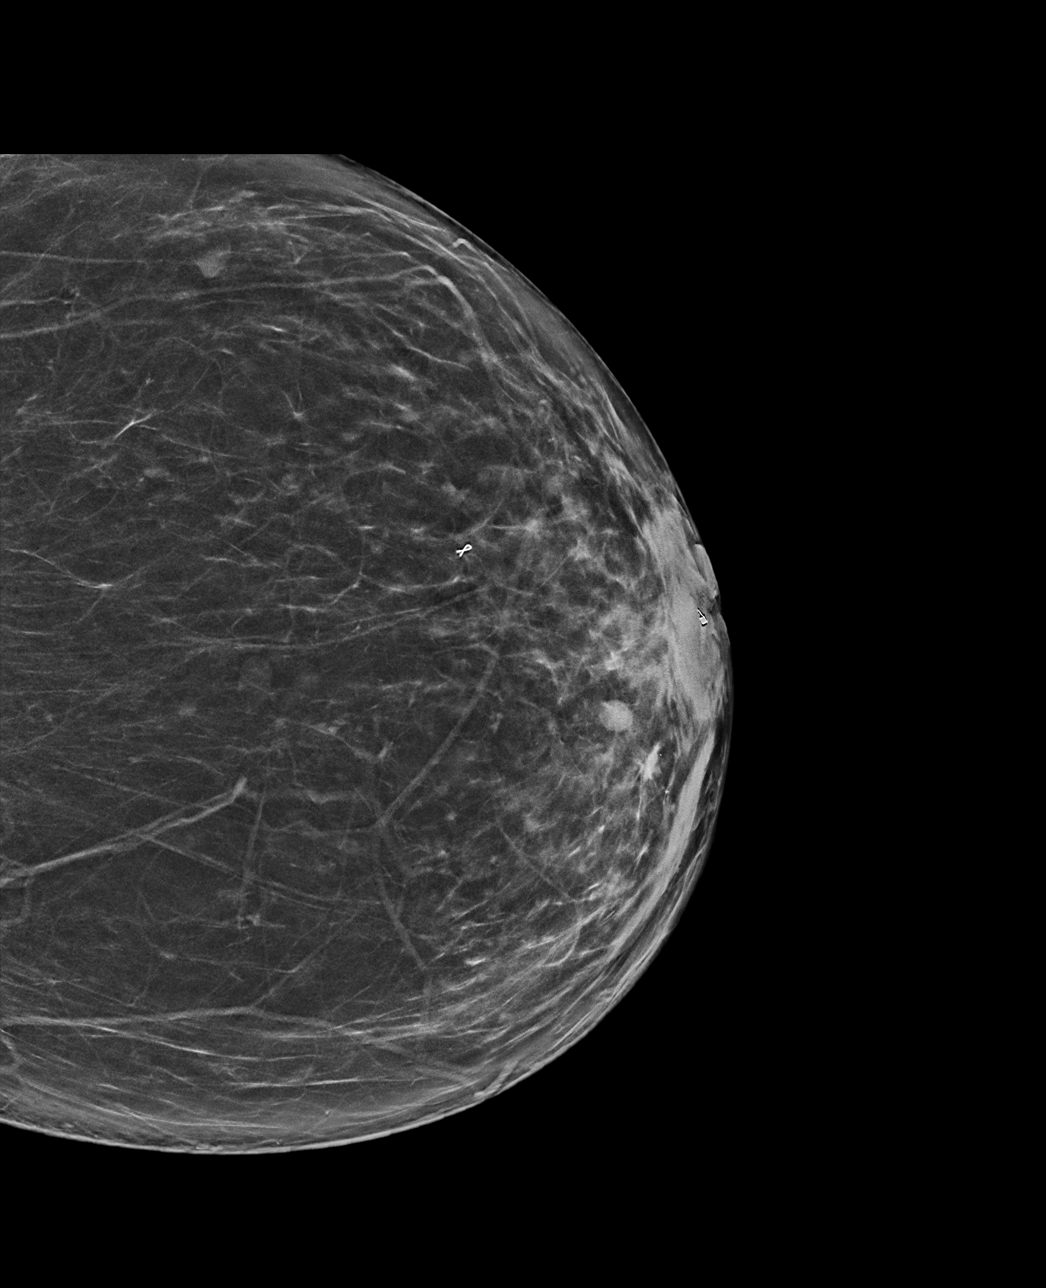

[L ML synth-2D]
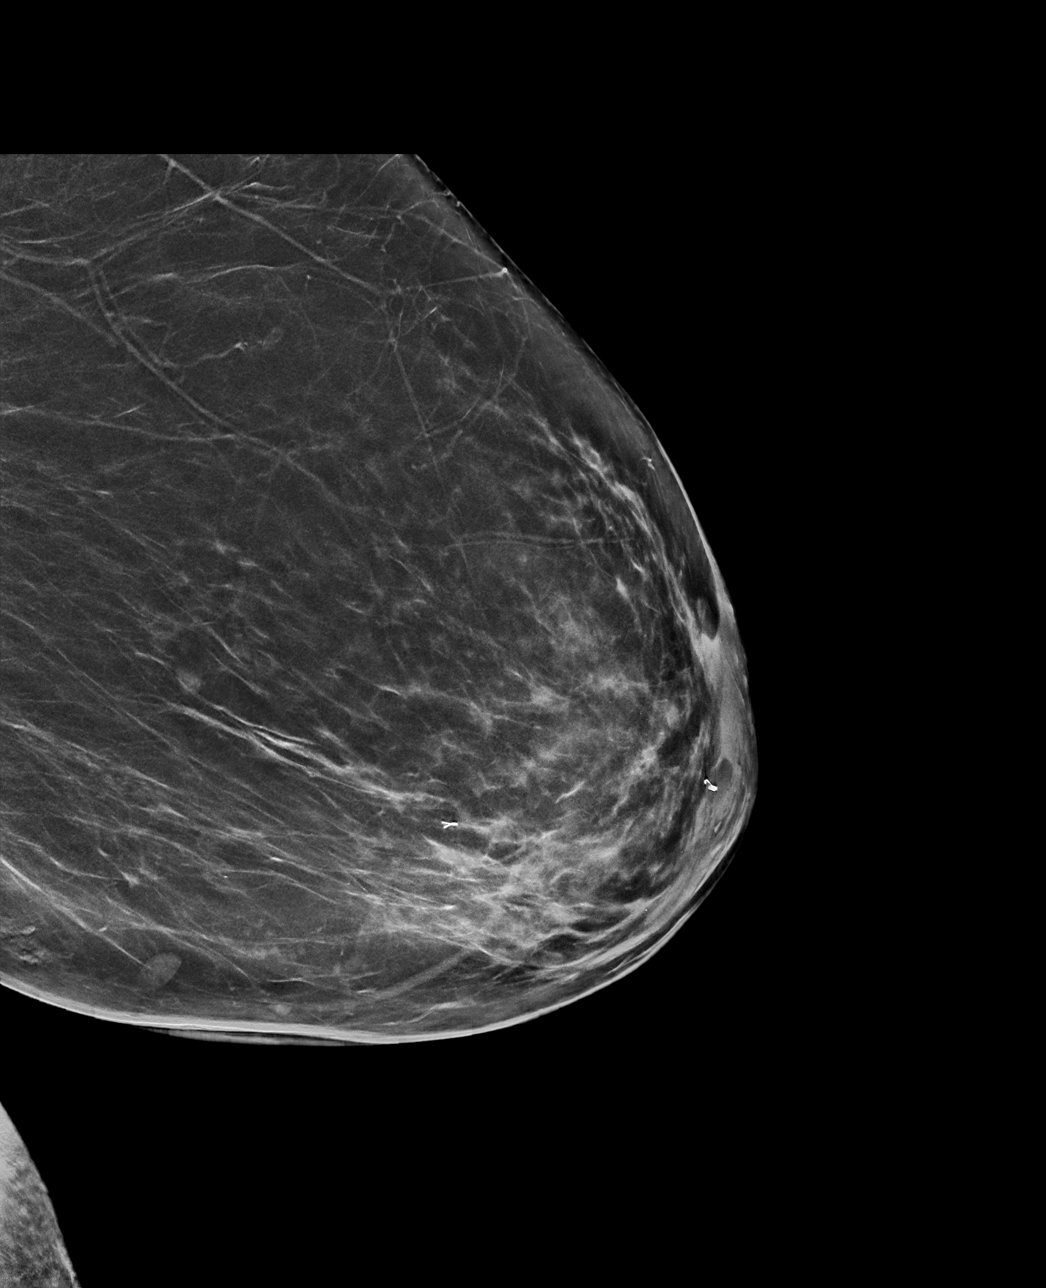

[L CC tomo · tomo slice 36/71.0]
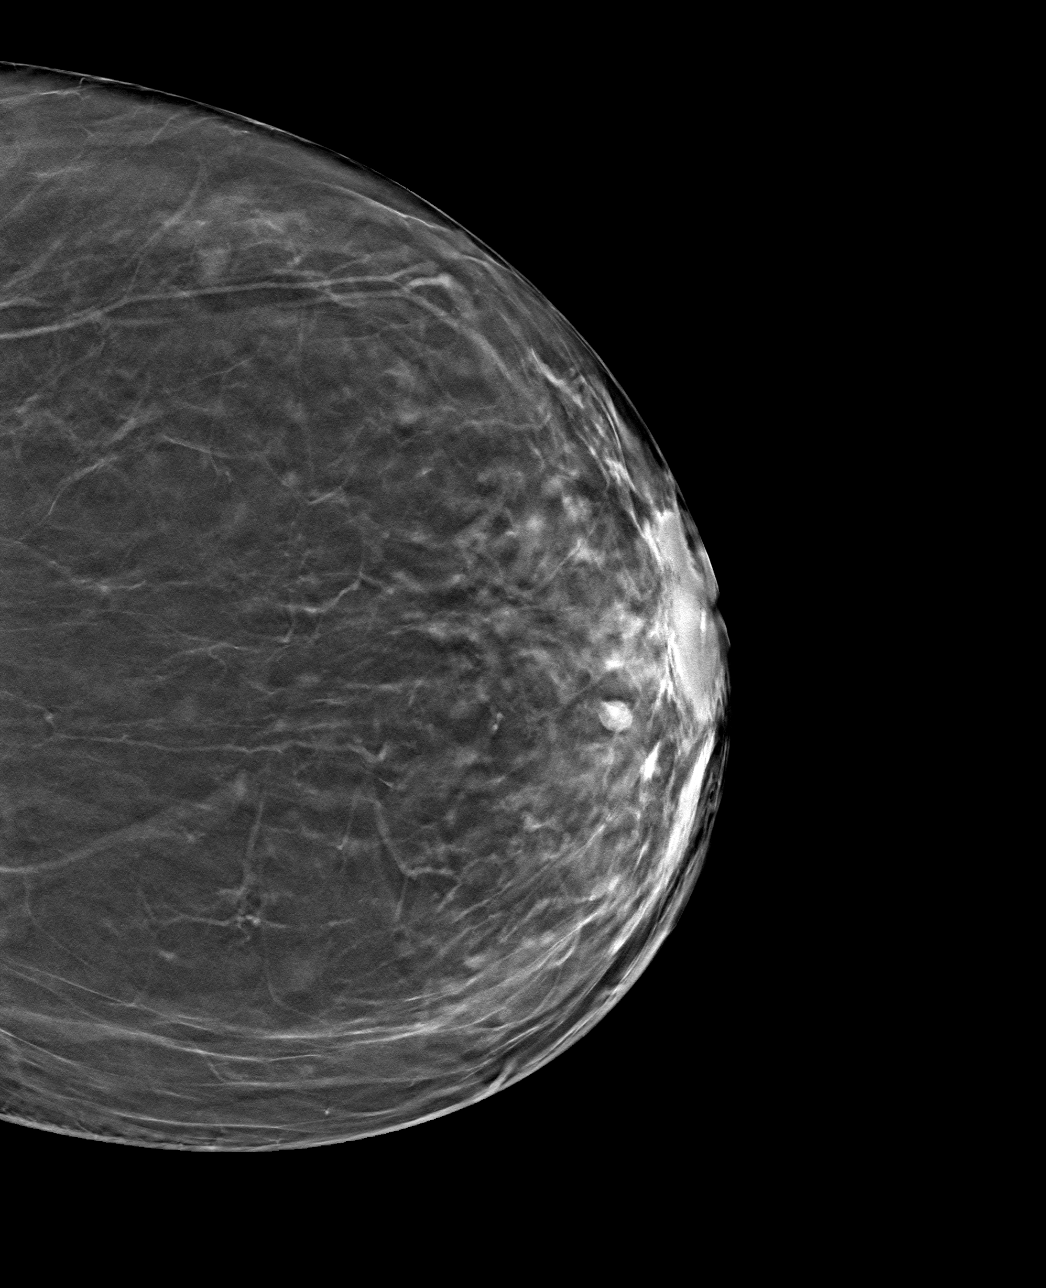

[L ML tomo · tomo slice 40/79.0]
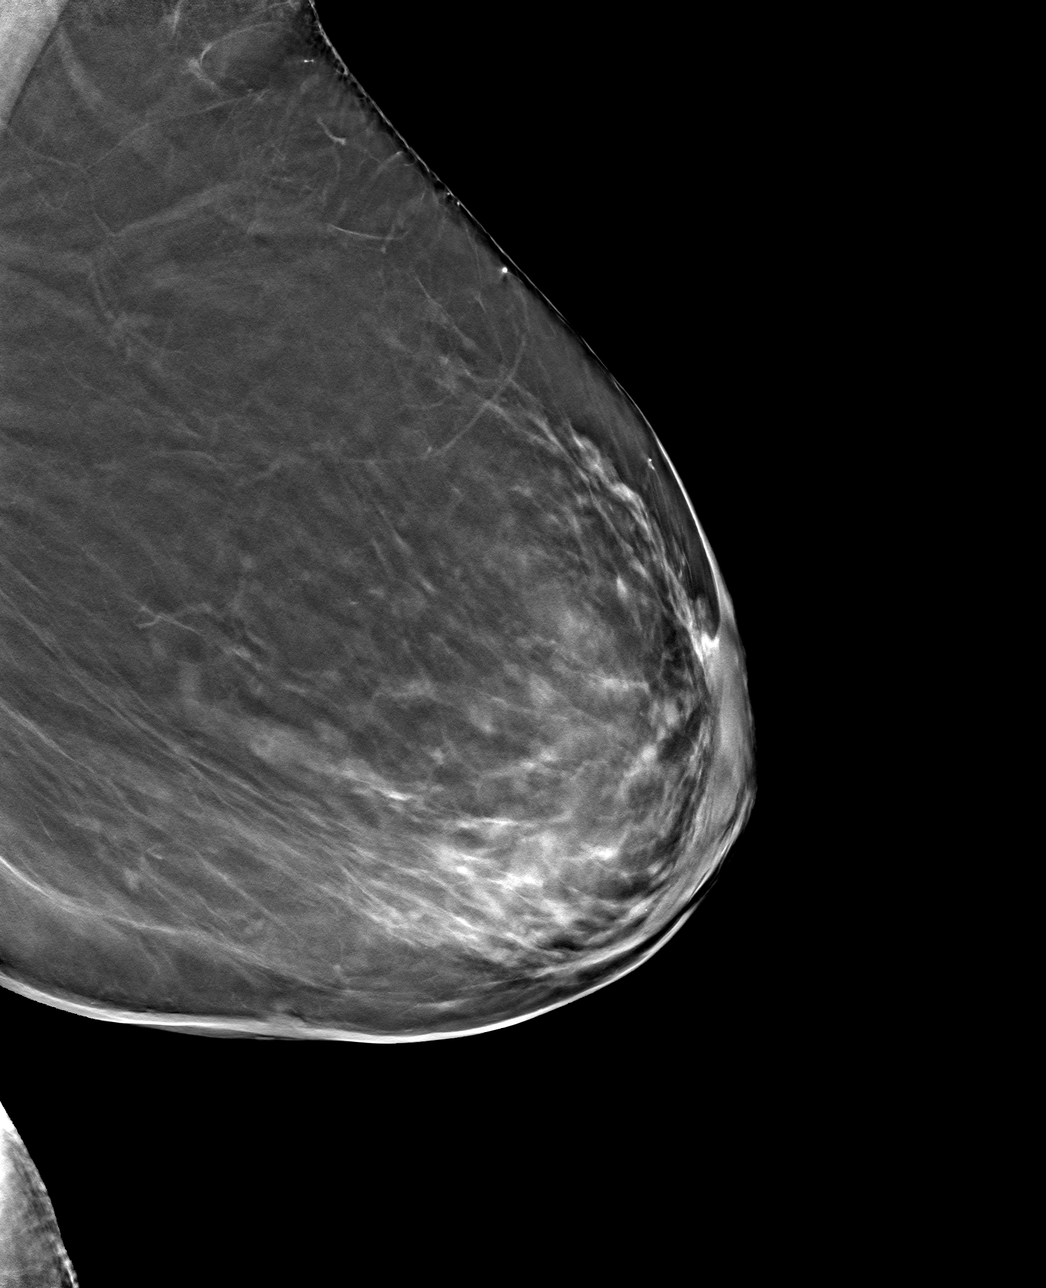

[4 of 12 positions shown; findings below may reference images not displayed]

FINDINGS: Mammographic images were obtained following ultrasound guided biopsy
of intraductal retroareolar left breast mass 5 o'clock position. The
coil shaped biopsy clip is positioned along the medial aspect of the
expected location of the mass, on ultrasound and mammogram images.
IMPRESSION: Biopsy clip positioned along the medial aspect of the mass.

Final Assessment: Post Procedure Mammograms for Marker Placement

## 2018-08-04 IMAGING — US US BREAST BX W LOC DEV 1ST LESION IMG BX SPEC US GUIDE*L*
1 series · 11 of 11 positions shown · non-contrast
Comparison: Previous exam(s).
COMPARISON: Previous exam(s).

Addendum:
CLINICAL DATA: Ultrasound-guided core needle biopsy was recommended
of a superficial retroareolar intraductal mass 5 o'clock position
left breast. Patient with history of clear left nipple discharge.

EXAM:
ULTRASOUND GUIDED LEFT BREAST CORE NEEDLE BIOPSY

[Series 1: us breast bx w loc dev 1st lesion img bx spec us g · 0.04mm/px · 11 of 11 slices shown]
[im 1/11]
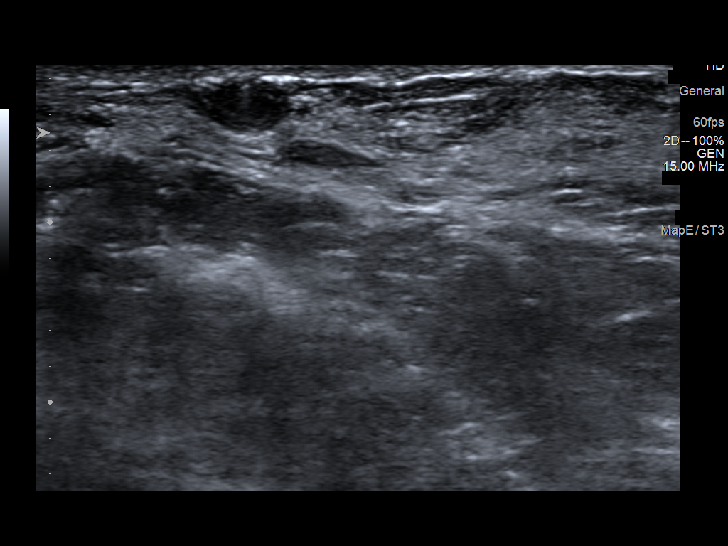
[im 2/11]
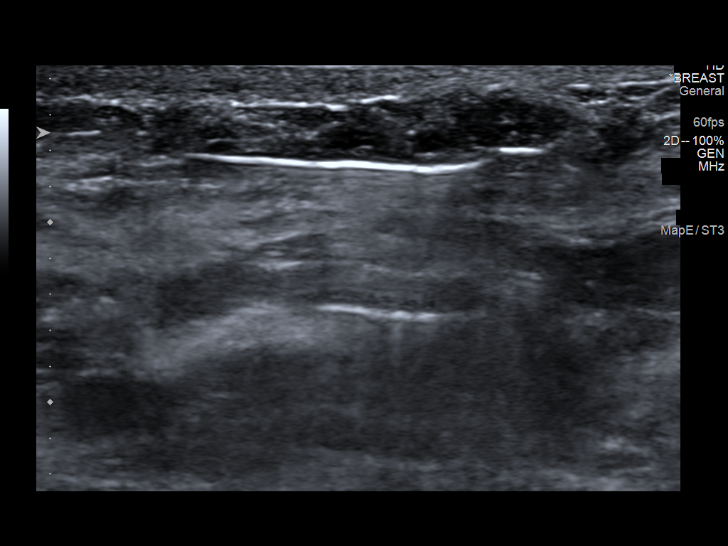
[im 3/11]
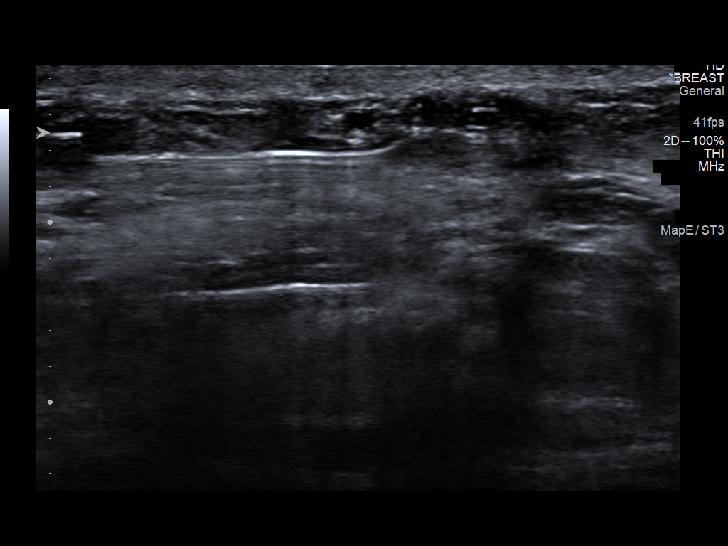
[im 4/11]
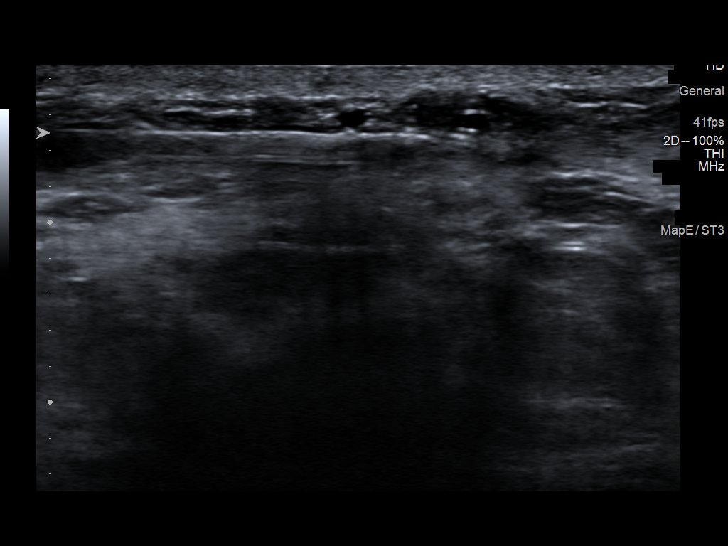
[im 5/11]
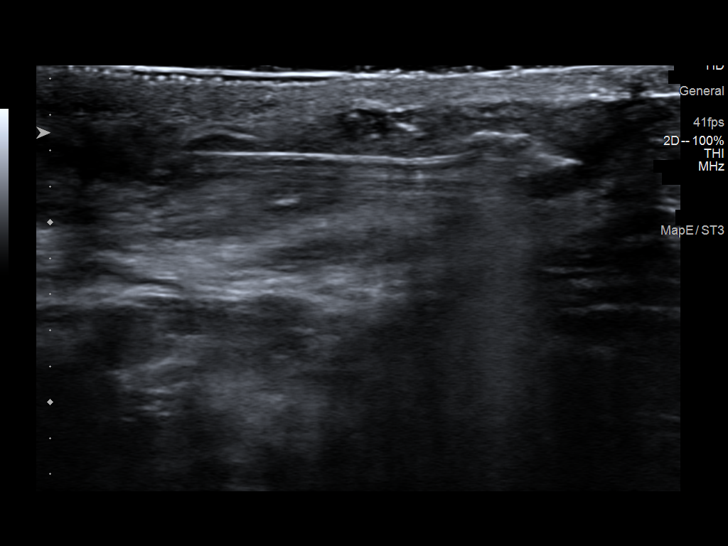
[im 6/11]
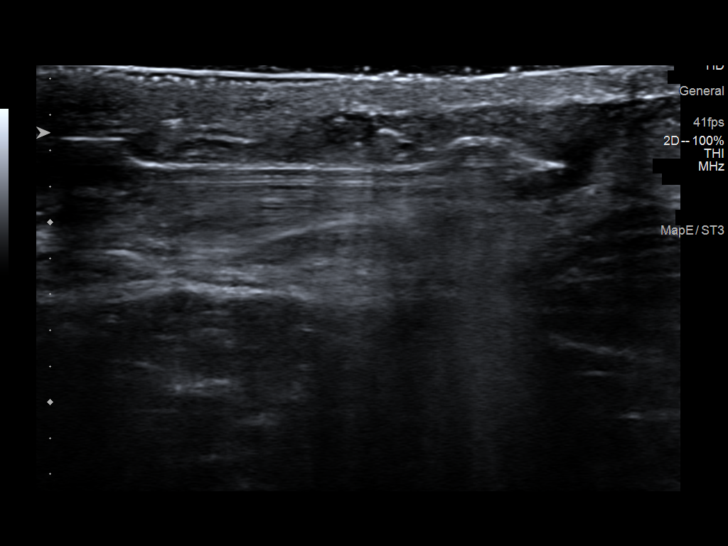
[im 7/11]
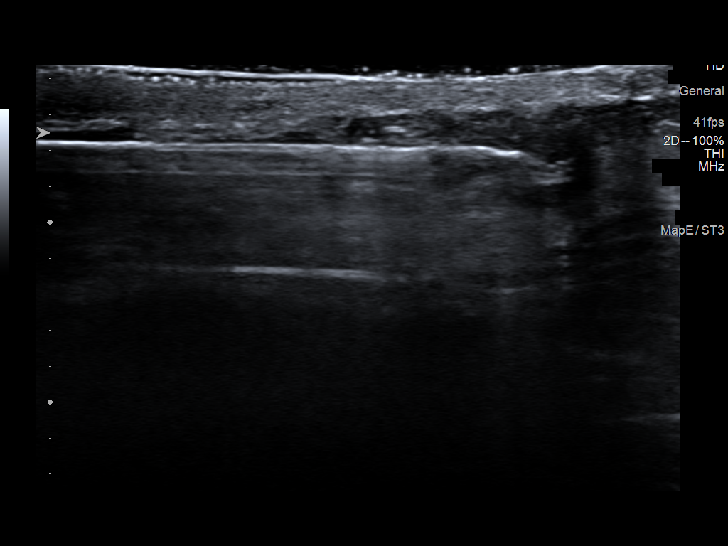
[im 8/11]
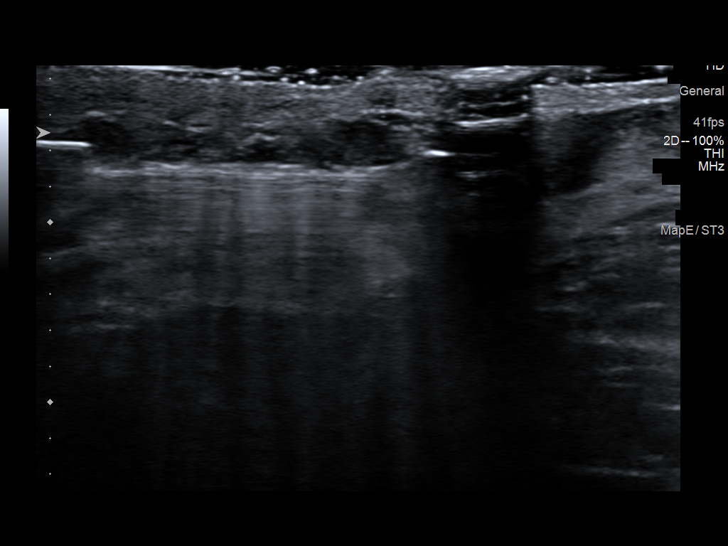
[im 9/11]
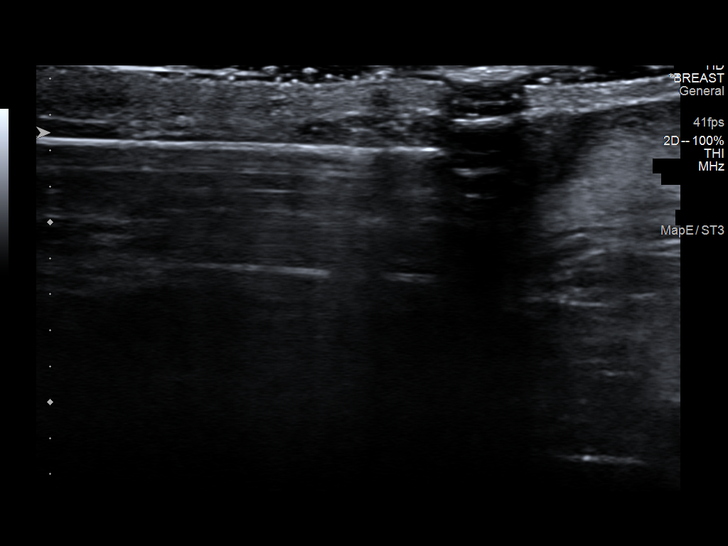
[im 10/11]
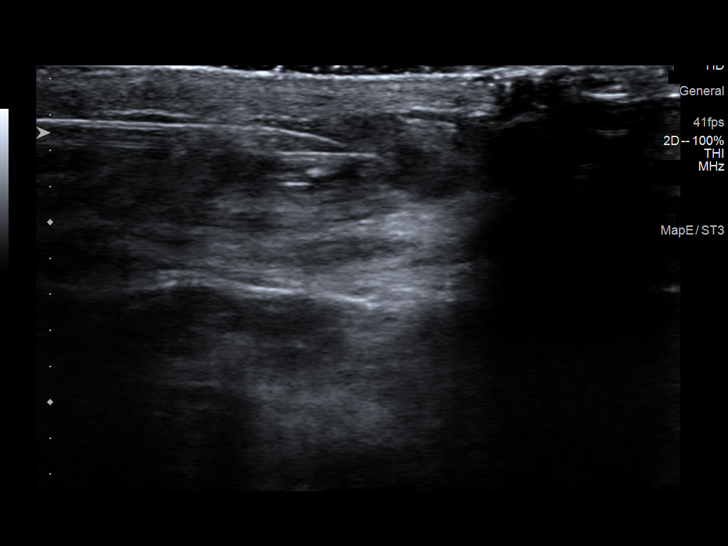
[im 11/11]
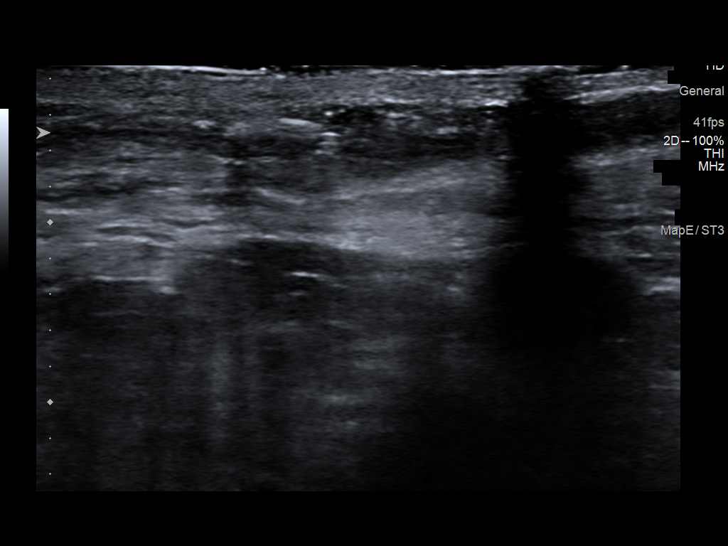

[11 of 11 positions shown; findings below may reference images not displayed]



Lesion quadrant: Lower outer quadrant

Using sterile technique and 1% Lidocaine as local anesthetic, under
direct ultrasound visualization, a 14 gauge JUMPER device was
used to perform biopsy of an intraductal mass 5 o'clock retroareolar
left breast using a medial approach. At the conclusion of the
procedure a coil tissue marker clip was deployed into the biopsy
cavity. Follow up 2 view mammogram was performed and dictated
separately.
IMPRESSION: Ultrasound guided biopsy of the left breast. No apparent
complications.

ADDENDUM:
Pathology revealed DILATED DUCTS WITH HEMORRHAGE of the LEFT breast,
5 o'clock, retroareolar. SEE COMMENT. This was found to be
discordant with imaging by Dr. JUMPER, with surgical
consultation for excision.

Microscopic Comment: Papillomatous fragments are not seen.

Pathology results were discussed with the patient by telephone. The
patient reported doing well after the biopsy with tenderness at the
site. Post biopsy instructions and care were reviewed and questions
were answered. The patient was encouraged to call The [REDACTED]

Surgical consultation has been arranged with Dr. JUMPER at
[REDACTED] on [DATE].

Pathology results reported by JUMPER, RN on [DATE].



Lesion quadrant: Lower outer quadrant

Using sterile technique and 1% Lidocaine as local anesthetic, under
direct ultrasound visualization, a 14 gauge JUMPER device was
used to perform biopsy of an intraductal mass 5 o'clock retroareolar
left breast using a medial approach. At the conclusion of the
procedure a coil tissue marker clip was deployed into the biopsy
cavity. Follow up 2 view mammogram was performed and dictated
separately.
IMPRESSION: Ultrasound guided biopsy of the left breast. No apparent
complications.

## 2018-08-16 ENCOUNTER — Ambulatory Visit: Payer: Self-pay | Admitting: Surgery

## 2018-08-16 DIAGNOSIS — N6320 Unspecified lump in the left breast, unspecified quadrant: Secondary | ICD-10-CM | POA: Diagnosis not present

## 2018-08-16 NOTE — H&P (Signed)
Norena Bratton Sancho Documented: 08/16/2018 10:55 AM Location: [facility] Patient #: [id number] DOB: 1950-04-02 Married / Language: Cleophus Molt / Race: White Female  History of Present Illness Marcello Moores A. Franca Stakes MD; 08/16/2018 11:17 AM) Patient words: Patient sent at the request of Dr. Radford Pax for discordant left breast biopsy. The patient has a history of a clear left nipple discharge for many years. She underwent workup with mammogram and ultrasound which showed an area of architectural distortion below the left nipple. Core biopsy showed dilation of proximal with hemorrhage but the radiographic findings supported papilloma. Since these were discordant, left breast lumpectomy was recommended. She has no history of breast cancer or family history of breast cancer. This was her first biopsy. Her nipple discharge. Has ceased Since the biopsy.                      Follow-up probably benign mass in the 11 o'clock position of the left breast. The patient reports intermittent spontaneous clear nipple discharge on the left for approximately 2 years.  EXAM: DIGITAL DIAGNOSTIC LEFT MAMMOGRAM WITH CAD AND TOMO  ULTRASOUND LEFT BREAST  COMPARISON: Previous exam(s).  ACR Breast Density Category b: There are scattered areas of fibroglandular density.  FINDINGS: Stable mammographic appearance of the left breast, including the small, oval, circumscribed mass in the upper inner retroareolar region. No interval findings suspicious for malignancy.  On physical exam, a small amount of clear nipple discharge was elicited from the inferior aspect of the left nipple. There were no palpable masses in the left breast or left axilla.  Targeted ultrasound is performed, showing a 6 x 6 x 5 mm rounded, circumscribed, hypoechoic mass in the 11 o'clock retroareolar left breast. No internal blood flow was seen power Doppler. At real time, with harmonics, this had features of a cyst  with a small amount of dependent calcification. This measured 7 x 6 x 6 mm on 01/25/2018.  In the 5 o'clock retroareolar left breast, just beneath the skin, a small, oval, solid intraductal mass is demonstrated. This measures 6 x 5 x 3 mm and has prominent internal blood flow with power Doppler.  Ultrasound of the left axilla demonstrated normal appearing left axillary lymph nodes.  Mammographic images were processed with CAD.  IMPRESSION: 1. 6 mm 5 o'clock retroareolar solid intraductal mass just beneath the skin. 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 However, malignancy cannot be excluded. 2. Interval slight decrease in size of a small, benign, complicated cyst in the 11 o'clock retroareolar left breast. This does not require further follow-up.  RECOMMENDATION: Ultrasound-guided core needle biopsy of the 6 mm mass in the 5 o'clock retroareolar left breast. This has been discussed with the patient and scheduled at 8:30 a.m. on 08/04/2018.  I have discussed the findings and recommendations with the patient. Results were also provided in writing at the conclusion of the visit. If applicable, a reminder letter will be sent to the patient regarding the next appointment.  BI-RADS CATEGORY 4: Suspicious.   Electronically Signed By: Claudie Revering M.D. On: 08/02/2018 10:52       Pathology revealed DILATED DUCTS WITH HEMORRHAGE of the LEFT breast, 5 o'clock, retroareolar. SEE COMMENT. This was found to be discordant with imaging by Dr. Curlene Dolphin, with surgical consultation for excision.                 Diagnosis Breast, left, needle core biopsy, 5 o'clock, retroareolar - DILATED DUCTS WITH HEMORRHAGE, SEE COMMENT. - NO  MALIGNANCY IDENTIFIED. Microscopic Comment Papillomatous fragments are not seen. The case was called to [facility] on 08/05/2018.  The patient is a 68 year old female.   Past Surgical History  (Tanisha A. Owens Shark, [place]; 08/16/2018 10:55 AM) Breast Biopsy multiple Cataract Surgery Bilateral. Cesarean Section - 1 Gallbladder Surgery - Laparoscopic Tonsillectomy  Diagnostic Studies History (Tanisha A. Owens Shark, [place]; 08/16/2018 10:55 AM) Colonoscopy 5-10 years ago Mammogram within last year Pap Smear 1-5 years ago  Allergies (Tanisha A. Owens Shark, [place]; 08/16/2018 10:56 AM) No Known Drug Allergies [08/16/2018]: Allergies Reconciled  Medication History (Tanisha A. Owens Shark, RMA; 08/16/2018 10:56 AM) Omeprazole (40MG  Capsule DR, Oral) Active. Triamterene-HCTZ (37.5-25MG  Capsule, Oral) Active. Medications Reconciled  Social History (Tanisha A. Owens Shark, [place]; 08/16/2018 10:55 AM) No drug use  Family History (Tanisha A. Owens Shark, [place]; 08/16/2018 10:55 AM) Breast Cancer Family Members In General. Cancer Brother. Cerebrovascular Accident Mother. Diabetes Mellitus Brother. Hypertension Mother. Prostate Cancer Brother.  Pregnancy / Birth History (Tanisha A. Owens Shark, [place]; 08/16/2018 10:55 AM) Age at menarche 50 years. Age of menopause 16-55 Gravida 1 Length (months) of breastfeeding 3-6 Maternal age >2  Other Problems (Tanisha A. Owens Shark, [place]; 08/16/2018 10:55 AM) High blood pressure Kidney Stone Migraine Headache     Review of Systems (Tanisha A. Brown RMA; 08/16/2018 10:55 AM) General Not Present- Appetite Loss, Chills, Fatigue, Fever, Night Sweats, Weight Gain and Weight Loss. Skin Not Present- Change in Wart/Mole, Dryness, Hives, Jaundice, New Lesions, Non-Healing Wounds, Rash and Ulcer. HEENT Present- Wears glasses/contact lenses. Not Present- Earache, Hearing Loss, Hoarseness, Nose Bleed, Oral Ulcers, Ringing in the Ears, Seasonal Allergies, Sinus Pain, Sore Throat, Visual Disturbances and Yellow Eyes. Respiratory Not Present- Bloody sputum, Chronic Cough, Difficulty Breathing, Snoring and Wheezing. Breast Present- Nipple Discharge. Not Present- Breast Mass, Breast  Pain and Skin Changes. Cardiovascular Present- Leg Cramps. Not Present- Chest Pain, Difficulty Breathing Lying Down, Palpitations, Rapid Heart Rate, Shortness of Breath and Swelling of Extremities. Gastrointestinal Not Present- Abdominal Pain, Bloating, Bloody Stool, Change in Bowel Habits, Chronic diarrhea, Constipation, Difficulty Swallowing, Excessive gas, Gets full quickly at meals, Hemorrhoids, Indigestion, Nausea, Rectal Pain and Vomiting. Female Genitourinary Not Present- Frequency, Nocturia, Painful Urination, Pelvic Pain and Urgency. Musculoskeletal Present- Joint Pain. Not Present- Back Pain, Joint Stiffness, Muscle Pain, Muscle Weakness and Swelling of Extremities. Neurological Not Present- Decreased Memory, Fainting, Headaches, Numbness, Seizures, Tingling, Tremor, Trouble walking and Weakness. Psychiatric Not Present- Anxiety, Bipolar, Change in Sleep Pattern, Depression, Fearful and Frequent crying. Endocrine Present- Hair Changes. Not Present- Cold Intolerance, Excessive Hunger, Heat Intolerance, Hot flashes and New Diabetes. Hematology Not Present- Blood Thinners, Easy Bruising, Excessive bleeding, Gland problems, HIV and Persistent Infections.  Vitals (Tanisha A. Brown RMA; 08/16/2018 10:56 AM) 08/16/2018 10:55 AM Weight: 175 lb Height: 66in Body Surface Area: 1.89 m Body Mass Index: 28.25 kg/m  Temp.: 98.15F  Pulse: 79 (Regular)  BP: 122/82 (Sitting, Left Arm, Standard)        Physical Exam (Abryanna Musolino A. Vernal Hritz MD; 08/16/2018 11:18 AM)  General Mental Status-Alert. General Appearance-Consistent with stated age. Hydration-Well hydrated. Voice-Normal.  Head and Neck Head-normocephalic, atraumatic with no lesions or palpable masses. Trachea-midline. Thyroid Gland Characteristics - normal size and consistency.  Chest and Lung Exam Chest and lung exam reveals -quiet, even and easy respiratory effort with no use of accessory muscles and on  auscultation, normal breath sounds, no adventitious sounds and normal vocal resonance. Inspection Chest Wall - Normal. Back - normal.  Breast Note: Left breast bruising noted. No masses  or nipple discharge. Right breast is normal.  Cardiovascular Cardiovascular examination reveals -normal heart sounds, regular rate and rhythm with no murmurs and normal pedal pulses bilaterally.  Neurologic Neurologic evaluation reveals -alert and oriented x 3 with no impairment of recent or remote memory. Mental Status-Normal.  Musculoskeletal Normal Exam - Left-Upper Extremity Strength Normal and Lower Extremity Strength Normal. Normal Exam - Right-Upper Extremity Strength Normal and Lower Extremity Strength Normal.  Lymphatic Head & Neck  General Head & Neck Lymphatics: Bilateral - Description - Normal. Axillary  General Axillary Region: Bilateral - Description - Normal. Tenderness - Non Tender.    Assessment & Plan (Royanne Warshaw A. Crystel Demarco MD; 08/16/2018 11:18 AM)  MASS OF LEFT BREAST ON MAMMOGRAM (N63.20) Impression: History of left nipple discharge which was clear. Core biopsy showed dilation of the ducts and concerns for papilloma and this was felt to be discordant. Recommend left breast lumpectomy for discordant findings on mammogram  Risk of lumpectomy include bleeding, infection, seroma, more surgery, use of seed/wire, wound care, cosmetic deformity and the need for other treatments, death , blood clots, death. Pt agrees to proceed.  Current Plans You are being scheduled for surgery- Our schedulers will call you.  You should hear from our office's scheduling department within 5 working days about the location, date, and time of surgery. We try to make accommodations for patient's preferences in scheduling surgery, but sometimes the OR schedule or the surgeon's schedule prevents [place] from making those accommodations.  If you have not heard from our office [phone]) in 5  working days, call the office and ask for your surgeon's nurse.  If you have other questions about your diagnosis, plan, or surgery, call the office and ask for your surgeon's nurse.  Pt Education - [facility] Breast Biopsy [facility]: discussed with patient and provided information.

## 2018-08-30 ENCOUNTER — Other Ambulatory Visit: Payer: Self-pay | Admitting: Surgery

## 2018-08-30 DIAGNOSIS — N6320 Unspecified lump in the left breast, unspecified quadrant: Secondary | ICD-10-CM

## 2018-09-24 ENCOUNTER — Other Ambulatory Visit: Payer: Self-pay

## 2018-09-24 ENCOUNTER — Encounter (HOSPITAL_BASED_OUTPATIENT_CLINIC_OR_DEPARTMENT_OTHER): Payer: Self-pay | Admitting: *Deleted

## 2018-09-27 ENCOUNTER — Other Ambulatory Visit (HOSPITAL_COMMUNITY)
Admission: RE | Admit: 2018-09-27 | Discharge: 2018-09-27 | Disposition: A | Discharge status: Home / Self Care | Payer: Medicare Other | Source: Ambulatory Visit | Attending: Surgery | Admitting: Surgery

## 2018-09-27 ENCOUNTER — Other Ambulatory Visit: Payer: Self-pay

## 2018-09-27 ENCOUNTER — Encounter (HOSPITAL_BASED_OUTPATIENT_CLINIC_OR_DEPARTMENT_OTHER)
Admission: RE | Admit: 2018-09-27 | Discharge: 2018-09-27 | Disposition: A | Discharge status: Home / Self Care | Payer: Medicare Other | Source: Ambulatory Visit | Attending: Surgery | Admitting: Surgery

## 2018-09-27 DIAGNOSIS — Z01812 Encounter for preprocedural laboratory examination: Secondary | ICD-10-CM | POA: Insufficient documentation

## 2018-09-27 DIAGNOSIS — Z79899 Other long term (current) drug therapy: Secondary | ICD-10-CM | POA: Diagnosis not present

## 2018-09-27 DIAGNOSIS — D242 Benign neoplasm of left breast: Secondary | ICD-10-CM | POA: Diagnosis not present

## 2018-09-27 DIAGNOSIS — Z20828 Contact with and (suspected) exposure to other viral communicable diseases: Secondary | ICD-10-CM | POA: Insufficient documentation

## 2018-09-27 DIAGNOSIS — N6002 Solitary cyst of left breast: Secondary | ICD-10-CM | POA: Diagnosis not present

## 2018-09-27 LAB — COMPREHENSIVE METABOLIC PANEL
ALT: 23 U/L (ref 0–44)
AST: 28 U/L (ref 15–41)
Albumin: 3.4 g/dL — ABNORMAL LOW (ref 3.5–5.0)
Alkaline Phosphatase: 67 U/L (ref 38–126)
Anion gap: 9 (ref 5–15)
BUN: 16 mg/dL (ref 8–23)
CO2: 23 mmol/L (ref 22–32)
Calcium: 9.2 mg/dL (ref 8.9–10.3)
Chloride: 108 mmol/L (ref 98–111)
Creatinine, Ser: 0.76 mg/dL (ref 0.44–1.00)
GFR calc Af Amer: >60 mL/min (ref >60)
GFR calc non Af Amer: >60 mL/min (ref >60)
Glucose, Bld: 119 mg/dL — ABNORMAL HIGH (ref 70–99)
Potassium: 4.3 mmol/L (ref 3.5–5.1)
Sodium: 140 mmol/L (ref 135–145)
Total Bilirubin: 0.3 mg/dL (ref 0.3–1.2)
Total Protein: 6.2 g/dL — ABNORMAL LOW (ref 6.5–8.1)

## 2018-09-27 LAB — CBC WITH DIFFERENTIAL/PLATELET
Abs Immature Granulocytes: 0.05 10*3/uL (ref 0.00–0.07)
Basophils Absolute: 0 10*3/uL (ref 0.0–0.1)
Basophils Relative: 0 %
Eosinophils Absolute: 0.2 10*3/uL (ref 0.0–0.5)
Eosinophils Relative: 2 %
HCT: 46.6 % — ABNORMAL HIGH (ref 36.0–46.0)
Hemoglobin: 14.6 g/dL (ref 12.0–15.0)
Immature Granulocytes: 1 %
Lymphocytes Relative: 28 %
Lymphs Abs: 2.6 10*3/uL (ref 0.7–4.0)
MCH: 27.3 pg (ref 26.0–34.0)
MCHC: 31.3 g/dL (ref 30.0–36.0)
MCV: 87.1 fL (ref 80.0–100.0)
Monocytes Absolute: 0.5 10*3/uL (ref 0.1–1.0)
Monocytes Relative: 5 %
Neutro Abs: 6.1 10*3/uL (ref 1.7–7.7)
Neutrophils Relative %: 64 %
Platelets: 250 10*3/uL (ref 150–400)
RBC: 5.35 MIL/uL — ABNORMAL HIGH (ref 3.87–5.11)
RDW: 14.2 % (ref 11.5–15.5)
WBC: 9.5 10*3/uL (ref 4.0–10.5)
nRBC: 0 % (ref 0.0–0.2)

## 2018-09-27 LAB — SARS CORONAVIRUS 2 (TAT 6-24 HRS): SARS Coronavirus 2: NEGATIVE

## 2018-09-27 NOTE — Progress Notes (Signed)

## 2018-09-29 ENCOUNTER — Ambulatory Visit
Admission: RE | Admit: 2018-09-29 | Discharge: 2018-09-29 | Disposition: A | Discharge status: Home / Self Care | Payer: Medicare Other | Source: Ambulatory Visit | Attending: Surgery | Admitting: Surgery

## 2018-09-29 ENCOUNTER — Other Ambulatory Visit: Payer: Self-pay

## 2018-09-29 DIAGNOSIS — N6320 Unspecified lump in the left breast, unspecified quadrant: Secondary | ICD-10-CM

## 2018-09-29 IMAGING — MG MM PLC BREAST LOC DEV 1ST LESION INC MAMMO GUIDE*L*
8 series · 8 of 8 positions shown · non-contrast
Comparison: Previous exam(s).

CLINICAL DATA: 68-year-old female for radioactive seed localization
of LEFT breast mass with discordant biopsy results.

EXAM:
MAMMOGRAPHIC GUIDED RADIOACTIVE SEED LOCALIZATION OF THE LEFT BREAST

[L CC (1 of 4)]
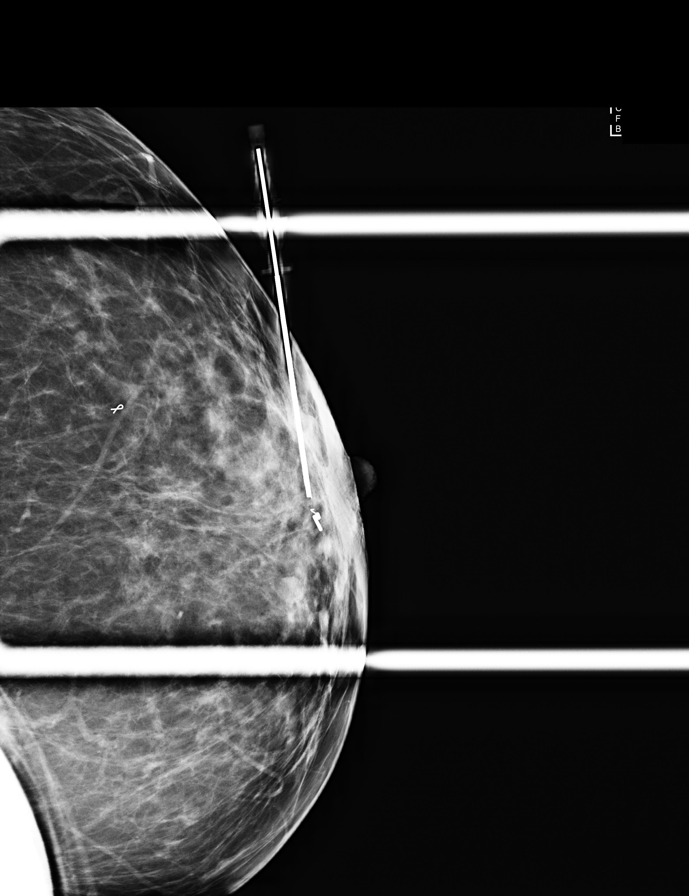

[L ML]
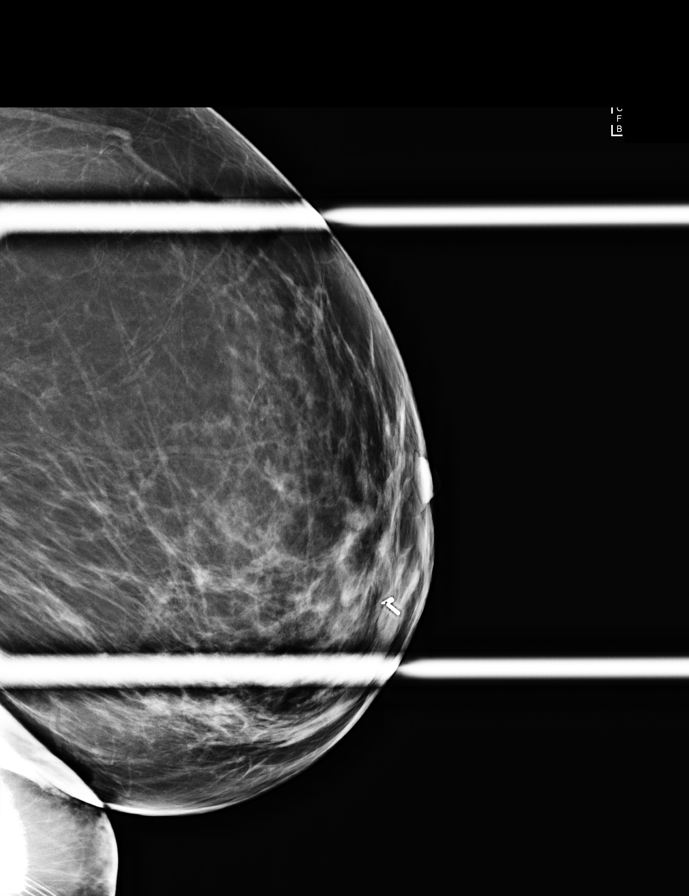

[L CC (2 of 4)]
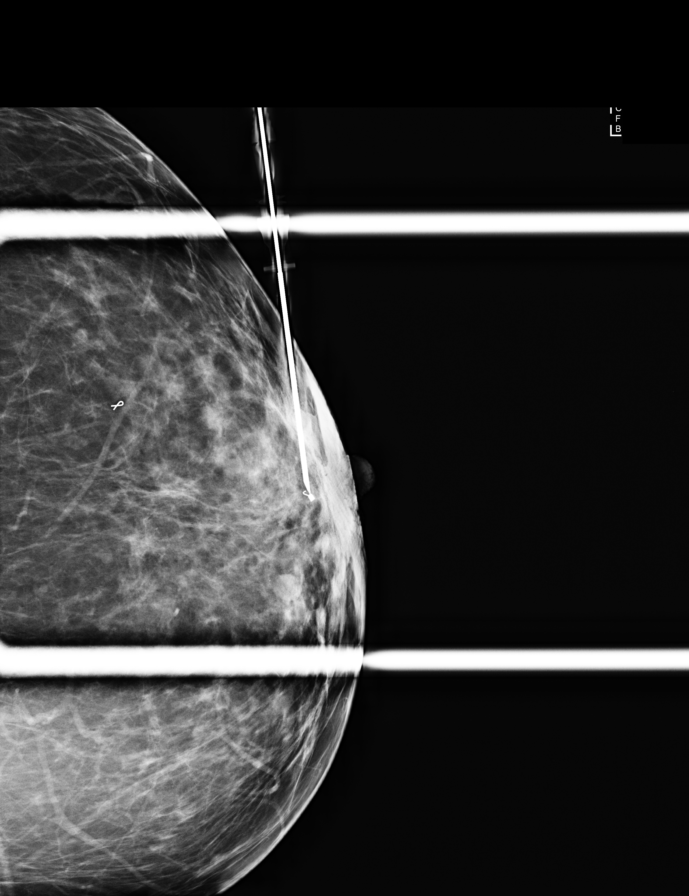

[L CC (3 of 4)]
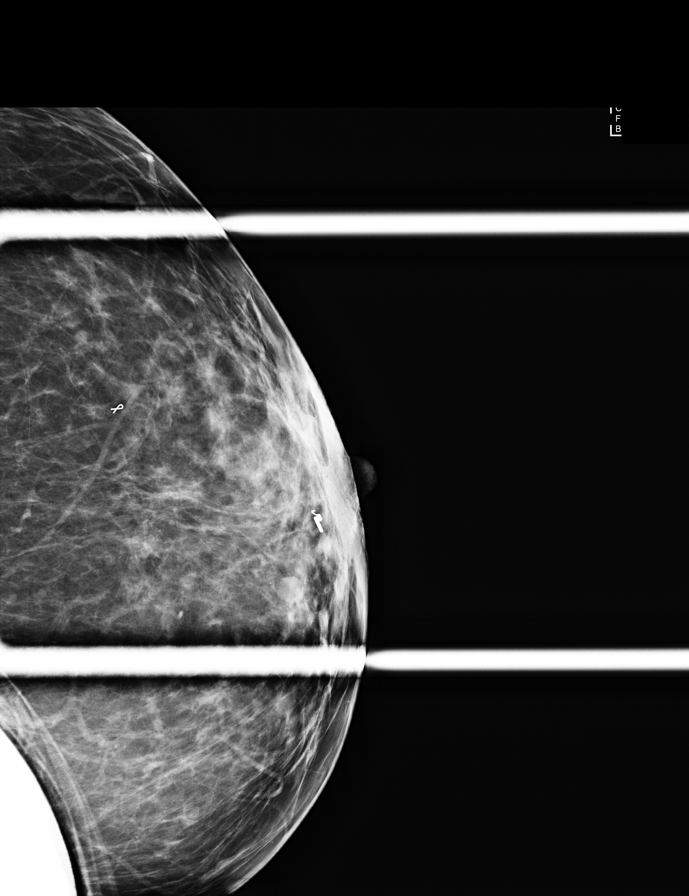

[L LM (1 of 3)]
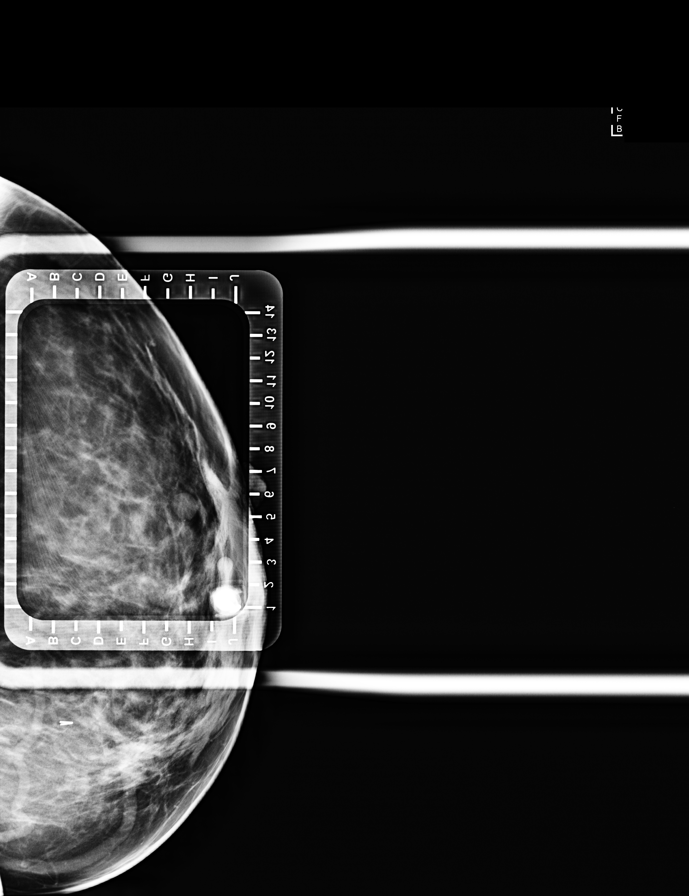

[L LM (2 of 3)]
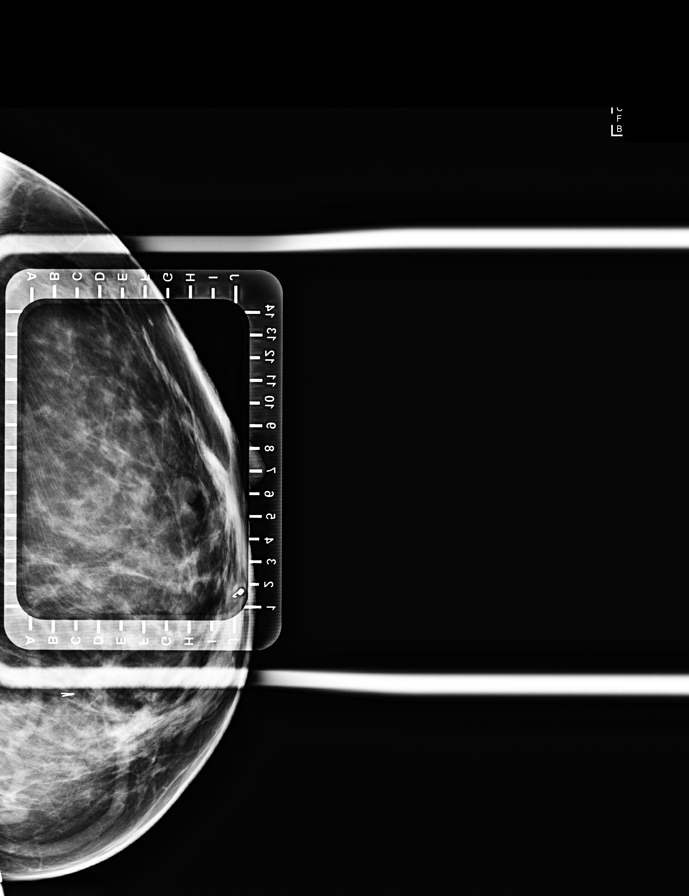

[L CC (4 of 4)]
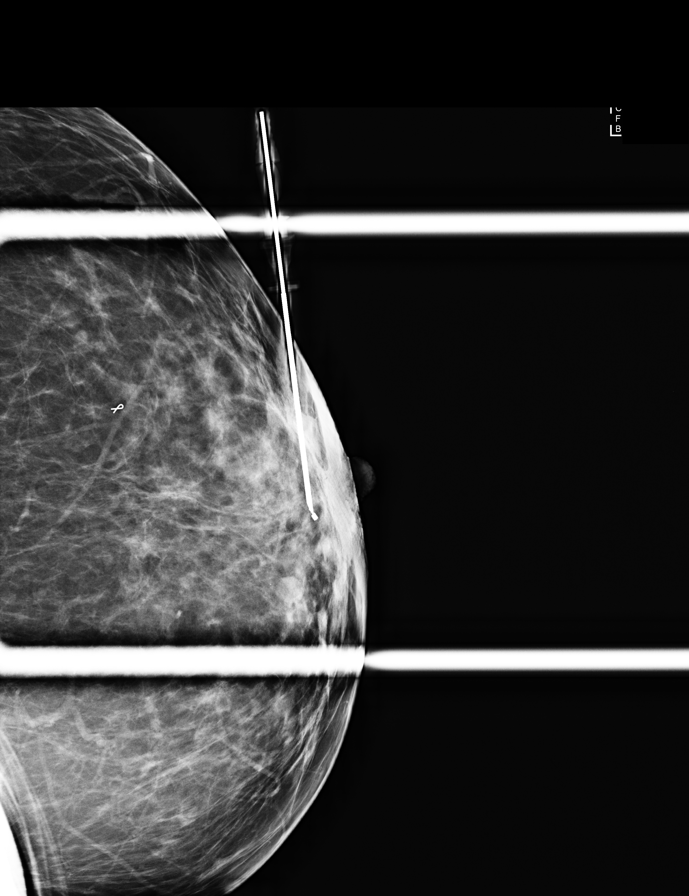

[L LM (3 of 3)]
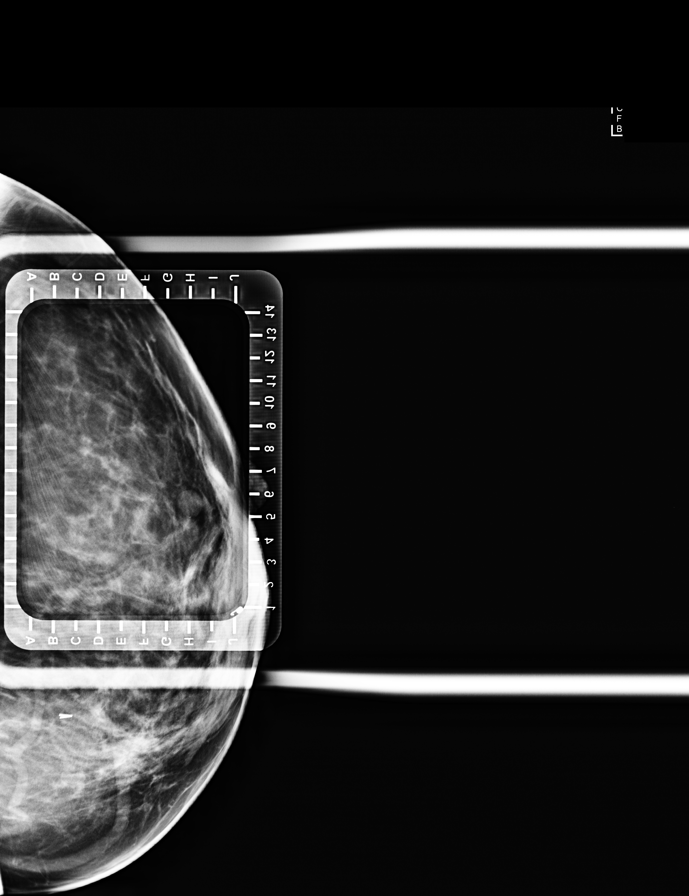

[8 of 8 positions shown; findings below may reference images not displayed]

FINDINGS: Patient presents for radioactive seed localization prior to LEFT
breast excision. I met with the patient and we discussed the
procedure of seed localization including benefits and alternatives.
We discussed the high likelihood of a successful procedure. We
discussed the risks of the procedure including infection, bleeding,
tissue injury and further surgery. We discussed the low dose of
radioactivity involved in the procedure. Informed, written consent
was given.

The usual time-out protocol was performed immediately prior to the
procedure.

Using mammographic guidance, sterile technique, 1% lidocaine and an
[AW] radioactive seed, the COIL clip was localized using a LATERAL
approach. The follow-up mammogram images confirm the seed in the
expected location and were marked for Dr. LUGEN.

Follow-up survey of the patient confirms presence of the radioactive
seed.

Order number of [AW] seed:  [PHONE_NUMBER].

Total activity:  0.257 millicuries.  Reference Date: [DATE].

The patient tolerated the procedure well and was released from the
[REDACTED]. She was given instructions regarding seed removal.
IMPRESSION: Radioactive seed localization LEFT breast. No apparent
complications.

## 2018-09-29 NOTE — Anesthesia Preprocedure Evaluation (Addendum)
Anesthesia Evaluation  Patient identified by MRN, date of birth, ID band Patient awake    Reviewed: Allergy & Precautions, NPO status , Patient's Chart, lab work & pertinent test results  History of Anesthesia Complications (+) PONV and history of anesthetic complications  Airway Mallampati: II  TM Distance: >3 FB Neck ROM: Full    Dental no notable dental hx. (+) Teeth Intact   Pulmonary neg pulmonary ROS,    Pulmonary exam normal breath sounds clear to auscultation       Cardiovascular negative cardio ROS Normal cardiovascular exam Rhythm:Regular Rate:Normal     Neuro/Psych negative neurological ROS  negative psych ROS   GI/Hepatic negative GI ROS, Neg liver ROS,   Endo/Other  Left breast mass  Renal/GU negative Renal ROS  negative genitourinary   Musculoskeletal negative musculoskeletal ROS (+)   Abdominal   Peds  Hematology negative hematology ROS (+)   Anesthesia Other Findings   Reproductive/Obstetrics                            Anesthesia Physical Anesthesia Plan  ASA: II  Anesthesia Plan: General   Post-op Pain Management:    Induction: Intravenous  PONV Risk Score and Plan: 4 or greater and Ondansetron, Dexamethasone, Treatment may vary due to age or medical condition, Scopolamine patch - Pre-op and Diphenhydramine  Airway Management Planned: LMA  Additional Equipment:   Intra-op Plan:   Post-operative Plan: Extubation in OR  Informed Consent:   Plan Discussed with:   Anesthesia Plan Comments:         Anesthesia Quick Evaluation

## 2018-09-30 ENCOUNTER — Other Ambulatory Visit: Payer: Self-pay

## 2018-09-30 ENCOUNTER — Ambulatory Visit (HOSPITAL_BASED_OUTPATIENT_CLINIC_OR_DEPARTMENT_OTHER): Payer: Medicare Other | Admitting: Certified Registered"

## 2018-09-30 ENCOUNTER — Encounter (HOSPITAL_BASED_OUTPATIENT_CLINIC_OR_DEPARTMENT_OTHER): Payer: Self-pay | Admitting: *Deleted

## 2018-09-30 ENCOUNTER — Encounter (HOSPITAL_BASED_OUTPATIENT_CLINIC_OR_DEPARTMENT_OTHER)
Admission: RE | Disposition: A | Discharge status: Home / Self Care | Payer: Self-pay | Source: Home / Self Care | Attending: Surgery

## 2018-09-30 ENCOUNTER — Ambulatory Visit (HOSPITAL_BASED_OUTPATIENT_CLINIC_OR_DEPARTMENT_OTHER)
Admission: RE | Admit: 2018-09-30 | Discharge: 2018-09-30 | Disposition: A | Discharge status: Home / Self Care | Payer: Medicare Other | Attending: Surgery | Admitting: Surgery

## 2018-09-30 ENCOUNTER — Ambulatory Visit
Admission: RE | Admit: 2018-09-30 | Discharge: 2018-09-30 | Disposition: A | Discharge status: Home / Self Care | Payer: Medicare Other | Source: Ambulatory Visit | Attending: Surgery | Admitting: Surgery

## 2018-09-30 DIAGNOSIS — N6002 Solitary cyst of left breast: Secondary | ICD-10-CM | POA: Diagnosis not present

## 2018-09-30 DIAGNOSIS — Z79899 Other long term (current) drug therapy: Secondary | ICD-10-CM | POA: Diagnosis not present

## 2018-09-30 DIAGNOSIS — N6012 Diffuse cystic mastopathy of left breast: Secondary | ICD-10-CM | POA: Diagnosis not present

## 2018-09-30 DIAGNOSIS — N6320 Unspecified lump in the left breast, unspecified quadrant: Secondary | ICD-10-CM | POA: Diagnosis not present

## 2018-09-30 DIAGNOSIS — N6489 Other specified disorders of breast: Secondary | ICD-10-CM | POA: Diagnosis not present

## 2018-09-30 DIAGNOSIS — D242 Benign neoplasm of left breast: Secondary | ICD-10-CM | POA: Diagnosis not present

## 2018-09-30 HISTORY — DX: Nausea with vomiting, unspecified: R11.2

## 2018-09-30 HISTORY — PX: BREAST LUMPECTOMY WITH RADIOACTIVE SEED LOCALIZATION: SHX6424

## 2018-09-30 HISTORY — DX: Other specified postprocedural states: Z98.890

## 2018-09-30 IMAGING — DX MM BREAST SURGICAL SPECIMEN
2 series · 4 of 4 positions shown · non-contrast
Comparison: Previous exam(s).

CLINICAL DATA: Radioactive seed localization was performed on
[DATE] prior to excisional biopsy.

EXAM:
SPECIMEN RADIOGRAPH OF THE LEFT BREAST

[Series 2: specimen digital x-ray, derived · left · 2 of 2 slices shown (1 of 2)]
[im 1/2]
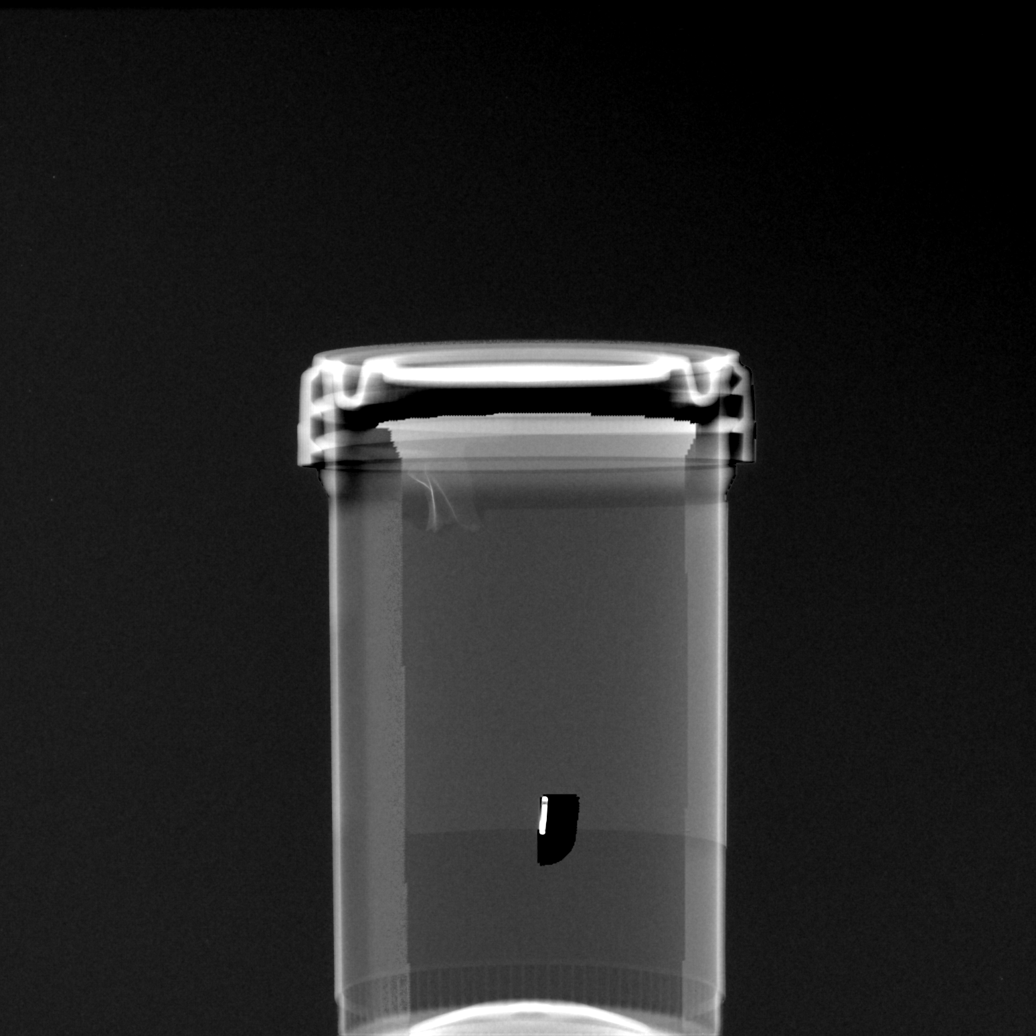
[im 2/2]
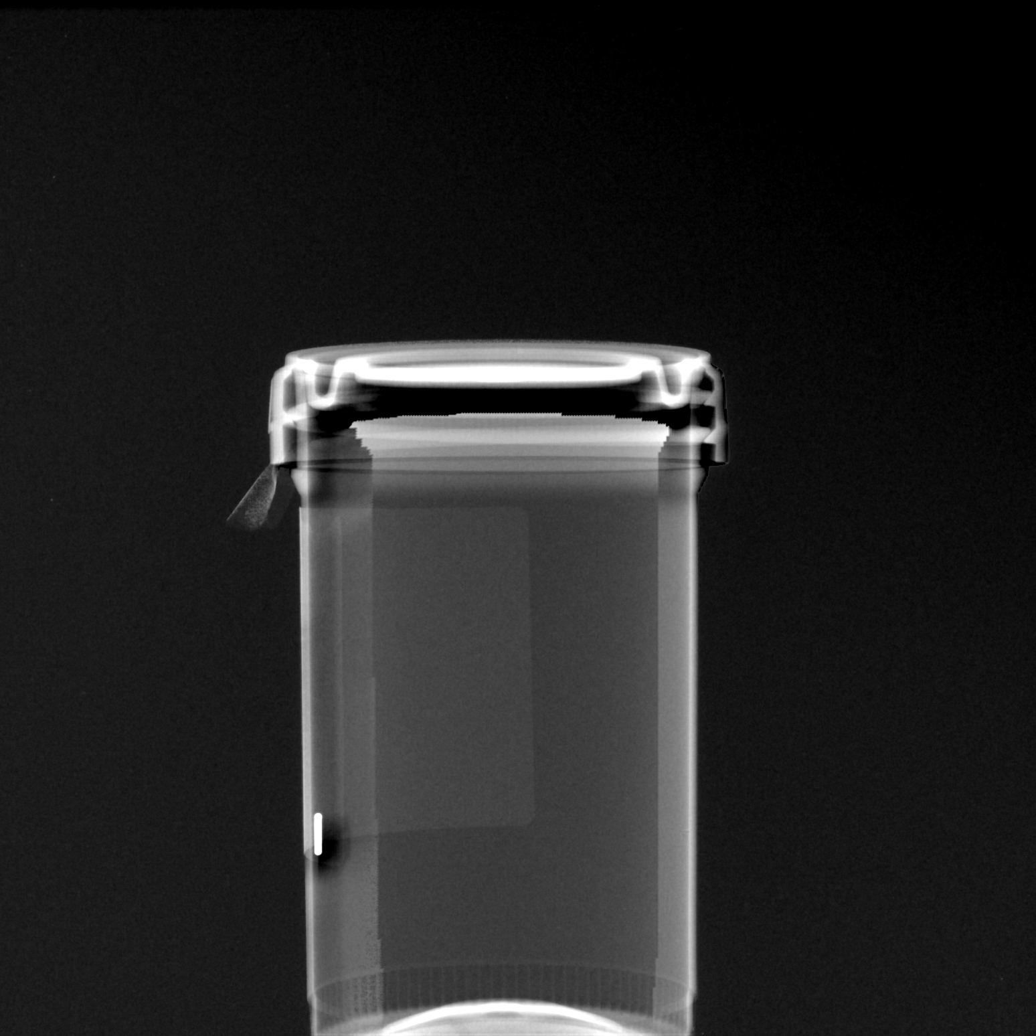

[Series 4: specimen digital x-ray, derived · left · 2 of 2 slices shown (2 of 2)]
[im 1/2]
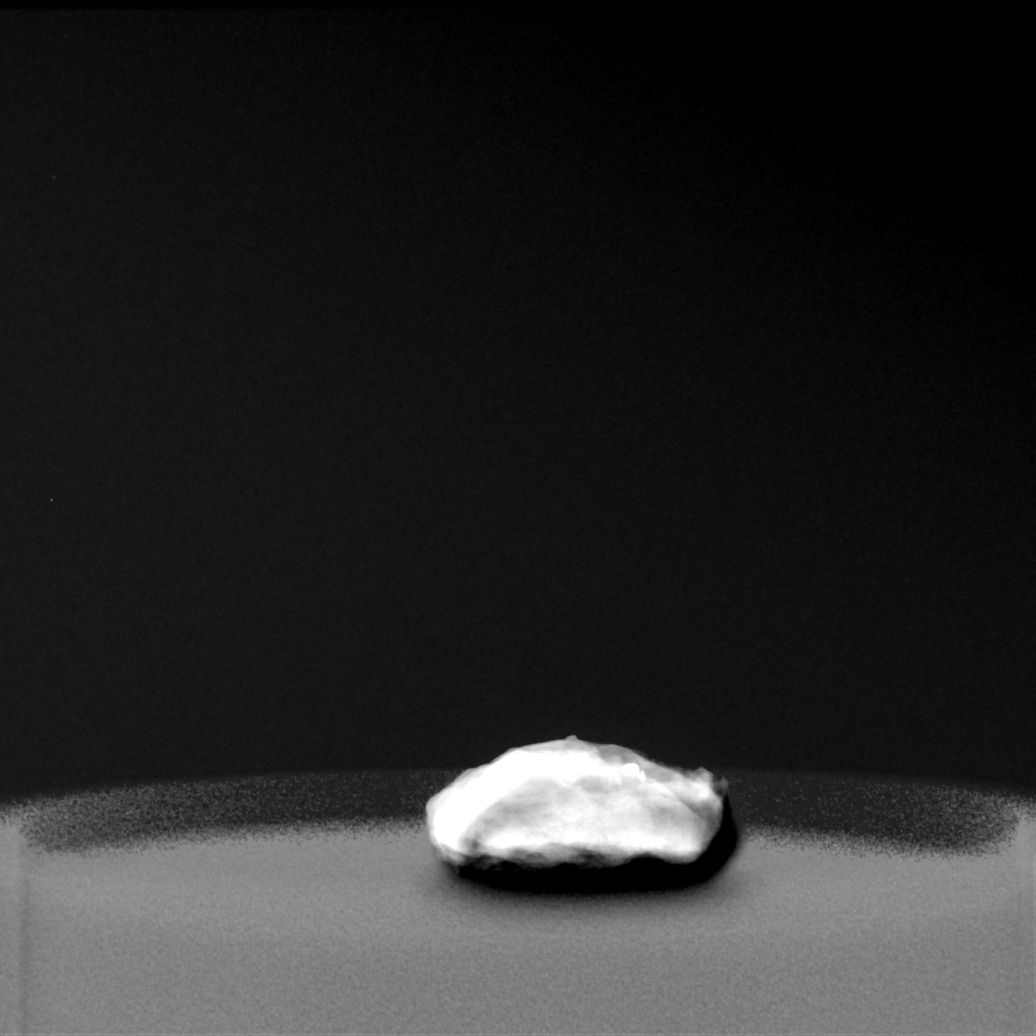
[im 2/2]
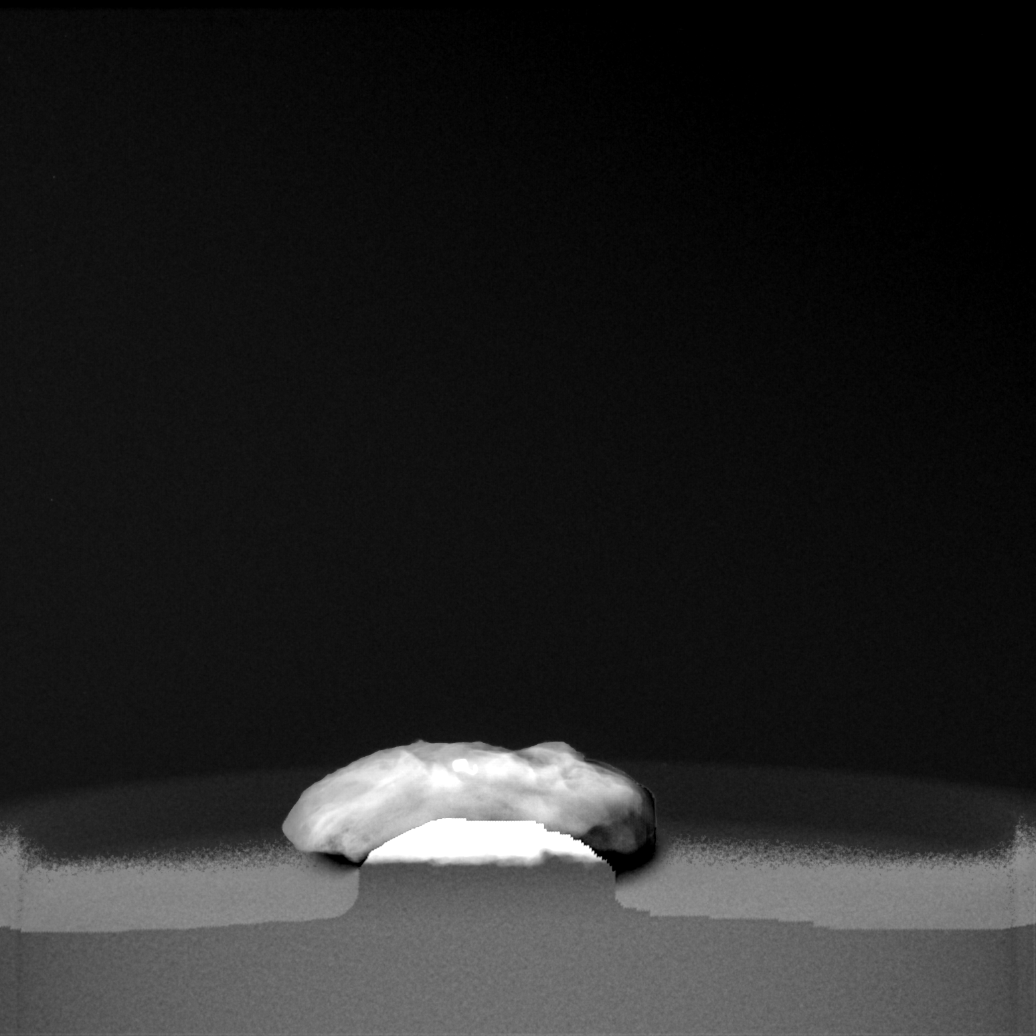

[4 of 4 positions shown; findings below may reference images not displayed]

FINDINGS: Status post excision of the left breast. The radioactive seed is
imaged in a container separate from the breast tissue. A coil shaped
biopsy marker clip is present within the breast tissue.
IMPRESSION: Specimen radiograph of the left breast.

## 2018-09-30 SURGERY — BREAST LUMPECTOMY WITH RADIOACTIVE SEED LOCALIZATION
Anesthesia: General | Site: Breast | Laterality: Left

## 2018-09-30 MED ORDER — METOCLOPRAMIDE HCL 5 MG/ML IJ SOLN: 10.0000 mg | Freq: Once | INTRAMUSCULAR | Status: DC | PRN

## 2018-09-30 MED ORDER — PROPOFOL 10 MG/ML IV BOLUS
INTRAVENOUS | Status: DC | PRN
  Administered 2018-09-30: 20 mg via INTRAVENOUS
  Administered 2018-09-30: 150 mg via INTRAVENOUS

## 2018-09-30 MED ORDER — METHYLENE BLUE 0.5 % INJ SOLN
INTRAVENOUS | Status: AC
  Filled 2018-09-30: qty 10

## 2018-09-30 MED ORDER — SCOPOLAMINE 1 MG/3DAYS TD PT72: 1.0000 | MEDICATED_PATCH | TRANSDERMAL | Status: DC

## 2018-09-30 MED ORDER — BUPIVACAINE-EPINEPHRINE 0.25% -1:200000 IJ SOLN
INTRAMUSCULAR | Status: AC
  Filled 2018-09-30: qty 1

## 2018-09-30 MED ORDER — LIDOCAINE 2% (20 MG/ML) 5 ML SYRINGE
INTRAMUSCULAR | Status: DC | PRN
  Administered 2018-09-30: 100 mg via INTRAVENOUS

## 2018-09-30 MED ORDER — SODIUM CHLORIDE (PF) 0.9 % IJ SOLN
INTRAMUSCULAR | Status: AC
  Filled 2018-09-30: qty 10

## 2018-09-30 MED ORDER — HYDROCODONE-ACETAMINOPHEN 5-325 MG PO TABS: 1.0000 | ORAL_TABLET | Freq: Four times a day (QID) | ORAL | 0 refills | Status: DC | PRN

## 2018-09-30 MED ORDER — HYDROMORPHONE HCL 1 MG/ML IJ SOLN: 0.2500 mg | INTRAMUSCULAR | Status: DC | PRN

## 2018-09-30 MED ORDER — FENTANYL CITRATE (PF) 100 MCG/2ML IJ SOLN
50.0000 ug | INTRAMUSCULAR | Status: AC | PRN
  Administered 2018-09-30: 08:00:00 50 ug via INTRAVENOUS
  Administered 2018-09-30 (×2): 25 ug via INTRAVENOUS

## 2018-09-30 MED ORDER — MIDAZOLAM HCL 2 MG/2ML IJ SOLN
INTRAMUSCULAR | Status: AC
  Filled 2018-09-30: qty 2

## 2018-09-30 MED ORDER — MIDAZOLAM HCL 2 MG/2ML IJ SOLN
1.0000 mg | INTRAMUSCULAR | Status: DC | PRN
  Administered 2018-09-30 (×2): 1 mg via INTRAVENOUS

## 2018-09-30 MED ORDER — OXYCODONE HCL 5 MG PO TABS: 5.0000 mg | ORAL_TABLET | Freq: Once | ORAL | Status: DC | PRN

## 2018-09-30 MED ORDER — LACTATED RINGERS IV SOLN
INTRAVENOUS | Status: DC
  Administered 2018-09-30: 07:00:00 via INTRAVENOUS

## 2018-09-30 MED ORDER — CHLORHEXIDINE GLUCONATE CLOTH 2 % EX PADS: 6.0000 | MEDICATED_PAD | Freq: Once | CUTANEOUS | Status: DC

## 2018-09-30 MED ORDER — DEXAMETHASONE SODIUM PHOSPHATE 10 MG/ML IJ SOLN
INTRAMUSCULAR | Status: DC | PRN
  Administered 2018-09-30: 4 mg via INTRAVENOUS

## 2018-09-30 MED ORDER — DEXTROSE 5 % IV SOLN
3.0000 g | INTRAVENOUS | Status: AC
  Administered 2018-09-30: 08:00:00 2 g via INTRAVENOUS

## 2018-09-30 MED ORDER — CEFAZOLIN SODIUM-DEXTROSE 2-4 GM/100ML-% IV SOLN
INTRAVENOUS | Status: AC
  Filled 2018-09-30: qty 100

## 2018-09-30 MED ORDER — ONDANSETRON HCL 4 MG/2ML IJ SOLN
INTRAMUSCULAR | Status: DC | PRN
  Administered 2018-09-30: 4 mg via INTRAVENOUS

## 2018-09-30 MED ORDER — OXYCODONE HCL 5 MG/5ML PO SOLN: 5.0000 mg | Freq: Once | ORAL | Status: DC | PRN

## 2018-09-30 MED ORDER — FENTANYL CITRATE (PF) 100 MCG/2ML IJ SOLN
INTRAMUSCULAR | Status: AC
  Filled 2018-09-30: qty 2

## 2018-09-30 MED ORDER — PROPOFOL 10 MG/ML IV BOLUS
INTRAVENOUS | Status: AC
  Filled 2018-09-30: qty 40

## 2018-09-30 MED ORDER — BUPIVACAINE-EPINEPHRINE (PF) 0.25% -1:200000 IJ SOLN
INTRAMUSCULAR | Status: DC | PRN
  Administered 2018-09-30: 20 mL

## 2018-09-30 SURGICAL SUPPLY — 51 items
APPLIER CLIP 9.375 MED OPEN (MISCELLANEOUS)
BINDER BREAST LRG (GAUZE/BANDAGES/DRESSINGS) IMPLANT
BINDER BREAST MEDIUM (GAUZE/BANDAGES/DRESSINGS) IMPLANT
BINDER BREAST XLRG (GAUZE/BANDAGES/DRESSINGS) ×2 IMPLANT
BINDER BREAST XXLRG (GAUZE/BANDAGES/DRESSINGS) IMPLANT
BLADE SURG 15 STRL LF DISP TIS (BLADE) ×1 IMPLANT
BLADE SURG 15 STRL SS (BLADE) ×1
CANISTER SUC SOCK COL 7IN (MISCELLANEOUS) IMPLANT
CANISTER SUCT 1200ML W/VALVE (MISCELLANEOUS) IMPLANT
CHLORAPREP W/TINT 26 (MISCELLANEOUS) ×2 IMPLANT
CLIP APPLIE 9.375 MED OPEN (MISCELLANEOUS) IMPLANT
COVER BACK TABLE REUSABLE LG (DRAPES) ×2 IMPLANT
COVER MAYO STAND REUSABLE (DRAPES) ×2 IMPLANT
COVER PROBE W GEL 5X96 (DRAPES) ×2 IMPLANT
COVER WAND RF STERILE (DRAPES) IMPLANT
DECANTER SPIKE VIAL GLASS SM (MISCELLANEOUS) IMPLANT
DERMABOND ADVANCED (GAUZE/BANDAGES/DRESSINGS) ×1
DERMABOND ADVANCED .7 DNX12 (GAUZE/BANDAGES/DRESSINGS) ×1 IMPLANT
DRAPE LAPAROSCOPIC ABDOMINAL (DRAPES) IMPLANT
DRAPE LAPAROTOMY 100X72 PEDS (DRAPES) ×2 IMPLANT
DRAPE UTILITY XL STRL (DRAPES) ×2 IMPLANT
ELECT COATED BLADE 2.86 ST (ELECTRODE) ×2 IMPLANT
ELECT REM PT RETURN 9FT ADLT (ELECTROSURGICAL) ×2
ELECTRODE REM PT RTRN 9FT ADLT (ELECTROSURGICAL) ×1 IMPLANT
GLOVE BIO SURGEON STRL SZ7 (GLOVE) ×2 IMPLANT
GLOVE BIOGEL PI IND STRL 7.0 (GLOVE) ×1 IMPLANT
GLOVE BIOGEL PI IND STRL 7.5 (GLOVE) ×1 IMPLANT
GLOVE BIOGEL PI IND STRL 8 (GLOVE) ×1 IMPLANT
GLOVE BIOGEL PI INDICATOR 7.0 (GLOVE) ×1
GLOVE BIOGEL PI INDICATOR 7.5 (GLOVE) ×1
GLOVE BIOGEL PI INDICATOR 8 (GLOVE) ×1
GLOVE ECLIPSE 8.0 STRL XLNG CF (GLOVE) ×2 IMPLANT
GOWN STRL REUS W/ TWL LRG LVL3 (GOWN DISPOSABLE) ×2 IMPLANT
GOWN STRL REUS W/TWL LRG LVL3 (GOWN DISPOSABLE) ×2
HEMOSTAT ARISTA ABSORB 3G PWDR (HEMOSTASIS) IMPLANT
HEMOSTAT SNOW SURGICEL 2X4 (HEMOSTASIS) IMPLANT
KIT MARKER MARGIN INK (KITS) ×2 IMPLANT
NEEDLE HYPO 25X1 1.5 SAFETY (NEEDLE) ×2 IMPLANT
NS IRRIG 1000ML POUR BTL (IV SOLUTION) ×2 IMPLANT
PACK BASIN DAY SURGERY FS (CUSTOM PROCEDURE TRAY) ×2 IMPLANT
PENCIL BUTTON HOLSTER BLD 10FT (ELECTRODE) ×2 IMPLANT
SLEEVE SCD COMPRESS KNEE MED (MISCELLANEOUS) ×2 IMPLANT
SPONGE LAP 4X18 RFD (DISPOSABLE) ×2 IMPLANT
SUT MNCRL AB 4-0 PS2 18 (SUTURE) ×2 IMPLANT
SUT SILK 2 0 SH (SUTURE) IMPLANT
SUT VICRYL 3-0 CR8 SH (SUTURE) ×2 IMPLANT
SYR CONTROL 10ML LL (SYRINGE) ×2 IMPLANT
TOWEL GREEN STERILE FF (TOWEL DISPOSABLE) ×2 IMPLANT
TRAY FAXITRON CT DISP (TRAY / TRAY PROCEDURE) ×2 IMPLANT
TUBE CONNECTING 20X1/4 (TUBING) IMPLANT
YANKAUER SUCT BULB TIP NO VENT (SUCTIONS) IMPLANT

## 2018-09-30 NOTE — Transfer of Care (Signed)
Immediate Anesthesia Transfer of Care Note  Patient: Stacy Clayton  Procedure(s) Performed: LEFT BREAST LUMPECTOMY WITH RADIOACTIVE SEED LOCALIZATION (Left Breast)  Patient Location: PACU  Anesthesia Type:General  Level of Consciousness: drowsy  Airway & Oxygen Therapy: Patient Spontanous Breathing and Patient connected to face mask oxygen  Post-op Assessment: Report given to RN and Post -op Vital signs reviewed and stable  Post vital signs: Reviewed and stable  Last Vitals:  Vitals Value Taken Time  BP 150/89 09/30/18 0827  Temp    Pulse 74 09/30/18 0827  Resp 11 09/30/18 0827  SpO2 100 % 09/30/18 0827  Vitals shown include unvalidated device data.  Last Pain:  Vitals:   09/30/18 0628  TempSrc: Oral  PainSc: 0-No pain      Patients Stated Pain Goal: 0 ([phone] [id number])  Complications: No apparent anesthesia complications

## 2018-09-30 NOTE — Op Note (Signed)
Preoperative diagnosis: Left breast mass  Postop diagnosis: Same  Procedure: Left breast seed localized lumpectomy  Surgeon: Thomas Cornett, MD  Anesthesia: LMA with local  EBL: Minimal  Drains: None  Specimen: Left breast tissue to pathology with seed and clip  IV fluids: Per anesthesia record  Indications for procedure: The patient presents for left breast lumpectomy secondary to an abnormal screening mammogram.  This led to diagnostic mammography and core biopsy of an abnormality located at about 6:00 at the nipple areolar complex.  Core biopsy was done and this was discordant with the radiographic findings.  Excision recommended for definitive diagnosis.The procedure has been discussed with the patient. Alternatives to surgery have been discussed with the patient.  Risks of surgery include bleeding,  Infection,  Seroma formation, death,  and the need for further surgery.   The patient understands and wishes to proceed.  Description of procedure: The patient was met in the holding area.  Questions were answered.  Neoprobe used to verify seed location in the left breast.  She was taken back to the operative room.  She is placed supine upon the operating table.  After induction of general esthesia, left breast was prepped and draped in sterile fashion.  Timeout was performed.  Proper patient, site and procedure were verified.  She received preoperative antibiotics.  Neoprobe used to identify the seed at 6:00 at the nipple areolar complex interface.  Curvilinear incision made over this.  Dissection was carried down and the lesion was extremely superficial.  The seed was removed separately and put in a separate container.  The mass was then excised.  Faxitron revealed both the seed to be present in the container and clip in the tissue.  Hemostasis achieved.  Wound closed in layers with a deep layer 3-0 Vicryl and 4-0 Monocryl.  Dermabond applied.  All final counts found to be correct.  Patient was  awoke extubated taken recovery in satisfactory condition. 

## 2018-09-30 NOTE — Discharge Instructions (Signed)
[facility] °Office Phone Number [phone] ° °BREAST BIOPSY/ PARTIAL MASTECTOMY: POST OP INSTRUCTIONS ° °Always review your discharge instruction sheet given to you by the facility where your surgery was performed. ° °IF YOU HAVE DISABILITY OR FAMILY LEAVE FORMS, YOU MUST BRING THEM TO THE OFFICE FOR PROCESSING.  DO NOT GIVE THEM TO YOUR DOCTOR. ° °1. A prescription for pain medication may be given to you upon discharge.  Take your pain medication as prescribed, if needed.  If narcotic pain medicine is not needed, then you may take acetaminophen (Tylenol) or ibuprofen (Advil) as needed. °2. Take your usually prescribed medications unless otherwise directed °3. If you need a refill on your pain medication, please contact your pharmacy.  They will contact our office to request authorization.  Prescriptions will not be filled after 5pm or on week-ends. °4. You should eat very light the first 24 hours after surgery, such as soup, crackers, pudding, etc.  Resume your normal diet the day after surgery. °5. Most patients will experience some swelling and bruising in the breast.  Ice packs and a good support bra will help.  Swelling and bruising can take several days to resolve.  °6. It is common to experience some constipation if taking pain medication after surgery.  Increasing fluid intake and taking a stool softener will usually help or prevent this problem from occurring.  A mild laxative (Milk of Magnesia or Miralax) should be taken according to package directions if there are no bowel movements after 48 hours. °7. Unless discharge instructions indicate otherwise, you may remove your bandages 24-48 hours after surgery, and you may shower at that time.  You may have steri-strips (small skin tapes) in place directly over the incision.  These strips should be left on the skin for 7-10 days.  If your surgeon used skin glue on the incision, you may shower in 24 hours.  The glue will flake off over the  next 2-3 weeks.  Any sutures or staples will be removed at the office during your follow-up visit. °8. ACTIVITIES:  You may resume regular daily activities (gradually increasing) beginning the next day.  Wearing a good support bra or sports bra minimizes pain and swelling.  You may have sexual intercourse when it is comfortable. °a. You may drive when you no longer are taking prescription pain medication, you can comfortably wear a seatbelt, and you can safely maneuver your car and apply brakes. °b. RETURN TO WORK:  ______________________________________________________________________________________ °9. You should see your doctor in the office for a follow-up appointment approximately two weeks after your surgery.  Your doctor’s nurse will typically make your follow-up appointment when she calls you with your pathology report.  Expect your pathology report 2-3 business days after your surgery.  You may call to check if you do not hear from us after three days. °10. OTHER INSTRUCTIONS: _______________________________________________________________________________________________ _____________________________________________________________________________________________________________________________________ °_____________________________________________________________________________________________________________________________________ °_____________________________________________________________________________________________________________________________________ ° °WHEN TO CALL YOUR DOCTOR: °1. Fever over 101.0 °2. Nausea and/or vomiting. °3. Extreme swelling or bruising. °4. Continued bleeding from incision. °5. Increased pain, redness, or drainage from the incision. ° °The clinic staff is available to answer your questions during regular business hours.  Please don’t hesitate to call and ask to speak to one of the nurses for clinical concerns.  If you have a medical emergency, go to the nearest  emergency room or call 911.  A surgeon from [facility] is always on call at the hospital. ° °For further questions, please visit [email or web address]  ° ° ° ° °  Post Anesthesia Home Care Instructions ° °Activity: °Get plenty of rest for the remainder of the day. A responsible individual must stay with you for 24 hours following the procedure.  °For the next 24 hours, DO NOT: °-Drive a car °-Operate machinery °-Drink alcoholic beverages °-Take any medication unless instructed by your physician °-Make any legal decisions or sign important papers. ° °Meals: °Start with liquid foods such as gelatin or soup. Progress to regular foods as tolerated. Avoid greasy, spicy, heavy foods. If nausea and/or vomiting occur, drink only clear liquids until the nausea and/or vomiting subsides. Call your physician if vomiting continues. ° °Special Instructions/Symptoms: °Your throat may feel dry or sore from the anesthesia or the breathing tube placed in your throat during surgery. If this causes discomfort, gargle with warm salt water. The discomfort should disappear within 24 hours. ° °If you had a scopolamine patch placed behind your ear for the management of post- operative nausea and/or vomiting: ° °1. The medication in the patch is effective for 72 hours, after which it should be removed.  Wrap patch in a tissue and discard in the trash. Wash hands thoroughly with soap and water. °2. You may remove the patch earlier than 72 hours if you experience unpleasant side effects which may include dry mouth, dizziness or visual disturbances. °3. Avoid touching the patch. Wash your hands with soap and water after contact with the patch. °  ° °

## 2018-09-30 NOTE — Anesthesia Procedure Notes (Signed)
Procedure Name: LMA Insertion Date/Time: 09/30/2018 7:43 AM Performed by: Lavonia Dana, CRNA Pre-anesthesia Checklist: Patient identified, Emergency Drugs available, Suction available and Patient being monitored Patient Re-evaluated:Patient Re-evaluated prior to induction Oxygen Delivery Method: Circle system utilized Preoxygenation: Pre-oxygenation with 100% oxygen Induction Type: IV induction Ventilation: Mask ventilation without difficulty LMA: LMA inserted LMA Size: 4.0 Number of attempts: 1 Airway Equipment and Method: Bite block Placement Confirmation: positive ETCO2 Tube secured with: Tape Dental Injury: Teeth and Oropharynx as per pre-operative assessment

## 2018-09-30 NOTE — H&P (Signed)
Stacy Clayton Location: [facility] Patient #: [id number] DOB: [birth year] Married / Language: English / Race: White Female  History of Present Illness Marcello Moores A. Raseel Jans MD; 08/16/2018 11:17 AM) Patient words: Patient sent at the request of Dr. Radford Pax for discordant left breast biopsy. The patient has a history of a clear left nipple discharge for many years. She underwent workup with mammogram and ultrasound which showed an area of architectural distortion below the left nipple. Core biopsy showed dilation of proximal with hemorrhage but the radiographic findings supported papilloma. Since these were discordant, left breast lumpectomy was recommended. She has no history of breast cancer or family history of breast cancer. This was her first biopsy. Her nipple discharge. Has ceased Since the biopsy.                      Follow-up probably benign mass in the 11 o'clock position of the left breast. The patient reports intermittent spontaneous clear nipple discharge on the left for approximately 2 years.  EXAM: DIGITAL DIAGNOSTIC LEFT MAMMOGRAM WITH CAD AND TOMO  ULTRASOUND LEFT BREAST  COMPARISON: Previous exam(s).  ACR Breast Density Category b: There are scattered areas of fibroglandular density.  FINDINGS: Stable mammographic appearance of the left breast, including the small, oval, circumscribed mass in the upper inner retroareolar region. No interval findings suspicious for malignancy.  On physical exam, a small amount of clear nipple discharge was elicited from the inferior aspect of the left nipple. There were no palpable masses in the left breast or left axilla.  Targeted ultrasound is performed, showing a 6 x 6 x 5 mm rounded, circumscribed, hypoechoic mass in the 11 o'clock retroareolar left breast. No internal blood flow was seen power Doppler. At real time, with harmonics, this had features of a cyst with a  small amount of dependent calcification. This measured 7 x 6 x 6 mm on 01/25/2018.  In the 5 o'clock retroareolar left breast, just beneath the skin, a small, oval, solid intraductal mass is demonstrated. This measures 6 x 5 x 3 mm and has prominent internal blood flow with power Doppler.  Ultrasound of the left axilla demonstrated normal appearing left axillary lymph nodes.  Mammographic images were processed with CAD.  IMPRESSION: 1. 6 mm 5 o'clock retroareolar solid intraductal mass just beneath the skin. 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 However, malignancy cannot be excluded. 2. Interval slight decrease in size of a small, benign, complicated cyst in the 11 o'clock retroareolar left breast. This does not require further follow-up.  RECOMMENDATION: Ultrasound-guided core needle biopsy of the 6 mm mass in the 5 o'clock retroareolar left breast. This has been discussed with the patient and scheduled at 8:30 a.m. on 08/04/2018.  I have discussed the findings and recommendations with the patient. Results were also provided in writing at the conclusion of the visit. If applicable, a reminder letter will be sent to the patient regarding the next appointment.  BI-RADS CATEGORY 4: Suspicious.   Electronically Signed By: Claudie Revering M.D. On: 08/02/2018 10:52       Pathology revealed DILATED DUCTS WITH HEMORRHAGE of the LEFT breast, 5 o'clock, retroareolar. SEE COMMENT. This was found to be discordant with imaging by Dr. Curlene Dolphin, with surgical consultation for excision.                 Diagnosis Breast, left, needle core biopsy, 5 o'clock, retroareolar - DILATED DUCTS WITH HEMORRHAGE, SEE COMMENT. - NO MALIGNANCY IDENTIFIED. Microscopic Comment  Papillomatous fragments are not seen. The case was called to [facility] on 08/05/2018.  The patient is a 68 year old  female.   Past Surgical History  Breast Biopsy multiple Cataract Surgery Bilateral. Cesarean Section - 1 Gallbladder Surgery - Laparoscopic Tonsillectomy  Diagnostic Studies History  Colonoscopy 5-10 years ago Mammogram within last year Pap Smear 1-5 years ago  Allergies  No Known Drug Allergies [08/16/2018]: Allergies Reconciled  Medication History  Omeprazole (40MG  Capsule DR, Oral) Active. Triamterene-HCTZ (37.5-25MG  Capsule, Oral) Active. Medications Reconciled  Social History  No drug use  Family History  Breast Cancer Family Members In General. Cancer Brother. Cerebrovascular Accident Mother. Diabetes Mellitus Brother. Hypertension Mother. Prostate Cancer Brother.  Pregnancy / Birth History  Age at menarche 41 years. Age of menopause 81-55 Gravida 1 Length (months) of breastfeeding 3-6 Maternal age >80  Other Problems High blood pressure Kidney Stone Migraine Headache     Review of Systems  General Not Present- Appetite Loss, Chills, Fatigue, Fever, Night Sweats, Weight Gain and Weight Loss. Skin Not Present- Change in Wart/Mole, Dryness, Hives, Jaundice, New Lesions, Non-Healing Wounds, Rash and Ulcer. HEENT Present- Wears glasses/contact lenses. Not Present- Earache, Hearing Loss, Hoarseness, Nose Bleed, Oral Ulcers, Ringing in the Ears, Seasonal Allergies, Sinus Pain, Sore Throat, Visual Disturbances and Yellow Eyes. Respiratory Not Present- Bloody sputum, Chronic Cough, Difficulty Breathing, Snoring and Wheezing. Breast Present- Nipple Discharge. Not Present- Breast Mass, Breast Pain and Skin Changes. Cardiovascular Present- Leg Cramps. Not Present- Chest Pain, Difficulty Breathing Lying Down, Palpitations, Rapid Heart Rate, Shortness of Breath and Swelling of Extremities. Gastrointestinal Not Present- Abdominal Pain, Bloating, Bloody Stool, Change in Bowel Habits, Chronic diarrhea, Constipation, Difficulty  Swallowing, Excessive gas, Gets full quickly at meals, Hemorrhoids, Indigestion, Nausea, Rectal Pain and Vomiting. Female Genitourinary Not Present- Frequency, Nocturia, Painful Urination, Pelvic Pain and Urgency. Musculoskeletal Present- Joint Pain. Not Present- Back Pain, Joint Stiffness, Muscle Pain, Muscle Weakness and Swelling of Extremities. Neurological Not Present- Decreased Memory, Fainting, Headaches, Numbness, Seizures, Tingling, Tremor, Trouble walking and Weakness. Psychiatric Not Present- Anxiety, Bipolar, Change in Sleep Pattern, Depression, Fearful and Frequent crying. Endocrine Present- Hair Changes. Not Present- Cold Intolerance, Excessive Hunger, Heat Intolerance, Hot flashes and New Diabetes. Hematology Not Present- Blood Thinners, Easy Bruising, Excessive bleeding, Gland problems, HIV and Persistent Infections.  Vitals  08/16/2018 10:55 AM Weight: 175 lb Height: 66in Body Surface Area: 1.89 m Body Mass Index: 28.25 kg/m  Temp.: 98.83F  Pulse: 79 (Regular)  BP: 122/82 (Sitting, Left Arm, Standard)        Physical Exam   General Mental Status-Alert. General Appearance-Consistent with stated age. Hydration-Well hydrated. Voice-Normal.  Head and Neck Head-normocephalic, atraumatic with no lesions or palpable masses. Trachea-midline. Thyroid Gland Characteristics - normal size and consistency.  Chest and Lung Exam Chest and lung exam reveals -quiet, even and easy respiratory effort with no use of accessory muscles and on auscultation, normal breath sounds, no adventitious sounds and normal vocal resonance. Inspection Chest Wall - Normal. Back - normal.  Breast Note: Left breast bruising noted. No masses or nipple discharge. Right breast is normal.  Cardiovascular Cardiovascular examination reveals -normal heart sounds, regular rate and rhythm with no murmurs and normal pedal pulses  bilaterally.  Neurologic Neurologic evaluation reveals -alert and oriented x 3 with no impairment of recent or remote memory. Mental Status-Normal.  Musculoskeletal Normal Exam - Left-Upper Extremity Strength Normal and Lower Extremity Strength Normal. Normal Exam - Right-Upper Extremity Strength Normal and Lower Extremity  Strength Normal.  Lymphatic Head & Neck  General Head & Neck Lymphatics: Bilateral - Description - Normal. Axillary  General Axillary Region: Bilateral - Description - Normal. Tenderness - Non Tender.    Assessment & Plan  MASS OF LEFT BREAST ON MAMMOGRAM (N63.20) Impression: History of left nipple discharge which was clear. Core biopsy showed dilation of the ducts and concerns for papilloma and this was felt to be discordant. Recommend left breast lumpectomy for discordant findings on mammogram  Risk of lumpectomy include bleeding, infection, seroma, more surgery, use of seed/wire, wound care, cosmetic deformity and the need for other treatments, death , blood clots, death. Pt agrees to proceed.  Current Plans You are being scheduled for surgery- Our schedulers will call you.  You should hear from our office's scheduling department within 5 working days about the location, date, and time of surgery. We try to make accommodations for patient's preferences in scheduling surgery, but sometimes the OR schedule or the surgeon's schedule prevents [place] from making those accommodations.  If you have not heard from our office [phone]) in 5 working days, call the office and ask for your surgeon's nurse.  If you have other questions about your diagnosis, plan, or surgery, call the office and ask for your surgeon's nurse.  Pt Education - [facility] Breast Biopsy HCI: discussed with patient and provided information.

## 2018-09-30 NOTE — Interval H&P Note (Signed)
History and Physical Interval Note:  09/30/2018 7:19 AM  Stacy Clayton  has presented today for surgery, with the diagnosis of LEFT BREAST MASS.  The various methods of treatment have been discussed with the patient and family. After consideration of risks, benefits and other options for treatment, the patient has consented to  Procedure(s): LEFT BREAST LUMPECTOMY WITH RADIOACTIVE SEED LOCALIZATION (Left) as a surgical intervention.  The patient's history has been reviewed, patient examined, no change in status, stable for surgery.  I have reviewed the patient's chart and labs.  Questions were answered to the patient's satisfaction.     [place]

## 2018-09-30 NOTE — Anesthesia Postprocedure Evaluation (Signed)
Anesthesia Post Note  Patient: Stacy Clayton  Procedure(s) Performed: LEFT BREAST LUMPECTOMY WITH RADIOACTIVE SEED LOCALIZATION (Left Breast)     Patient location during evaluation: PACU Anesthesia Type: General Level of consciousness: awake and alert and oriented Pain management: pain level controlled Vital Signs Assessment: post-procedure vital signs reviewed and stable Respiratory status: spontaneous breathing, nonlabored ventilation and respiratory function stable Cardiovascular status: blood pressure returned to baseline and stable Postop Assessment: no apparent nausea or vomiting Anesthetic complications: no    Last Vitals:  Vitals:   09/30/18 0900 09/30/18 0911  BP: (!) 151/86 (!) 157/88  Pulse: 77 81  Resp: 16 16  Temp:  36.6 C  SpO2: 97% 98%    Last Pain:  Vitals:   09/30/18 0911  TempSrc:   PainSc: 0-No pain                 Tajah Noguchi A.

## 2018-10-01 ENCOUNTER — Encounter (HOSPITAL_BASED_OUTPATIENT_CLINIC_OR_DEPARTMENT_OTHER): Payer: Self-pay | Admitting: Surgery

## 2018-10-04 LAB — SURGICAL PATHOLOGY

## 2018-10-14 DIAGNOSIS — Z961 Presence of intraocular lens: Secondary | ICD-10-CM | POA: Diagnosis not present

## 2018-11-26 DIAGNOSIS — L638 Other alopecia areata: Secondary | ICD-10-CM | POA: Diagnosis not present

## 2018-11-26 DIAGNOSIS — R531 Weakness: Secondary | ICD-10-CM | POA: Diagnosis not present

## 2018-12-14 DIAGNOSIS — Z9181 History of falling: Secondary | ICD-10-CM | POA: Diagnosis not present

## 2018-12-14 DIAGNOSIS — Z1331 Encounter for screening for depression: Secondary | ICD-10-CM | POA: Diagnosis not present

## 2018-12-14 DIAGNOSIS — M25551 Pain in right hip: Secondary | ICD-10-CM | POA: Diagnosis not present

## 2018-12-14 DIAGNOSIS — M7061 Trochanteric bursitis, right hip: Secondary | ICD-10-CM | POA: Diagnosis not present

## 2018-12-15 DIAGNOSIS — I10 Essential (primary) hypertension: Secondary | ICD-10-CM | POA: Diagnosis not present

## 2018-12-15 DIAGNOSIS — Z0000 Encounter for general adult medical examination without abnormal findings: Secondary | ICD-10-CM | POA: Diagnosis not present

## 2018-12-15 DIAGNOSIS — K219 Gastro-esophageal reflux disease without esophagitis: Secondary | ICD-10-CM | POA: Diagnosis not present

## 2018-12-15 DIAGNOSIS — E785 Hyperlipidemia, unspecified: Secondary | ICD-10-CM | POA: Diagnosis not present

## 2018-12-15 DIAGNOSIS — R7309 Other abnormal glucose: Secondary | ICD-10-CM | POA: Diagnosis not present

## 2019-01-25 DIAGNOSIS — Z124 Encounter for screening for malignant neoplasm of cervix: Secondary | ICD-10-CM | POA: Diagnosis not present

## 2019-01-25 DIAGNOSIS — Z1231 Encounter for screening mammogram for malignant neoplasm of breast: Secondary | ICD-10-CM | POA: Diagnosis not present

## 2019-01-25 DIAGNOSIS — Z809 Family history of malignant neoplasm, unspecified: Secondary | ICD-10-CM | POA: Diagnosis not present

## 2019-01-25 DIAGNOSIS — Z6829 Body mass index (BMI) 29.0-29.9, adult: Secondary | ICD-10-CM | POA: Diagnosis not present

## 2019-01-25 DIAGNOSIS — N952 Postmenopausal atrophic vaginitis: Secondary | ICD-10-CM | POA: Diagnosis not present

## 2019-01-25 DIAGNOSIS — G43909 Migraine, unspecified, not intractable, without status migrainosus: Secondary | ICD-10-CM | POA: Insufficient documentation

## 2019-01-25 HISTORY — DX: Migraine, unspecified, not intractable, without status migrainosus: G43.909

## 2019-01-27 DIAGNOSIS — L821 Other seborrheic keratosis: Secondary | ICD-10-CM | POA: Diagnosis not present

## 2019-01-27 DIAGNOSIS — Z85820 Personal history of malignant melanoma of skin: Secondary | ICD-10-CM | POA: Diagnosis not present

## 2019-04-27 DIAGNOSIS — M2550 Pain in unspecified joint: Secondary | ICD-10-CM | POA: Diagnosis not present

## 2019-07-22 DIAGNOSIS — M62830 Muscle spasm of back: Secondary | ICD-10-CM | POA: Diagnosis not present

## 2019-07-22 DIAGNOSIS — M9903 Segmental and somatic dysfunction of lumbar region: Secondary | ICD-10-CM | POA: Diagnosis not present

## 2019-07-22 DIAGNOSIS — M9902 Segmental and somatic dysfunction of thoracic region: Secondary | ICD-10-CM | POA: Diagnosis not present

## 2019-07-22 DIAGNOSIS — M9904 Segmental and somatic dysfunction of sacral region: Secondary | ICD-10-CM | POA: Diagnosis not present

## 2019-07-22 DIAGNOSIS — M9901 Segmental and somatic dysfunction of cervical region: Secondary | ICD-10-CM | POA: Diagnosis not present

## 2019-07-22 DIAGNOSIS — M542 Cervicalgia: Secondary | ICD-10-CM | POA: Diagnosis not present

## 2019-07-22 DIAGNOSIS — M50323 Other cervical disc degeneration at C6-C7 level: Secondary | ICD-10-CM | POA: Diagnosis not present

## 2019-07-22 DIAGNOSIS — G44201 Tension-type headache, unspecified, intractable: Secondary | ICD-10-CM | POA: Diagnosis not present

## 2019-07-26 DIAGNOSIS — M9903 Segmental and somatic dysfunction of lumbar region: Secondary | ICD-10-CM | POA: Diagnosis not present

## 2019-07-26 DIAGNOSIS — M9902 Segmental and somatic dysfunction of thoracic region: Secondary | ICD-10-CM | POA: Diagnosis not present

## 2019-07-26 DIAGNOSIS — M62830 Muscle spasm of back: Secondary | ICD-10-CM | POA: Diagnosis not present

## 2019-07-26 DIAGNOSIS — M9904 Segmental and somatic dysfunction of sacral region: Secondary | ICD-10-CM | POA: Diagnosis not present

## 2019-07-26 DIAGNOSIS — M9901 Segmental and somatic dysfunction of cervical region: Secondary | ICD-10-CM | POA: Diagnosis not present

## 2019-07-26 DIAGNOSIS — G44201 Tension-type headache, unspecified, intractable: Secondary | ICD-10-CM | POA: Diagnosis not present

## 2019-07-26 DIAGNOSIS — M542 Cervicalgia: Secondary | ICD-10-CM | POA: Diagnosis not present

## 2019-07-26 DIAGNOSIS — M50323 Other cervical disc degeneration at C6-C7 level: Secondary | ICD-10-CM | POA: Diagnosis not present

## 2019-07-28 DIAGNOSIS — L821 Other seborrheic keratosis: Secondary | ICD-10-CM | POA: Diagnosis not present

## 2019-07-28 DIAGNOSIS — Z85820 Personal history of malignant melanoma of skin: Secondary | ICD-10-CM | POA: Diagnosis not present

## 2019-07-28 DIAGNOSIS — L820 Inflamed seborrheic keratosis: Secondary | ICD-10-CM | POA: Diagnosis not present

## 2019-07-28 DIAGNOSIS — L578 Other skin changes due to chronic exposure to nonionizing radiation: Secondary | ICD-10-CM | POA: Diagnosis not present

## 2019-07-29 DIAGNOSIS — M62830 Muscle spasm of back: Secondary | ICD-10-CM | POA: Diagnosis not present

## 2019-07-29 DIAGNOSIS — M9904 Segmental and somatic dysfunction of sacral region: Secondary | ICD-10-CM | POA: Diagnosis not present

## 2019-07-29 DIAGNOSIS — M9902 Segmental and somatic dysfunction of thoracic region: Secondary | ICD-10-CM | POA: Diagnosis not present

## 2019-07-29 DIAGNOSIS — G44201 Tension-type headache, unspecified, intractable: Secondary | ICD-10-CM | POA: Diagnosis not present

## 2019-07-29 DIAGNOSIS — M9903 Segmental and somatic dysfunction of lumbar region: Secondary | ICD-10-CM | POA: Diagnosis not present

## 2019-07-29 DIAGNOSIS — M50323 Other cervical disc degeneration at C6-C7 level: Secondary | ICD-10-CM | POA: Diagnosis not present

## 2019-07-29 DIAGNOSIS — M542 Cervicalgia: Secondary | ICD-10-CM | POA: Diagnosis not present

## 2019-07-29 DIAGNOSIS — M9901 Segmental and somatic dysfunction of cervical region: Secondary | ICD-10-CM | POA: Diagnosis not present

## 2019-08-02 DIAGNOSIS — M9904 Segmental and somatic dysfunction of sacral region: Secondary | ICD-10-CM | POA: Diagnosis not present

## 2019-08-02 DIAGNOSIS — G44201 Tension-type headache, unspecified, intractable: Secondary | ICD-10-CM | POA: Diagnosis not present

## 2019-08-02 DIAGNOSIS — M50323 Other cervical disc degeneration at C6-C7 level: Secondary | ICD-10-CM | POA: Diagnosis not present

## 2019-08-02 DIAGNOSIS — M9903 Segmental and somatic dysfunction of lumbar region: Secondary | ICD-10-CM | POA: Diagnosis not present

## 2019-08-02 DIAGNOSIS — M62830 Muscle spasm of back: Secondary | ICD-10-CM | POA: Diagnosis not present

## 2019-08-02 DIAGNOSIS — M542 Cervicalgia: Secondary | ICD-10-CM | POA: Diagnosis not present

## 2019-08-02 DIAGNOSIS — M9901 Segmental and somatic dysfunction of cervical region: Secondary | ICD-10-CM | POA: Diagnosis not present

## 2019-08-02 DIAGNOSIS — M9902 Segmental and somatic dysfunction of thoracic region: Secondary | ICD-10-CM | POA: Diagnosis not present

## 2019-08-04 DIAGNOSIS — M9903 Segmental and somatic dysfunction of lumbar region: Secondary | ICD-10-CM | POA: Diagnosis not present

## 2019-08-04 DIAGNOSIS — M9902 Segmental and somatic dysfunction of thoracic region: Secondary | ICD-10-CM | POA: Diagnosis not present

## 2019-08-04 DIAGNOSIS — M9904 Segmental and somatic dysfunction of sacral region: Secondary | ICD-10-CM | POA: Diagnosis not present

## 2019-08-04 DIAGNOSIS — M62830 Muscle spasm of back: Secondary | ICD-10-CM | POA: Diagnosis not present

## 2019-08-04 DIAGNOSIS — M9901 Segmental and somatic dysfunction of cervical region: Secondary | ICD-10-CM | POA: Diagnosis not present

## 2019-08-04 DIAGNOSIS — M542 Cervicalgia: Secondary | ICD-10-CM | POA: Diagnosis not present

## 2019-08-04 DIAGNOSIS — M50323 Other cervical disc degeneration at C6-C7 level: Secondary | ICD-10-CM | POA: Diagnosis not present

## 2019-08-04 DIAGNOSIS — G44201 Tension-type headache, unspecified, intractable: Secondary | ICD-10-CM | POA: Diagnosis not present

## 2019-08-08 DIAGNOSIS — D3500 Benign neoplasm of unspecified adrenal gland: Secondary | ICD-10-CM | POA: Diagnosis not present

## 2019-08-08 DIAGNOSIS — K429 Umbilical hernia without obstruction or gangrene: Secondary | ICD-10-CM | POA: Diagnosis not present

## 2019-08-08 DIAGNOSIS — N289 Disorder of kidney and ureter, unspecified: Secondary | ICD-10-CM | POA: Diagnosis not present

## 2019-08-08 DIAGNOSIS — D3501 Benign neoplasm of right adrenal gland: Secondary | ICD-10-CM | POA: Diagnosis not present

## 2019-08-08 DIAGNOSIS — R109 Unspecified abdominal pain: Secondary | ICD-10-CM | POA: Diagnosis not present

## 2019-08-08 DIAGNOSIS — Z79899 Other long term (current) drug therapy: Secondary | ICD-10-CM | POA: Diagnosis not present

## 2019-08-08 DIAGNOSIS — R1900 Intra-abdominal and pelvic swelling, mass and lump, unspecified site: Secondary | ICD-10-CM | POA: Diagnosis not present

## 2019-08-08 DIAGNOSIS — Z791 Long term (current) use of non-steroidal anti-inflammatories (NSAID): Secondary | ICD-10-CM | POA: Diagnosis not present

## 2019-08-16 DIAGNOSIS — Z20828 Contact with and (suspected) exposure to other viral communicable diseases: Secondary | ICD-10-CM | POA: Diagnosis not present

## 2019-08-16 DIAGNOSIS — N2889 Other specified disorders of kidney and ureter: Secondary | ICD-10-CM | POA: Diagnosis not present

## 2019-08-16 DIAGNOSIS — J989 Respiratory disorder, unspecified: Secondary | ICD-10-CM | POA: Diagnosis not present

## 2019-08-23 DIAGNOSIS — M9904 Segmental and somatic dysfunction of sacral region: Secondary | ICD-10-CM | POA: Diagnosis not present

## 2019-08-23 DIAGNOSIS — M62830 Muscle spasm of back: Secondary | ICD-10-CM | POA: Diagnosis not present

## 2019-08-23 DIAGNOSIS — M50323 Other cervical disc degeneration at C6-C7 level: Secondary | ICD-10-CM | POA: Diagnosis not present

## 2019-08-23 DIAGNOSIS — M9902 Segmental and somatic dysfunction of thoracic region: Secondary | ICD-10-CM | POA: Diagnosis not present

## 2019-08-23 DIAGNOSIS — M9901 Segmental and somatic dysfunction of cervical region: Secondary | ICD-10-CM | POA: Diagnosis not present

## 2019-08-23 DIAGNOSIS — G44201 Tension-type headache, unspecified, intractable: Secondary | ICD-10-CM | POA: Diagnosis not present

## 2019-08-23 DIAGNOSIS — M542 Cervicalgia: Secondary | ICD-10-CM | POA: Diagnosis not present

## 2019-08-23 DIAGNOSIS — M9903 Segmental and somatic dysfunction of lumbar region: Secondary | ICD-10-CM | POA: Diagnosis not present

## 2019-08-25 DIAGNOSIS — M542 Cervicalgia: Secondary | ICD-10-CM | POA: Diagnosis not present

## 2019-08-25 DIAGNOSIS — M9901 Segmental and somatic dysfunction of cervical region: Secondary | ICD-10-CM | POA: Diagnosis not present

## 2019-08-25 DIAGNOSIS — M50323 Other cervical disc degeneration at C6-C7 level: Secondary | ICD-10-CM | POA: Diagnosis not present

## 2019-08-25 DIAGNOSIS — M9902 Segmental and somatic dysfunction of thoracic region: Secondary | ICD-10-CM | POA: Diagnosis not present

## 2019-08-25 DIAGNOSIS — M62830 Muscle spasm of back: Secondary | ICD-10-CM | POA: Diagnosis not present

## 2019-08-25 DIAGNOSIS — M9903 Segmental and somatic dysfunction of lumbar region: Secondary | ICD-10-CM | POA: Diagnosis not present

## 2019-08-25 DIAGNOSIS — M9904 Segmental and somatic dysfunction of sacral region: Secondary | ICD-10-CM | POA: Diagnosis not present

## 2019-08-25 DIAGNOSIS — G44201 Tension-type headache, unspecified, intractable: Secondary | ICD-10-CM | POA: Diagnosis not present

## 2019-09-07 DIAGNOSIS — M9901 Segmental and somatic dysfunction of cervical region: Secondary | ICD-10-CM | POA: Diagnosis not present

## 2019-09-07 DIAGNOSIS — M62830 Muscle spasm of back: Secondary | ICD-10-CM | POA: Diagnosis not present

## 2019-09-07 DIAGNOSIS — G44201 Tension-type headache, unspecified, intractable: Secondary | ICD-10-CM | POA: Diagnosis not present

## 2019-09-07 DIAGNOSIS — M9904 Segmental and somatic dysfunction of sacral region: Secondary | ICD-10-CM | POA: Diagnosis not present

## 2019-09-07 DIAGNOSIS — M542 Cervicalgia: Secondary | ICD-10-CM | POA: Diagnosis not present

## 2019-09-07 DIAGNOSIS — M9903 Segmental and somatic dysfunction of lumbar region: Secondary | ICD-10-CM | POA: Diagnosis not present

## 2019-09-07 DIAGNOSIS — M9902 Segmental and somatic dysfunction of thoracic region: Secondary | ICD-10-CM | POA: Diagnosis not present

## 2019-09-07 DIAGNOSIS — M50323 Other cervical disc degeneration at C6-C7 level: Secondary | ICD-10-CM | POA: Diagnosis not present

## 2019-09-27 DIAGNOSIS — D3501 Benign neoplasm of right adrenal gland: Secondary | ICD-10-CM | POA: Diagnosis not present

## 2019-09-27 DIAGNOSIS — K429 Umbilical hernia without obstruction or gangrene: Secondary | ICD-10-CM | POA: Diagnosis not present

## 2019-09-27 DIAGNOSIS — Z6829 Body mass index (BMI) 29.0-29.9, adult: Secondary | ICD-10-CM | POA: Diagnosis not present

## 2019-09-27 DIAGNOSIS — N3001 Acute cystitis with hematuria: Secondary | ICD-10-CM | POA: Diagnosis not present

## 2019-09-28 DIAGNOSIS — M50323 Other cervical disc degeneration at C6-C7 level: Secondary | ICD-10-CM | POA: Diagnosis not present

## 2019-09-28 DIAGNOSIS — M9902 Segmental and somatic dysfunction of thoracic region: Secondary | ICD-10-CM | POA: Diagnosis not present

## 2019-09-28 DIAGNOSIS — M9904 Segmental and somatic dysfunction of sacral region: Secondary | ICD-10-CM | POA: Diagnosis not present

## 2019-09-28 DIAGNOSIS — G44201 Tension-type headache, unspecified, intractable: Secondary | ICD-10-CM | POA: Diagnosis not present

## 2019-09-28 DIAGNOSIS — M62830 Muscle spasm of back: Secondary | ICD-10-CM | POA: Diagnosis not present

## 2019-09-28 DIAGNOSIS — M9903 Segmental and somatic dysfunction of lumbar region: Secondary | ICD-10-CM | POA: Diagnosis not present

## 2019-09-28 DIAGNOSIS — M9901 Segmental and somatic dysfunction of cervical region: Secondary | ICD-10-CM | POA: Diagnosis not present

## 2019-09-28 DIAGNOSIS — M542 Cervicalgia: Secondary | ICD-10-CM | POA: Diagnosis not present

## 2019-10-03 DIAGNOSIS — N289 Disorder of kidney and ureter, unspecified: Secondary | ICD-10-CM | POA: Diagnosis not present

## 2019-10-03 DIAGNOSIS — D3501 Benign neoplasm of right adrenal gland: Secondary | ICD-10-CM | POA: Diagnosis not present

## 2019-10-03 DIAGNOSIS — E278 Other specified disorders of adrenal gland: Secondary | ICD-10-CM | POA: Diagnosis not present

## 2019-10-03 DIAGNOSIS — N3001 Acute cystitis with hematuria: Secondary | ICD-10-CM | POA: Diagnosis not present

## 2019-10-11 DIAGNOSIS — D3501 Benign neoplasm of right adrenal gland: Secondary | ICD-10-CM | POA: Diagnosis not present

## 2019-10-11 DIAGNOSIS — Z6830 Body mass index (BMI) 30.0-30.9, adult: Secondary | ICD-10-CM | POA: Diagnosis not present

## 2019-10-11 DIAGNOSIS — I10 Essential (primary) hypertension: Secondary | ICD-10-CM | POA: Diagnosis not present

## 2019-10-12 DIAGNOSIS — I10 Essential (primary) hypertension: Secondary | ICD-10-CM | POA: Diagnosis not present

## 2019-10-12 DIAGNOSIS — D3501 Benign neoplasm of right adrenal gland: Secondary | ICD-10-CM | POA: Diagnosis not present

## 2019-10-12 DIAGNOSIS — R7303 Prediabetes: Secondary | ICD-10-CM | POA: Diagnosis not present

## 2019-10-12 DIAGNOSIS — E669 Obesity, unspecified: Secondary | ICD-10-CM | POA: Diagnosis not present

## 2019-10-12 DIAGNOSIS — E278 Other specified disorders of adrenal gland: Secondary | ICD-10-CM | POA: Diagnosis not present

## 2019-10-14 ENCOUNTER — Other Ambulatory Visit: Payer: Self-pay | Admitting: Internal Medicine

## 2019-10-14 DIAGNOSIS — E278 Other specified disorders of adrenal gland: Secondary | ICD-10-CM

## 2019-10-19 DIAGNOSIS — D3501 Benign neoplasm of right adrenal gland: Secondary | ICD-10-CM | POA: Diagnosis not present

## 2019-10-26 DIAGNOSIS — M50323 Other cervical disc degeneration at C6-C7 level: Secondary | ICD-10-CM | POA: Diagnosis not present

## 2019-10-26 DIAGNOSIS — M62830 Muscle spasm of back: Secondary | ICD-10-CM | POA: Diagnosis not present

## 2019-10-26 DIAGNOSIS — M9902 Segmental and somatic dysfunction of thoracic region: Secondary | ICD-10-CM | POA: Diagnosis not present

## 2019-10-26 DIAGNOSIS — G44201 Tension-type headache, unspecified, intractable: Secondary | ICD-10-CM | POA: Diagnosis not present

## 2019-10-26 DIAGNOSIS — D3501 Benign neoplasm of right adrenal gland: Secondary | ICD-10-CM | POA: Diagnosis not present

## 2019-10-26 DIAGNOSIS — M542 Cervicalgia: Secondary | ICD-10-CM | POA: Diagnosis not present

## 2019-10-26 DIAGNOSIS — M9904 Segmental and somatic dysfunction of sacral region: Secondary | ICD-10-CM | POA: Diagnosis not present

## 2019-10-26 DIAGNOSIS — M9901 Segmental and somatic dysfunction of cervical region: Secondary | ICD-10-CM | POA: Diagnosis not present

## 2019-10-26 DIAGNOSIS — M9903 Segmental and somatic dysfunction of lumbar region: Secondary | ICD-10-CM | POA: Diagnosis not present

## 2019-10-28 ENCOUNTER — Ambulatory Visit
Admission: RE | Admit: 2019-10-28 | Discharge: 2019-10-28 | Disposition: A | Discharge status: Home / Self Care | Payer: Medicare Other | Source: Ambulatory Visit | Attending: Internal Medicine | Admitting: Internal Medicine

## 2019-10-28 ENCOUNTER — Other Ambulatory Visit: Payer: Self-pay

## 2019-10-28 DIAGNOSIS — E278 Other specified disorders of adrenal gland: Secondary | ICD-10-CM

## 2019-10-28 DIAGNOSIS — K760 Fatty (change of) liver, not elsewhere classified: Secondary | ICD-10-CM | POA: Diagnosis not present

## 2019-10-28 IMAGING — CT CT ABDOMEN WO/W CM
1 of 4 series · 8 of 32 positions shown, 13 images · IV contrast (APPLIED)
Comparison: None
COMPARISON: None

Addendum:
CLINICAL DATA: Follow-up adrenal lesion discovered incidentally,
history of high blood pressure and melanoma

EXAM:
CT ABDOMEN WITHOUT AND WITH CONTRAST
TECHNIQUE: Multidetector CT imaging of the abdomen was performed following the
standard protocol before and following the bolus administration of
intravenous contrast.
CONTRAST:  100mL [U4] IOPAMIDOL ([U4]) INJECTION 61%
Creatinine was obtained on site at [HOSPITAL] at [HOSPITAL].
Results: Creatinine 1.0 mg/dL.

[Series 2: adrenal w/(date) · axial · 0.83mm/px · z∈[-276,-40]mm · 8 of 152 slices shown, 13 images]
[im 17/152  soft-tissue]
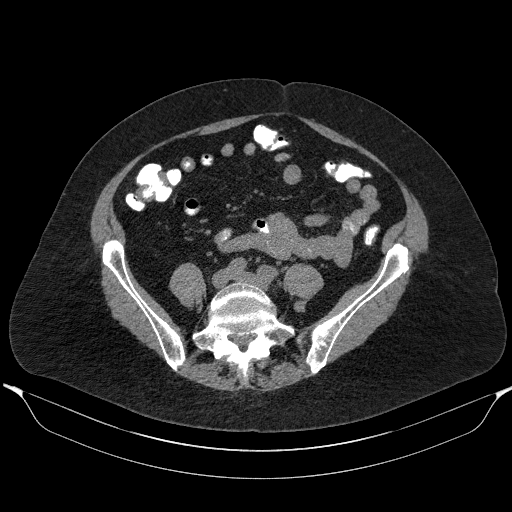
[im 17/152  bone]
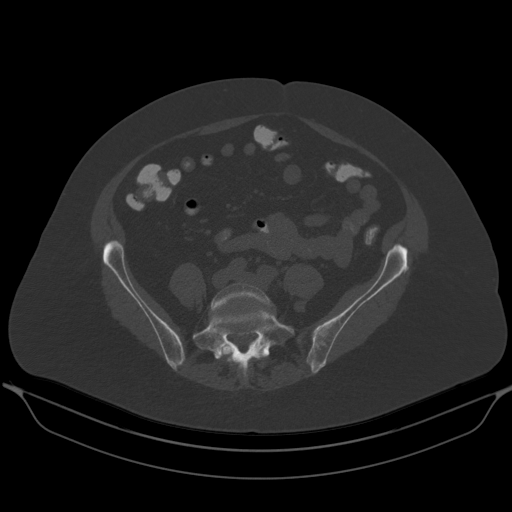
[im 34/152  soft-tissue]
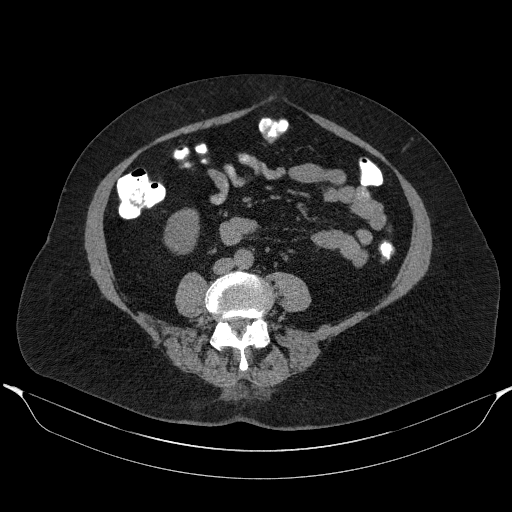
[im 51/152  soft-tissue]
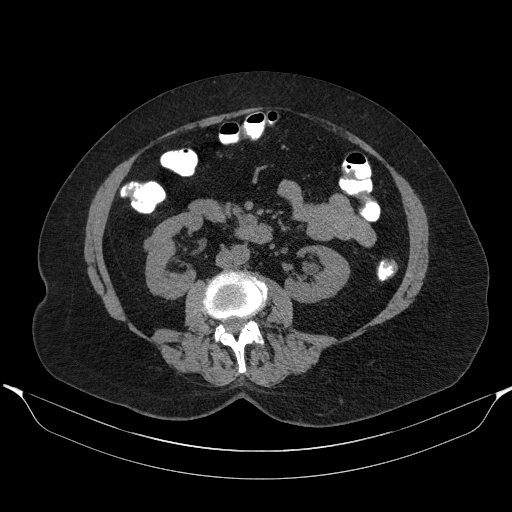
[im 68/152  soft-tissue]
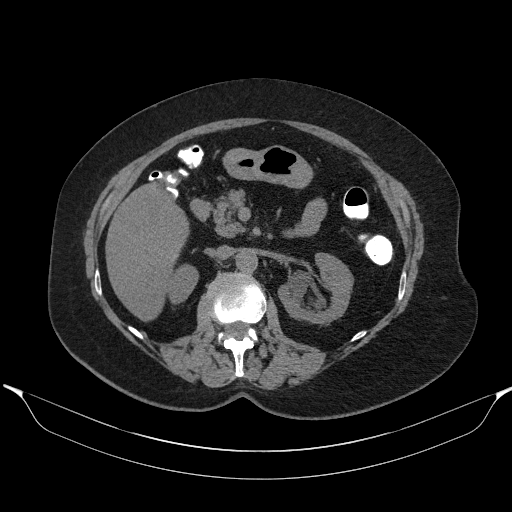
[im 84/152  soft-tissue]
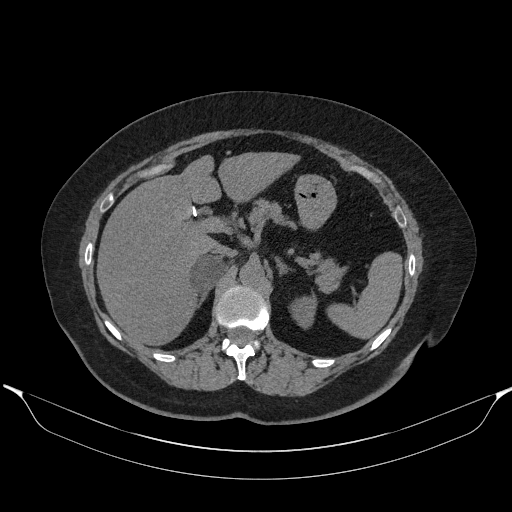
[im 84/152  lung]
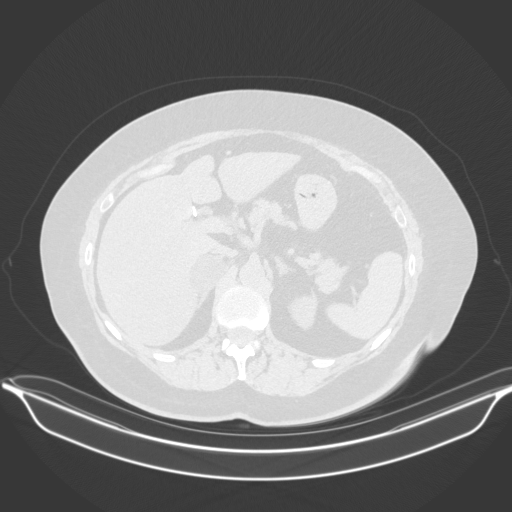
[im 101/152  soft-tissue]
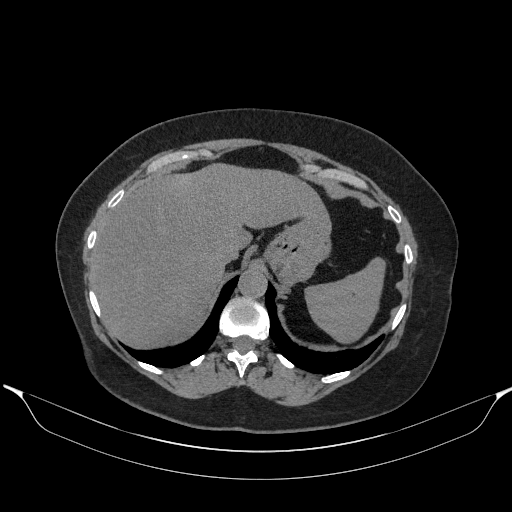
[im 101/152  lung]
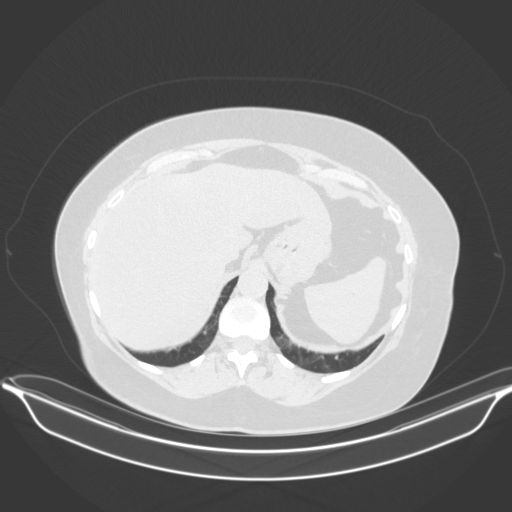
[im 118/152  soft-tissue]
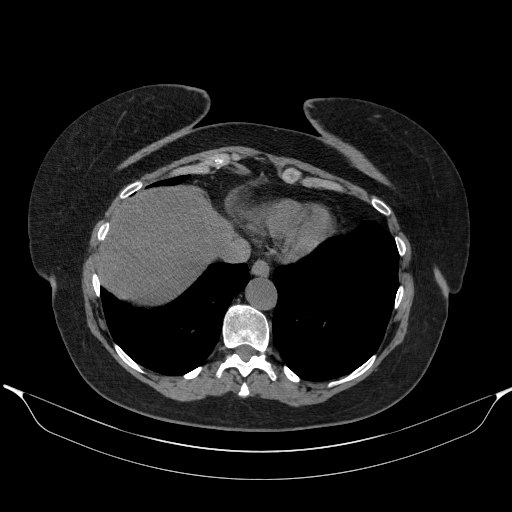
[im 118/152  lung]
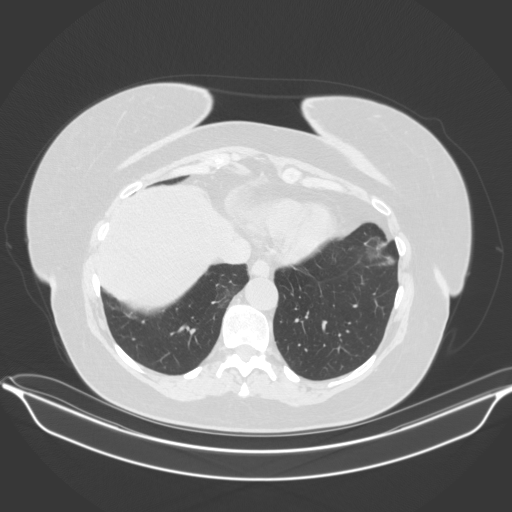
[im 135/152  soft-tissue]
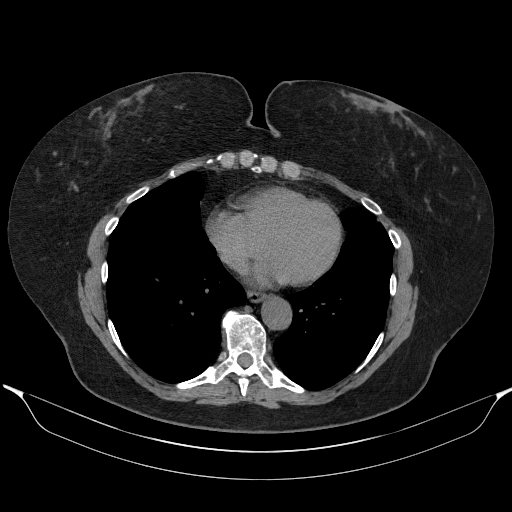
[im 135/152  lung]
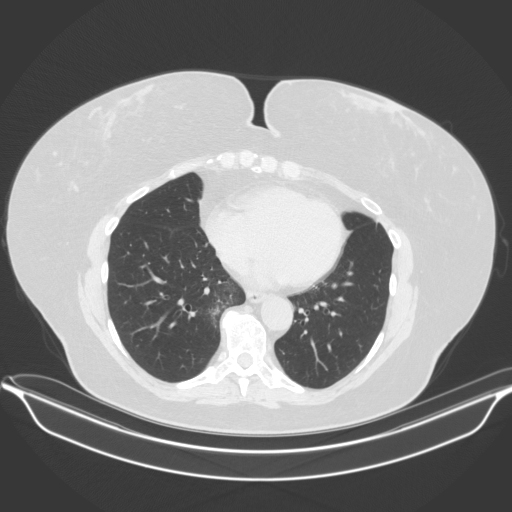

[8 of 32 positions shown; findings below may reference images not displayed]

FINDINGS: Lower chest: Lung bases are clear.

Hepatobiliary: Moderate to marked hepatic steatosis. Post
cholecystectomy. No focal, suspicious hepatic lesion. Portal vein is
patent.

Pancreas: 9 x 9 mm hyperdense focus in the pancreatic tail in close
proximity to splenic vasculature. No ductal dilation. No sign of
pancreatic inflammation.

Spleen: Spleen normal in size and contour.

Adrenals/Urinary Tract: LEFT adrenal is normal. In the RIGHT adrenal
gland there is a well-marginated homogeneous low-density lesion
measuring -2 Hounsfield units on pre contrast and approximately 60
Hounsfield units post-contrast. Precontrast density would meet
criteria for adrenal adenoma. This measures 3.6 x 2.7 cm, in the
coronal plane greatest greatest dimension approximately 4.1 cm.
This appears homogeneous on all phases with the exception of the
posterior margin on the most delayed phase. In this posterior margin
on image 68 of series 9 there is small peripheral nodularity
suggested measuring approximately 8 mm with density value on delay
of 45 Hounsfield units. This cannot be seen on other phases.

Renal enhancement is symmetric with small RIGHT renal cyst and LEFT
renal sinus cysts.

Stomach/Bowel: Stomach and small bowel, visualized portions of small
bowel are normal.

Vascular/Lymphatic: Vascular structures in the abdomen are patent.
There is no gastrohepatic or hepatoduodenal ligament
lymphadenopathy. No retroperitoneal or mesenteric lymphadenopathy.

Other: No ascites

Musculoskeletal: No acute musculoskeletal process. Spinal
degenerative changes.
IMPRESSION: 1. RIGHT adrenal lesion that shows precontrast density that would be
compatible with benign adrenal lesion measuring slightly greater
than 4 cm in greatest single dimension. Given borderline
characteristics in patient's history of neoplasm and hypertension
would suggest biochemical correlation to exclude pheochromocytoma.
Would also recommend comparison with imaging which prompted this
evaluation and any other prior imaging which may be obtainable to
assess for any changes. An addendum can be rendered when this is
available.
2. Very mild heterogeneity of the adrenal lesion which otherwise
displays washout characteristics that would also support the
diagnosis of adrenal adenoma but with very subtle peripheral nodular
areas that do not washout to the same extent, this makes this lesion
indeterminate. Particularly with large size and patient history.
3. 9 x 9 mm hyperdense focus in the pancreatic tail in close
proximity to splenic vasculature. This may represent a small
pancreatic lesion such as a small neuroendocrine tumor,
intrapancreatic splenule or small splenic artery aneurysm. MRI may
be helpful to differentiate between these options particularly the
potential for vascular lesion. Further evaluation of the adrenal
mass could be performed at that time.
4. Moderate to marked hepatic steatosis.
5. Post cholecystectomy.
6. RIGHT renal cyst and LEFT renal sinus cysts.
7. Aortic atherosclerosis.

Aortic Atherosclerosis ([U4]-[U4]).

ADDENDUM:
No additional imaging is available for comparison aside from a renal
sonogram. A mildly complex benign adrenal adenoma is favored in this
case given the baseline very low density that is seen on pre
contrast images, less than 10 Hounsfield units. As stated in the
initial evaluation biochemical correlation may be helpful given that
there is mild heterogeneity and history of hypertension. This
heterogeneity is best seen on image 68 of series 9 in the posterior
aspect of the RIGHT adrenal lesion, bilobed or 2 discrete nodular
areas measuring approximately 8 mm, is more pronounced on the
delayed phase and of uncertain significance. This could be further
assessed at MRI and may guide further management and or close
follow-up.

Pancreatic findings with further assessment as suggested with MRI,
adrenal lesion could be further assessed at this time as stated
above.

*** End of Addendum ***
FINDINGS: Lower chest: Lung bases are clear.

Hepatobiliary: Moderate to marked hepatic steatosis. Post
cholecystectomy. No focal, suspicious hepatic lesion. Portal vein is
patent.

Pancreas: 9 x 9 mm hyperdense focus in the pancreatic tail in close
proximity to splenic vasculature. No ductal dilation. No sign of
pancreatic inflammation.

Spleen: Spleen normal in size and contour.

Adrenals/Urinary Tract: LEFT adrenal is normal. In the RIGHT adrenal
gland there is a well-marginated homogeneous low-density lesion
measuring -2 Hounsfield units on pre contrast and approximately 60
Hounsfield units post-contrast. Precontrast density would meet
criteria for adrenal adenoma. This measures 3.6 x 2.7 cm, in the
coronal plane greatest greatest dimension approximately 4.1 cm.
This appears homogeneous on all phases with the exception of the
posterior margin on the most delayed phase. In this posterior margin
on image 68 of series 9 there is small peripheral nodularity
suggested measuring approximately 8 mm with density value on delay
of 45 Hounsfield units. This cannot be seen on other phases.

Renal enhancement is symmetric with small RIGHT renal cyst and LEFT
renal sinus cysts.

Stomach/Bowel: Stomach and small bowel, visualized portions of small
bowel are normal.

Vascular/Lymphatic: Vascular structures in the abdomen are patent.
There is no gastrohepatic or hepatoduodenal ligament
lymphadenopathy. No retroperitoneal or mesenteric lymphadenopathy.

Other: No ascites

Musculoskeletal: No acute musculoskeletal process. Spinal
degenerative changes.
IMPRESSION: 1. RIGHT adrenal lesion that shows precontrast density that would be
compatible with benign adrenal lesion measuring slightly greater
than 4 cm in greatest single dimension. Given borderline
characteristics in patient's history of neoplasm and hypertension
would suggest biochemical correlation to exclude pheochromocytoma.
Would also recommend comparison with imaging which prompted this
evaluation and any other prior imaging which may be obtainable to
assess for any changes. An addendum can be rendered when this is
available.
2. Very mild heterogeneity of the adrenal lesion which otherwise
displays washout characteristics that would also support the
diagnosis of adrenal adenoma but with very subtle peripheral nodular
areas that do not washout to the same extent, this makes this lesion
indeterminate. Particularly with large size and patient history.
3. 9 x 9 mm hyperdense focus in the pancreatic tail in close
proximity to splenic vasculature. This may represent a small
pancreatic lesion such as a small neuroendocrine tumor,
intrapancreatic splenule or small splenic artery aneurysm. MRI may
be helpful to differentiate between these options particularly the
potential for vascular lesion. Further evaluation of the adrenal
mass could be performed at that time.
4. Moderate to marked hepatic steatosis.
5. Post cholecystectomy.
6. RIGHT renal cyst and LEFT renal sinus cysts.
7. Aortic atherosclerosis.

Aortic Atherosclerosis ([U4]-[U4]).

## 2019-10-28 MED ORDER — IOPAMIDOL (ISOVUE-300) INJECTION 61%
100.0000 mL | Freq: Once | INTRAVENOUS | Status: AC | PRN
  Administered 2019-10-28: 100 mL via INTRAVENOUS

## 2019-11-02 DIAGNOSIS — R7303 Prediabetes: Secondary | ICD-10-CM | POA: Diagnosis not present

## 2019-11-02 DIAGNOSIS — K869 Disease of pancreas, unspecified: Secondary | ICD-10-CM | POA: Diagnosis not present

## 2019-11-02 DIAGNOSIS — E669 Obesity, unspecified: Secondary | ICD-10-CM | POA: Diagnosis not present

## 2019-11-02 DIAGNOSIS — E278 Other specified disorders of adrenal gland: Secondary | ICD-10-CM | POA: Diagnosis not present

## 2019-11-02 DIAGNOSIS — I10 Essential (primary) hypertension: Secondary | ICD-10-CM | POA: Diagnosis not present

## 2019-11-02 DIAGNOSIS — D3501 Benign neoplasm of right adrenal gland: Secondary | ICD-10-CM | POA: Diagnosis not present

## 2019-11-03 DIAGNOSIS — D3501 Benign neoplasm of right adrenal gland: Secondary | ICD-10-CM | POA: Diagnosis not present

## 2019-11-03 DIAGNOSIS — K869 Disease of pancreas, unspecified: Secondary | ICD-10-CM | POA: Diagnosis not present

## 2019-11-05 DIAGNOSIS — K219 Gastro-esophageal reflux disease without esophagitis: Secondary | ICD-10-CM | POA: Diagnosis not present

## 2019-11-05 DIAGNOSIS — I10 Essential (primary) hypertension: Secondary | ICD-10-CM | POA: Diagnosis not present

## 2019-11-23 ENCOUNTER — Other Ambulatory Visit: Payer: Self-pay | Admitting: Gastroenterology

## 2019-11-23 DIAGNOSIS — M542 Cervicalgia: Secondary | ICD-10-CM | POA: Diagnosis not present

## 2019-11-23 DIAGNOSIS — M9902 Segmental and somatic dysfunction of thoracic region: Secondary | ICD-10-CM | POA: Diagnosis not present

## 2019-11-23 DIAGNOSIS — R933 Abnormal findings on diagnostic imaging of other parts of digestive tract: Secondary | ICD-10-CM | POA: Diagnosis not present

## 2019-11-23 DIAGNOSIS — M62830 Muscle spasm of back: Secondary | ICD-10-CM | POA: Diagnosis not present

## 2019-11-23 DIAGNOSIS — I10 Essential (primary) hypertension: Secondary | ICD-10-CM | POA: Diagnosis not present

## 2019-11-23 DIAGNOSIS — M9901 Segmental and somatic dysfunction of cervical region: Secondary | ICD-10-CM | POA: Diagnosis not present

## 2019-11-23 DIAGNOSIS — G44201 Tension-type headache, unspecified, intractable: Secondary | ICD-10-CM | POA: Diagnosis not present

## 2019-11-23 DIAGNOSIS — K219 Gastro-esophageal reflux disease without esophagitis: Secondary | ICD-10-CM | POA: Diagnosis not present

## 2019-11-23 DIAGNOSIS — M9904 Segmental and somatic dysfunction of sacral region: Secondary | ICD-10-CM | POA: Diagnosis not present

## 2019-11-23 DIAGNOSIS — M9903 Segmental and somatic dysfunction of lumbar region: Secondary | ICD-10-CM | POA: Diagnosis not present

## 2019-11-23 DIAGNOSIS — M50323 Other cervical disc degeneration at C6-C7 level: Secondary | ICD-10-CM | POA: Diagnosis not present

## 2019-11-28 DIAGNOSIS — L089 Local infection of the skin and subcutaneous tissue, unspecified: Secondary | ICD-10-CM | POA: Diagnosis not present

## 2019-11-28 DIAGNOSIS — L219 Seborrheic dermatitis, unspecified: Secondary | ICD-10-CM | POA: Diagnosis not present

## 2019-11-28 DIAGNOSIS — L658 Other specified nonscarring hair loss: Secondary | ICD-10-CM | POA: Diagnosis not present

## 2019-11-28 DIAGNOSIS — L650 Telogen effluvium: Secondary | ICD-10-CM | POA: Diagnosis not present

## 2019-12-14 DIAGNOSIS — J329 Chronic sinusitis, unspecified: Secondary | ICD-10-CM | POA: Diagnosis not present

## 2019-12-14 DIAGNOSIS — Z6830 Body mass index (BMI) 30.0-30.9, adult: Secondary | ICD-10-CM | POA: Diagnosis not present

## 2019-12-14 DIAGNOSIS — J40 Bronchitis, not specified as acute or chronic: Secondary | ICD-10-CM | POA: Diagnosis not present

## 2019-12-19 DIAGNOSIS — I10 Essential (primary) hypertension: Secondary | ICD-10-CM | POA: Diagnosis not present

## 2019-12-19 DIAGNOSIS — Z79899 Other long term (current) drug therapy: Secondary | ICD-10-CM | POA: Diagnosis not present

## 2019-12-19 DIAGNOSIS — G43109 Migraine with aura, not intractable, without status migrainosus: Secondary | ICD-10-CM | POA: Diagnosis not present

## 2019-12-19 DIAGNOSIS — Z0000 Encounter for general adult medical examination without abnormal findings: Secondary | ICD-10-CM | POA: Diagnosis not present

## 2019-12-19 DIAGNOSIS — H6593 Unspecified nonsuppurative otitis media, bilateral: Secondary | ICD-10-CM | POA: Diagnosis not present

## 2019-12-19 DIAGNOSIS — K219 Gastro-esophageal reflux disease without esophagitis: Secondary | ICD-10-CM | POA: Diagnosis not present

## 2019-12-20 ENCOUNTER — Other Ambulatory Visit (HOSPITAL_COMMUNITY)
Admission: RE | Admit: 2019-12-20 | Discharge: 2019-12-20 | Disposition: A | Discharge status: Home / Self Care | Payer: Medicare Other | Source: Ambulatory Visit | Attending: Gastroenterology | Admitting: Gastroenterology

## 2019-12-20 DIAGNOSIS — Z01812 Encounter for preprocedural laboratory examination: Secondary | ICD-10-CM | POA: Diagnosis not present

## 2019-12-20 DIAGNOSIS — Z20822 Contact with and (suspected) exposure to covid-19: Secondary | ICD-10-CM | POA: Diagnosis not present

## 2019-12-20 LAB — SARS CORONAVIRUS 2 (TAT 6-24 HRS): SARS Coronavirus 2: NEGATIVE

## 2019-12-21 ENCOUNTER — Other Ambulatory Visit: Payer: Self-pay

## 2019-12-21 NOTE — Progress Notes (Signed)
Attempted to obtain medical history via telephone, unable to reach at this time. I left a voicemail to return pre surgical testing department's phone call.  

## 2019-12-23 ENCOUNTER — Ambulatory Visit (HOSPITAL_COMMUNITY): Payer: Medicare Other | Admitting: Anesthesiology

## 2019-12-23 ENCOUNTER — Encounter (HOSPITAL_COMMUNITY)
Admission: RE | Disposition: A | Discharge status: Home / Self Care | Payer: Self-pay | Source: Home / Self Care | Attending: Gastroenterology

## 2019-12-23 ENCOUNTER — Other Ambulatory Visit: Payer: Self-pay

## 2019-12-23 ENCOUNTER — Ambulatory Visit (HOSPITAL_COMMUNITY)
Admission: RE | Admit: 2019-12-23 | Discharge: 2019-12-23 | Disposition: A | Discharge status: Home / Self Care | Payer: Medicare Other | Attending: Gastroenterology | Admitting: Gastroenterology

## 2019-12-23 DIAGNOSIS — Z9049 Acquired absence of other specified parts of digestive tract: Secondary | ICD-10-CM | POA: Diagnosis not present

## 2019-12-23 DIAGNOSIS — Z881 Allergy status to other antibiotic agents status: Secondary | ICD-10-CM | POA: Diagnosis not present

## 2019-12-23 DIAGNOSIS — D490 Neoplasm of unspecified behavior of digestive system: Secondary | ICD-10-CM | POA: Diagnosis not present

## 2019-12-23 DIAGNOSIS — R935 Abnormal findings on diagnostic imaging of other abdominal regions, including retroperitoneum: Secondary | ICD-10-CM | POA: Diagnosis present

## 2019-12-23 DIAGNOSIS — K869 Disease of pancreas, unspecified: Secondary | ICD-10-CM | POA: Diagnosis not present

## 2019-12-23 DIAGNOSIS — R933 Abnormal findings on diagnostic imaging of other parts of digestive tract: Secondary | ICD-10-CM | POA: Diagnosis not present

## 2019-12-23 DIAGNOSIS — K862 Cyst of pancreas: Secondary | ICD-10-CM | POA: Diagnosis not present

## 2019-12-23 HISTORY — PX: UPPER ESOPHAGEAL ENDOSCOPIC ULTRASOUND (EUS): SHX6562

## 2019-12-23 HISTORY — PX: ESOPHAGOGASTRODUODENOSCOPY (EGD) WITH PROPOFOL: SHX5813

## 2019-12-23 SURGERY — UPPER ESOPHAGEAL ENDOSCOPIC ULTRASOUND (EUS)
Anesthesia: Monitor Anesthesia Care

## 2019-12-23 MED ORDER — PROPOFOL 500 MG/50ML IV EMUL
INTRAVENOUS | Status: DC | PRN
  Administered 2019-12-23: 150 ug/kg/min via INTRAVENOUS

## 2019-12-23 MED ORDER — ONDANSETRON HCL 4 MG/2ML IJ SOLN
INTRAMUSCULAR | Status: DC | PRN
  Administered 2019-12-23: 4 mg via INTRAVENOUS

## 2019-12-23 MED ORDER — GLYCOPYRROLATE PF 0.2 MG/ML IJ SOSY
PREFILLED_SYRINGE | INTRAMUSCULAR | Status: DC | PRN
  Administered 2019-12-23: .2 mg via INTRAVENOUS

## 2019-12-23 MED ORDER — SODIUM CHLORIDE 0.9 % IV SOLN: INTRAVENOUS | Status: DC

## 2019-12-23 MED ORDER — PROPOFOL 500 MG/50ML IV EMUL
INTRAVENOUS | Status: DC | PRN
  Administered 2019-12-23 (×10): 50 mg via INTRAVENOUS

## 2019-12-23 MED ORDER — LIDOCAINE 2% (20 MG/ML) 5 ML SYRINGE
INTRAMUSCULAR | Status: DC | PRN
  Administered 2019-12-23: 80 mg via INTRAVENOUS

## 2019-12-23 MED ORDER — DEXAMETHASONE SODIUM PHOSPHATE 10 MG/ML IJ SOLN
INTRAMUSCULAR | Status: DC | PRN
  Administered 2019-12-23: 5 mg via INTRAVENOUS

## 2019-12-23 MED ORDER — LACTATED RINGERS IV SOLN: INTRAVENOUS | Status: DC

## 2019-12-23 NOTE — Op Note (Signed)
[facility] Patient Name: Stacy Clayton Procedure Date: 12/23/2019 MRN: [id number] Attending MD: Carol Ada , MD Date of Birth: 07/04/1950 CSN: [id number] Age: 69 Admit Type: Outpatient Procedure:                Upper EUS Indications:              Abnormal abdominal/pelvic CT scan Providers:                Carol Ada, MD, Particia Nearing, RN, Cherylynn Ridges,                            Technician, Cletis Athens, Technician Referring MD:              Medicines:                Propofol per Anesthesia Complications:            No immediate complications. Estimated Blood Loss:     Estimated blood loss: none. Procedure:                Pre-Anesthesia Assessment:                           - Prior to the procedure, a History and Physical                            was performed, and patient medications and                            allergies were reviewed. The patient's tolerance of                            previous anesthesia was also reviewed. The risks                            and benefits of the procedure and the sedation                            options and risks were discussed with the patient.                            All questions were answered, and informed consent                            was obtained. Prior Anticoagulants: The patient has                            taken no previous anticoagulant or antiplatelet                            agents. ASA Grade Assessment: II - A patient with                            mild systemic disease. After reviewing the risks  and benefits, the patient was deemed in                            satisfactory condition to undergo the procedure.                           - Sedation was administered by an anesthesia                            professional. Deep sedation was attained.                           After obtaining informed consent, the endoscope was                            passed under  direct vision. Throughout the                            procedure, the patient's blood pressure, pulse, and                            oxygen saturations were monitored continuously. The                            ([id number]) [id number] Linear EUS was introduced                            through the mouth, and advanced to the second part                            of duodenum. The upper EUS was accomplished without                            difficulty. The patient tolerated the procedure                            well. Scope In: Scope Out: Findings:      ENDOSONOGRAPHIC FINDING: :      An anechoic lesion suggestive of a cyst was identified in the pancreatic       head. It communicates with the pancreatic duct. The lesion measured 11       mm by 6 mm in maximal cross-sectional diameter. There was a single       compartment without septae. The outer wall of the lesion was not seen.       There was no associated mass. There was no internal debris within the       fluid-filled cavity.      Evidence of a previous cholecystectomy was identified       endosonographically.      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 The lesion in the tail of the pancreas was not       confidently identified. There was a possible lesion, but it was not       clear if this was  a true lesion. Multiple passes in the splenic hilum       was performed. Impression:               - A cystic lesion was seen in the pancreatic head.                            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                           - Evidence of a cholecystectomy.                           - No specimens collected. Moderate Sedation:      Not Applicable - Patient had care per Anesthesia. Recommendation:           - Patient has a contact number available for                            emergencies. The  signs and symptoms of potential                            delayed complications were discussed with the                            patient. Return to normal activities tomorrow.                            Written discharge instructions were provided to the                            patient.                           - Resume regular diet.                           - Perform magnetic resonance imaging (MRI) with                            gadolinium. Procedure Code(s):        --- Professional ---                           [phone], Esophagogastroduodenoscopy, flexible,                            transoral; with endoscopic ultrasound examination                            limited to the esophagus, stomach or duodenum, and                            adjacent structures Diagnosis Code(s):        ---  Professional ---                           D49.0, Neoplasm of unspecified behavior of                            digestive system                           Z90.49, Acquired absence of other specified parts                            of digestive tract                           R93.5, Abnormal findings on diagnostic imaging of                            other abdominal regions, including retroperitoneum CPT copyright 2019 [employer]. All rights reserved. The codes documented in this report are preliminary and upon coder review may  be revised to meet current compliance requirements. Carol Ada, MD Carol Ada, MD 12/23/2019 10:57:12 AM This report has been signed electronically. Number of Addenda: 0

## 2019-12-23 NOTE — Anesthesia Postprocedure Evaluation (Signed)
Anesthesia Post Note  Patient: Stacy Clayton  Procedure(s) Performed: UPPER ESOPHAGEAL ENDOSCOPIC ULTRASOUND (EUS) (N/A )     Patient location during evaluation: Endoscopy Anesthesia Type: MAC Level of consciousness: awake and alert Pain management: pain level controlled Vital Signs Assessment: post-procedure vital signs reviewed and stable Respiratory status: spontaneous breathing and respiratory function stable Cardiovascular status: stable Postop Assessment: no apparent nausea or vomiting Anesthetic complications: no   No complications documented.  Last Vitals:  Vitals:   12/23/19 1130 12/23/19 1135  BP: 137/87   Pulse: 71 71  Resp: 16 18  Temp:    SpO2: 96% 97%    Last Pain:  Vitals:   12/23/19 1110  TempSrc:   PainSc: 0-No pain                 Juston Goheen DANIEL

## 2019-12-23 NOTE — Discharge Instructions (Signed)

## 2019-12-23 NOTE — Anesthesia Preprocedure Evaluation (Addendum)
Anesthesia Evaluation  Patient identified by MRN, date of birth, ID band Patient awake    Reviewed: Allergy & Precautions, NPO status , Patient's Chart, lab work & pertinent test results  History of Anesthesia Complications (+) PONV and history of anesthetic complications  Airway Mallampati: II  TM Distance: >3 FB Neck ROM: Full    Dental no notable dental hx. (+) Dental Advisory Given   Pulmonary neg pulmonary ROS,    Pulmonary exam normal        Cardiovascular negative cardio ROS Normal cardiovascular exam     Neuro/Psych negative neurological ROS     GI/Hepatic negative GI ROS, Neg liver ROS, Pancreatic mass   Endo/Other  negative endocrine ROS  Renal/GU negative Renal ROS     Musculoskeletal negative musculoskeletal ROS (+)   Abdominal   Peds  Hematology negative hematology ROS (+)   Anesthesia Other Findings   Reproductive/Obstetrics                            Anesthesia Physical Anesthesia Plan  ASA: II  Anesthesia Plan: MAC   Post-op Pain Management:    Induction:   PONV Risk Score and Plan: 3 and Ondansetron and Propofol infusion  Airway Management Planned: Simple Face Mask and Natural Airway  Additional Equipment:   Intra-op Plan:   Post-operative Plan:   Informed Consent: I have reviewed the patients History and Physical, chart, labs and discussed the procedure including the risks, benefits and alternatives for the proposed anesthesia with the patient or authorized representative who has indicated his/her understanding and acceptance.     Dental advisory given  Plan Discussed with: Anesthesiologist and CRNA  Anesthesia Plan Comments:        Anesthesia Quick Evaluation

## 2019-12-23 NOTE — H&P (Signed)
  Stacy Clayton HPI: A CT scan on 10/2019 for follow up of a right adrenal lesion showed a 9 mm hyperintense focus in the tail of the pancreas. There was suspicion that it was a neuroendocrine tumor. The CA19-9 was normal, but the chromogranin A was elevated.   Past Medical History:  Diagnosis Date  . Cancer ([place])   . PONV (postoperative nausea and vomiting)     Past Surgical History:  Procedure Laterality Date  . BREAST BIOPSY Left   . BREAST LUMPECTOMY WITH RADIOACTIVE SEED LOCALIZATION Left 09/30/2018   Procedure: LEFT BREAST LUMPECTOMY WITH RADIOACTIVE SEED LOCALIZATION;  Surgeon: Erroll Luna, MD;  Location: [facility];  Service: General;  Laterality: Left;  . CESAREAN SECTION    . CHOLECYSTECTOMY    . INCISION AND DRAINAGE  12/12/2011   Procedure: INCISION AND DRAINAGE;  Surgeon: Tennis Must, MD;  Location: [facility];  Service: Orthopedics;  Laterality: Left;  Incision and drainage of open left thumb distal phalanax fracture and nail bed injury  . NAILBED REPAIR  12/12/2011   Procedure: NAILBED REPAIR;  Surgeon: Tennis Must, MD;  Location: [facility];  Service: Orthopedics;  Laterality: Left;  . PERCUTANEOUS PINNING  12/12/2011   Procedure: PERCUTANEOUS PINNING EXTREMITY;  Surgeon: Tennis Must, MD;  Location: [facility];  Service: Orthopedics;  Laterality: Left;  Pinning of distal phalanax fracture of left thumb  . SKIN CANCER EXCISION    . TONSILLECTOMY      Family History  Problem Relation Age of Onset  . Hypertension Mother   . Diabetes Mother   . Cancer Mother   . Breast cancer Maternal Aunt   . Breast cancer Cousin     Social History:  reports that she has never smoked. She has never used smokeless tobacco. She reports that she does not drink alcohol. No history on file for drug use.  Allergies:  Allergies  Allergen Reactions  . Tetracycline Nausea Only    Medications:  Scheduled:  Continuous: . sodium chloride      No results found for this or any  previous visit (from the past 24 hour(s)).   No results found.  ROS:  As stated above in the HPI otherwise negative.  Height 5\' 6"  (1.676 m), weight 83.9 kg.    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  [name] D 12/23/2019, 9:38 AM

## 2019-12-23 NOTE — Transfer of Care (Signed)
Immediate Anesthesia Transfer of Care Note  Patient: Stacy Clayton  Procedure(s) Performed: UPPER ESOPHAGEAL ENDOSCOPIC ULTRASOUND (EUS) (N/A )  Patient Location: PACU and Endoscopy Unit  Anesthesia Type:MAC  Level of Consciousness: awake, alert  and oriented  Airway & Oxygen Therapy: Patient Spontanous Breathing and Patient connected to face mask  Post-op Assessment: Report given to RN and Post -op Vital signs reviewed and stable  Post vital signs: Reviewed and stable  Last Vitals:  Vitals Value Taken Time  BP 137/57 12/23/19 1044  Temp    Pulse 63 12/23/19 1045  Resp 10 12/23/19 1045  SpO2 99 % 12/23/19 1045  Vitals shown include unvalidated device data.  Last Pain:  Vitals:   12/23/19 0938  TempSrc: Oral  PainSc: 0-No pain         Complications: No complications documented.

## 2019-12-25 ENCOUNTER — Encounter (HOSPITAL_COMMUNITY): Payer: Self-pay | Admitting: Gastroenterology

## 2019-12-26 ENCOUNTER — Other Ambulatory Visit: Payer: Self-pay | Admitting: Gastroenterology

## 2019-12-26 ENCOUNTER — Other Ambulatory Visit (HOSPITAL_COMMUNITY): Payer: Self-pay | Admitting: Gastroenterology

## 2019-12-26 DIAGNOSIS — K862 Cyst of pancreas: Secondary | ICD-10-CM

## 2020-01-03 ENCOUNTER — Ambulatory Visit (HOSPITAL_COMMUNITY)
Admission: RE | Admit: 2020-01-03 | Discharge: 2020-01-03 | Disposition: A | Discharge status: Home / Self Care | Payer: Medicare Other | Source: Ambulatory Visit | Attending: Gastroenterology | Admitting: Gastroenterology

## 2020-01-03 ENCOUNTER — Other Ambulatory Visit: Payer: Self-pay

## 2020-01-03 DIAGNOSIS — I728 Aneurysm of other specified arteries: Secondary | ICD-10-CM | POA: Diagnosis not present

## 2020-01-03 DIAGNOSIS — K862 Cyst of pancreas: Secondary | ICD-10-CM | POA: Insufficient documentation

## 2020-01-03 DIAGNOSIS — N281 Cyst of kidney, acquired: Secondary | ICD-10-CM | POA: Diagnosis not present

## 2020-01-03 DIAGNOSIS — E279 Disorder of adrenal gland, unspecified: Secondary | ICD-10-CM | POA: Diagnosis not present

## 2020-01-03 IMAGING — MR MR ABDOMEN WO/W CM
15 of 19 series · 35 of 48 positions shown · IV contrast (gadavist)
Comparison: CT evaluation from [DATE]

CLINICAL DATA: Adrenal mass.  Abnormal finding in the pancreas.

EXAM:
MRI ABDOMEN WITHOUT AND WITH CONTRAST
TECHNIQUE: Multiplanar multisequence MR imaging of the abdomen was performed
both before and after the administration of intravenous contrast.
CONTRAST:  8mL GADAVIST GADOBUTROL 1 MMOL/ML IV SOLN

[Series 3: DWI · axial · 6.0mm · 1.49mm/px · z∈[-80,+172]mm · 4 of 72 slices shown (1 of 2)]
[im 1/72]
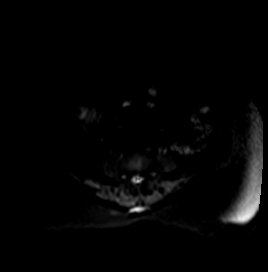
[im 24/72]
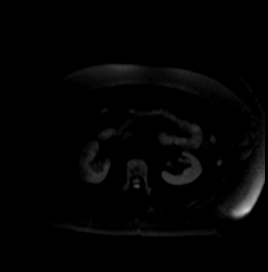
[im 48/72]
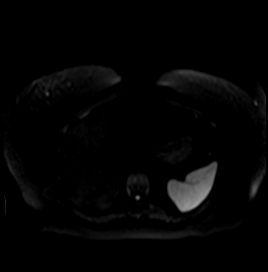
[im 72/72]
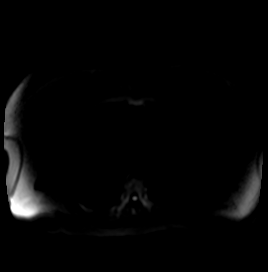

[Series 4: DWI · axial · 6.0mm · 1.49mm/px · 1 of 36 slices shown (2 of 2)]
[im 1/36]
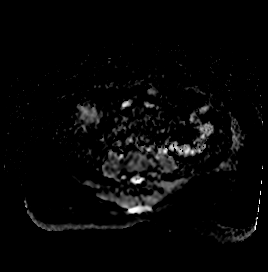

[Series 5: T2 fat-sat · axial · 6.0mm · 1.25mm/px · 1 of 36 slices shown]
[im 1/36]
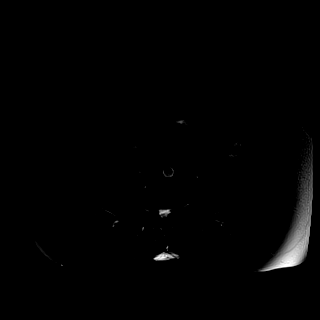

[Series 8: cor_3d_spc_trig · coronal · 1.0mm · 0.49mm/px · 3 of 69 slices shown]
[im 1/69]
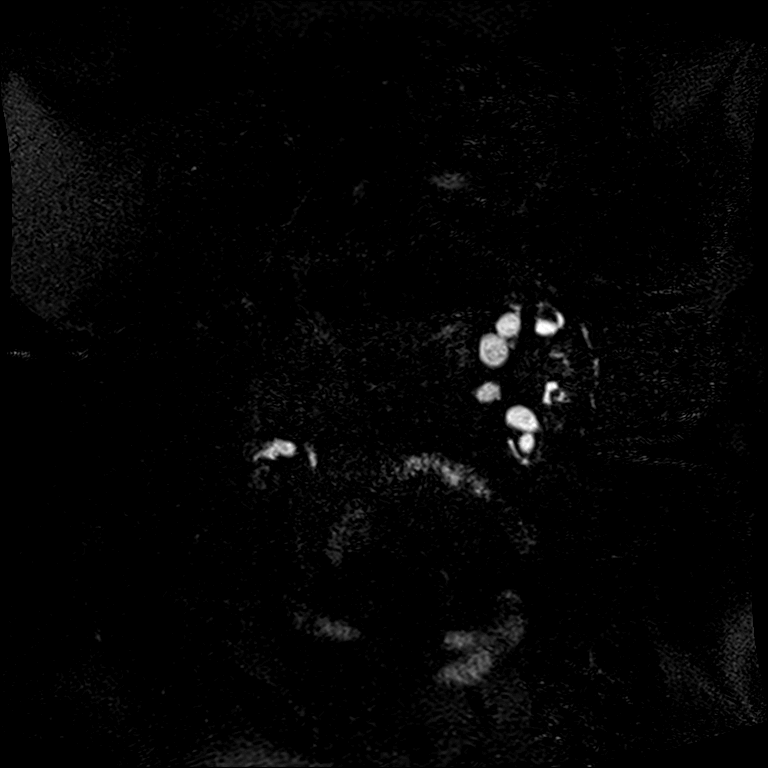
[im 35/69]
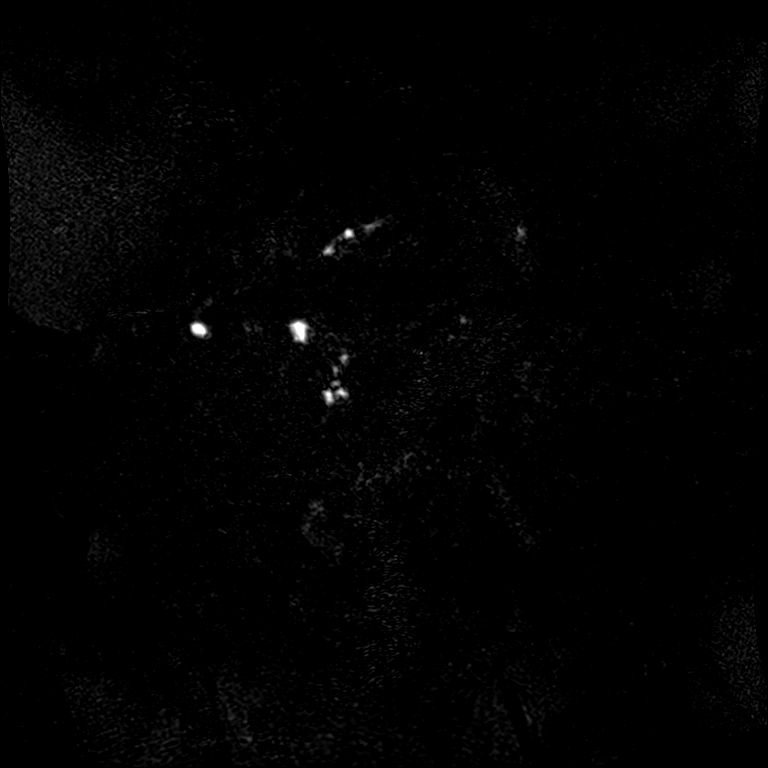
[im 69/69]
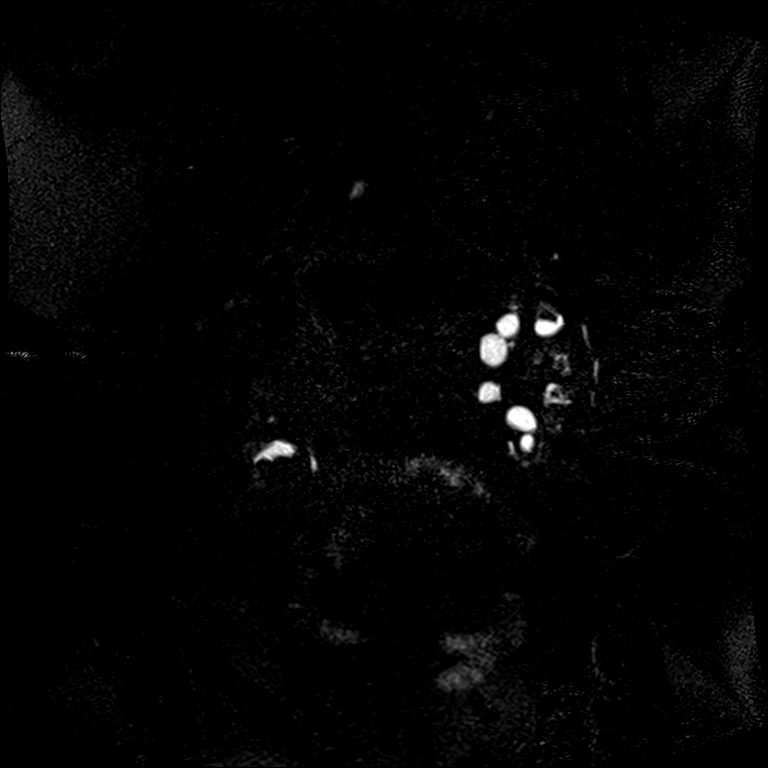

[Series 10: T2 · coronal · 7.0mm · 1.56mm/px · 1 of 23 slices shown (1 of 2)]
[im 1/23]
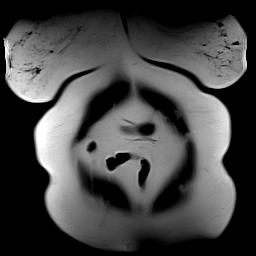

[Series 11: T1 · axial · 3.1mm · 1.25mm/px · z∈[-67,+178]mm · 3 of 80 slices shown (1 of 2)]
[im 1/80]
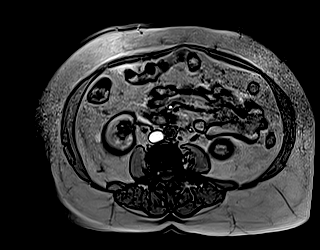
[im 40/80]
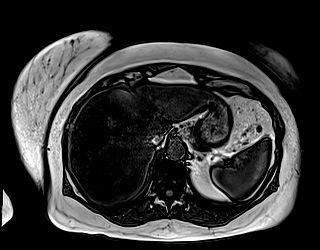
[im 80/80]
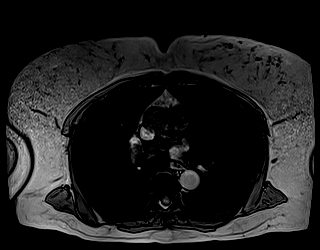

[Series 12: T1 · axial · 3.1mm · 1.25mm/px · z∈[-67,+178]mm · 3 of 80 slices shown (2 of 2)]
[im 1/80]
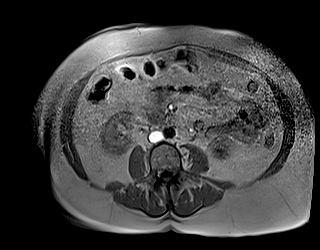
[im 40/80]
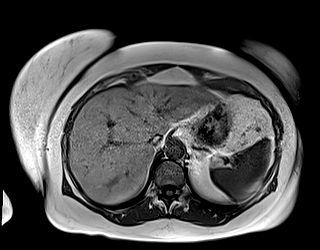
[im 80/80]
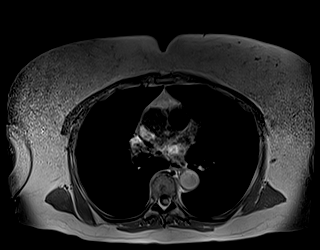

[Series 13: bSSFP · axial · 7.0mm · 1.25mm/px · 1 of 30 slices shown]
[im 1/30]
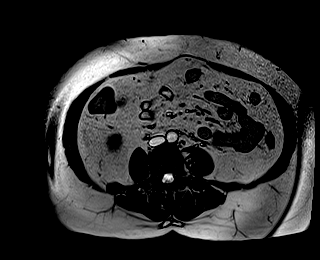

[Series 15: T1 dynamic · axial · 3.0mm · 1.25mm/px · z∈[-59,+154]mm · 3 of 72 slices shown (1 of 6)]
[im 1/72]
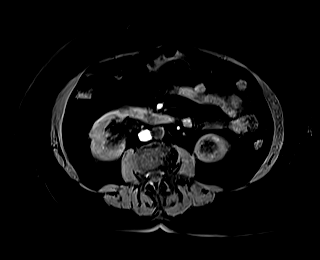
[im 36/72]
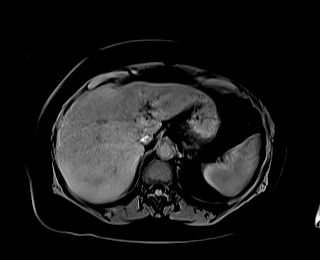
[im 72/72]
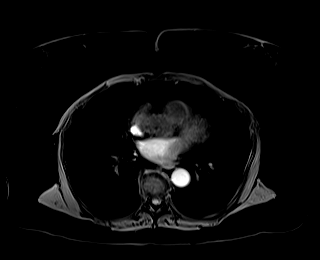

[Series 18: T1 dynamic · axial · 3.0mm · 1.25mm/px · z∈[-59,+154]mm · 3 of 72 slices shown (2 of 6)]
[im 1/72]
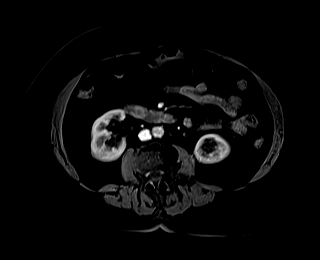
[im 36/72]
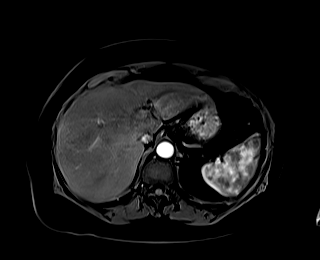
[im 72/72]
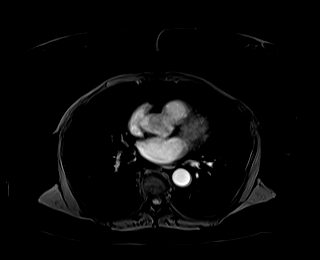

[Series 20: T1 dynamic · axial · 3.0mm · 1.25mm/px · z∈[-59,+154]mm · 3 of 72 slices shown (3 of 6)]
[im 1/72]
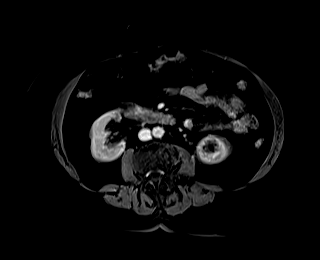
[im 36/72]
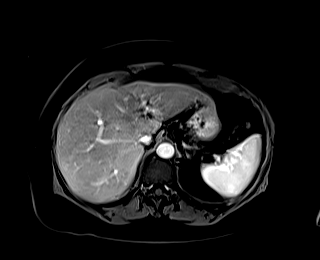
[im 72/72]
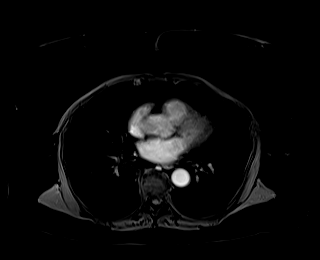

[Series 22: T1 dynamic · axial · 3.0mm · 1.25mm/px · z∈[-59,+154]mm · 3 of 72 slices shown (4 of 6)]
[im 1/72]
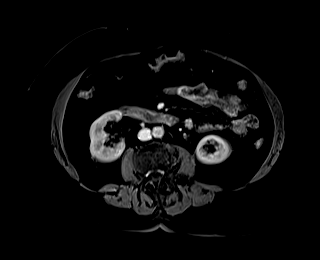
[im 36/72]
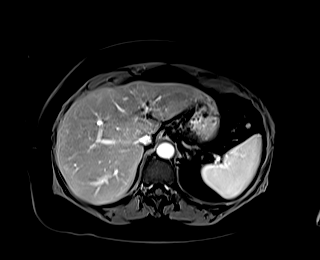
[im 72/72]
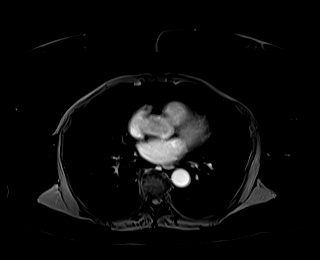

[Series 24: T1 dynamic · coronal · 3.0mm · 1.41mm/px · 3 of 72 slices shown (5 of 6)]
[im 1/72]
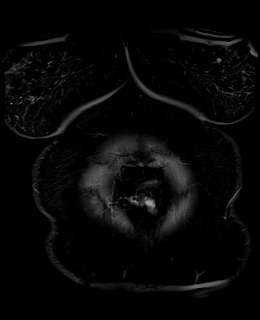
[im 36/72]
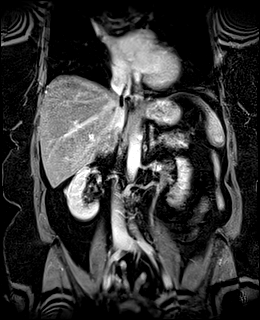
[im 72/72]
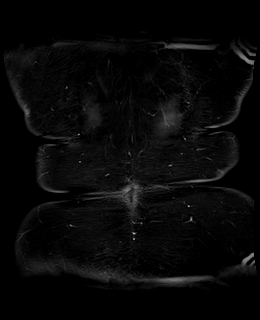

[Series 25: T2 · axial · 6.0mm · 1.56mm/px · 1 of 30 slices shown (2 of 2)]
[im 1/30]
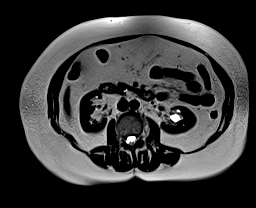

[Series 27: T1 dynamic · axial · 3.0mm · 1.25mm/px · z∈[-59,+46]mm · 2 of 72 slices shown (6 of 6)]
[im 1/72]
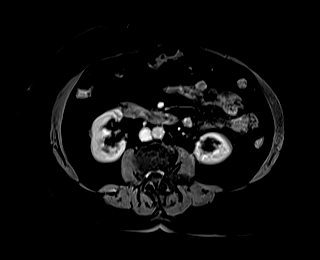
[im 36/72]
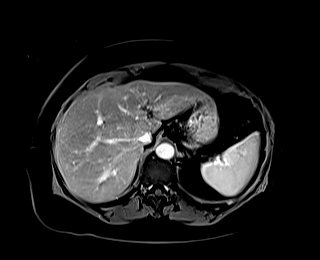

[35 of 48 positions shown; findings below may reference images not displayed]

FINDINGS: Lower chest: Incidental imaging of the lung bases is unremarkable by
MRI.

Hepatobiliary: Marked hepatic steatosis. No focal, suspicious
hepatic lesion. Areas of fatty sparing about the gallbladder fossa.
Post cholecystectomy without signs of biliary duct dilation or
filling defect in the biliary tree.

Pancreas: Pancreas with normal T1 signal. No signs of ductal
dilation.

Small cystic foci in the pancreas, largest in the pancreatic head
measuring approximately 8-9 mm (image 28, series 25) another in the
neck of the pancreas adjacent to this measuring 3-4 mm on image 28,
another in the proximal body on image 23 of 25 measuring 5 mm. Two
additional 3-4 mm cystic foci in the tail of the pancreas on image
20 and 21 of series 25.

On arterial phase the area of concern in the tail of pancreas
represents a small splenic artery aneurysm, approximately 8 mm
greatest axial dimension extending into the pancreatic parenchyma.
No enhancing parenchymal lesion.

Spleen:  Spleen normal size.  No focal lesion.

Adrenals/Urinary Tract: RIGHT adrenal mass displays nearly uniform
signal loss on out of phase as compared to in phase phase T1
weighted gradient echo imaging. Enhancement with minimal
heterogeneity. Maximum size on axial images approximately 3.6 cm,
greatest dimension on any plain just at 4 cm as described on the
previous CT.

LEFT adrenal is normal.

Small hemorrhagic cyst on the RIGHT with renal sinus cysts on the
LEFT. No hydronephrosis, otherwise with symmetric renal enhancement.

Stomach/Bowel: No acute gastrointestinal process. Limited assessment
on MRI

Vascular/Lymphatic: Vascular structures in the abdomen are patent
including the portal vein. Small splenic artery aneurysm as
described. There is no gastrohepatic or hepatoduodenal ligament
lymphadenopathy. No retroperitoneal or mesenteric lymphadenopathy.

Other:  No ascites

Musculoskeletal: No suspicious bone lesions identified.
IMPRESSION: 1. RIGHT adrenal mass displays nearly uniform signal loss on out of
phase T1 weighted gradient echo imaging. Enhancement with minimal
heterogeneity. Findings are consistent with a benign adrenal adenoma
though size remains mildly concerning based on current guidelines.
Biochemical correlation if not yet performed would be helpful.
Consider six-month follow-up to ensure stability, surgical
consultation could also be considered based on size.
2. Small splenic artery aneurysm in the tail the pancreas
corresponding to the abnormality seen in this area on the CT,
attention on follow-up.
3. Small cystic foci in the pancreas, largest in the pancreatic head
measuring 8-9 mm greatest axial dimension. Likely small side branch
intraductal papillary mucinous neoplasm. Would follow this lesion
based on findings from EUS. Given that endoscopic ultrasound
mentions "main duct" intraductal papillary mucinous neoplasm,
follow-up imaging could be performed at a more frequent interval. If
side-branch lesion is favored follow-up interval would be 2 years
based on patient age and current guidelines. Could consider
shortening this interval based on recent endoscopic findings as
warranted.
4. Small hemorrhagic cyst of the RIGHT kidney and renal sinus cysts
on the LEFT.
5. Marked hepatic steatosis with areas of fatty sparing about the
gallbladder fossa.

## 2020-01-03 MED ORDER — GADOBUTROL 1 MMOL/ML IV SOLN
8.0000 mL | Freq: Once | INTRAVENOUS | Status: AC | PRN
  Administered 2020-01-03: 16:00:00 8 mL via INTRAVENOUS

## 2020-01-04 ENCOUNTER — Ambulatory Visit (HOSPITAL_COMMUNITY): Payer: Medicare Other

## 2020-01-04 DIAGNOSIS — D3501 Benign neoplasm of right adrenal gland: Secondary | ICD-10-CM | POA: Diagnosis not present

## 2020-01-04 DIAGNOSIS — H6593 Unspecified nonsuppurative otitis media, bilateral: Secondary | ICD-10-CM | POA: Diagnosis not present

## 2020-01-04 DIAGNOSIS — R3121 Asymptomatic microscopic hematuria: Secondary | ICD-10-CM | POA: Diagnosis not present

## 2020-01-04 DIAGNOSIS — N3000 Acute cystitis without hematuria: Secondary | ICD-10-CM | POA: Diagnosis not present

## 2020-01-04 DIAGNOSIS — Z6829 Body mass index (BMI) 29.0-29.9, adult: Secondary | ICD-10-CM | POA: Diagnosis not present

## 2020-02-29 DIAGNOSIS — I10 Essential (primary) hypertension: Secondary | ICD-10-CM | POA: Insufficient documentation

## 2020-02-29 HISTORY — DX: Essential (primary) hypertension: I10

## 2020-03-27 DIAGNOSIS — Z1231 Encounter for screening mammogram for malignant neoplasm of breast: Secondary | ICD-10-CM | POA: Diagnosis not present

## 2020-03-27 DIAGNOSIS — N958 Other specified menopausal and perimenopausal disorders: Secondary | ICD-10-CM | POA: Diagnosis not present

## 2020-03-27 DIAGNOSIS — N952 Postmenopausal atrophic vaginitis: Secondary | ICD-10-CM | POA: Diagnosis not present

## 2020-03-27 DIAGNOSIS — Z124 Encounter for screening for malignant neoplasm of cervix: Secondary | ICD-10-CM | POA: Diagnosis not present

## 2020-03-27 DIAGNOSIS — M8588 Other specified disorders of bone density and structure, other site: Secondary | ICD-10-CM | POA: Diagnosis not present

## 2020-03-27 DIAGNOSIS — Z6831 Body mass index (BMI) 31.0-31.9, adult: Secondary | ICD-10-CM | POA: Diagnosis not present

## 2020-04-16 DIAGNOSIS — Z79899 Other long term (current) drug therapy: Secondary | ICD-10-CM | POA: Diagnosis not present

## 2020-04-16 DIAGNOSIS — L219 Seborrheic dermatitis, unspecified: Secondary | ICD-10-CM | POA: Diagnosis not present

## 2020-04-16 DIAGNOSIS — L650 Telogen effluvium: Secondary | ICD-10-CM | POA: Diagnosis not present

## 2020-04-16 DIAGNOSIS — L658 Other specified nonscarring hair loss: Secondary | ICD-10-CM | POA: Diagnosis not present

## 2020-05-08 DIAGNOSIS — I10 Essential (primary) hypertension: Secondary | ICD-10-CM | POA: Diagnosis not present

## 2020-05-08 DIAGNOSIS — R7303 Prediabetes: Secondary | ICD-10-CM | POA: Diagnosis not present

## 2020-05-08 DIAGNOSIS — Z6830 Body mass index (BMI) 30.0-30.9, adult: Secondary | ICD-10-CM | POA: Diagnosis not present

## 2020-05-08 DIAGNOSIS — Z1211 Encounter for screening for malignant neoplasm of colon: Secondary | ICD-10-CM | POA: Diagnosis not present

## 2020-05-28 ENCOUNTER — Other Ambulatory Visit: Payer: Self-pay | Admitting: Urology

## 2020-05-28 DIAGNOSIS — D3501 Benign neoplasm of right adrenal gland: Secondary | ICD-10-CM

## 2020-06-20 DIAGNOSIS — K5730 Diverticulosis of large intestine without perforation or abscess without bleeding: Secondary | ICD-10-CM | POA: Diagnosis not present

## 2020-06-20 DIAGNOSIS — K648 Other hemorrhoids: Secondary | ICD-10-CM | POA: Diagnosis not present

## 2020-06-22 ENCOUNTER — Other Ambulatory Visit: Payer: Self-pay | Admitting: Gastroenterology

## 2020-06-22 DIAGNOSIS — Z961 Presence of intraocular lens: Secondary | ICD-10-CM | POA: Diagnosis not present

## 2020-06-26 ENCOUNTER — Other Ambulatory Visit: Payer: Self-pay

## 2020-06-26 ENCOUNTER — Ambulatory Visit
Admission: RE | Admit: 2020-06-26 | Discharge: 2020-06-26 | Disposition: A | Discharge status: Home / Self Care | Payer: Medicare PPO | Source: Ambulatory Visit | Attending: Urology | Admitting: Urology

## 2020-06-26 DIAGNOSIS — D3501 Benign neoplasm of right adrenal gland: Secondary | ICD-10-CM

## 2020-06-26 DIAGNOSIS — E279 Disorder of adrenal gland, unspecified: Secondary | ICD-10-CM | POA: Diagnosis not present

## 2020-06-26 DIAGNOSIS — K760 Fatty (change of) liver, not elsewhere classified: Secondary | ICD-10-CM | POA: Diagnosis not present

## 2020-06-26 IMAGING — MR MR ABDOMEN WO/W CM
11 of 17 series · 24 of 48 positions shown · IV contrast (17ml Multihance)
Comparison: CT [DATE] and MRI [DATE]

CLINICAL DATA: Follow-up adrenal mass, prior history of melanoma

EXAM:
MRI ABDOMEN WITHOUT AND WITH CONTRAST
TECHNIQUE: Multiplanar multisequence MR imaging of the abdomen was performed
both before and after the administration of intravenous contrast.
CONTRAST:  17mL MULTIHANCE GADOBENATE DIMEGLUMINE 529 MG/ML IV SOLN

[Series 3: T2 · coronal · 5.0mm · 1.56mm/px · 1 of 30 slices shown (1 of 3)]
[im 1/30]
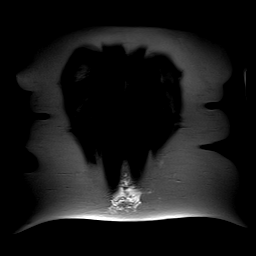

[Series 5: ep2d_diff_b50_500_800_p2 · axial · 6.0mm · 1.98mm/px · z∈[-124,+141]mm · 3 of 105 slices shown]
[im 1/105]
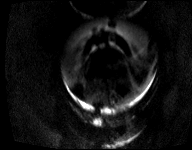
[im 53/105]
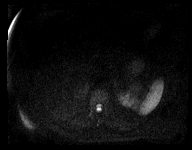
[im 105/105]
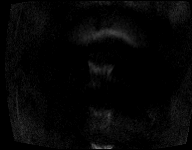

[Series 6: ep2d_diff_b50_500_800_p2_adc · axial · 6.0mm · 1.98mm/px · 1 of 35 slices shown]
[im 1/35]
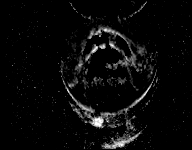

[Series 7: T2 · axial · 6.5mm · 0.74mm/px · 1 of 36 slices shown (2 of 3)]
[im 1/36]
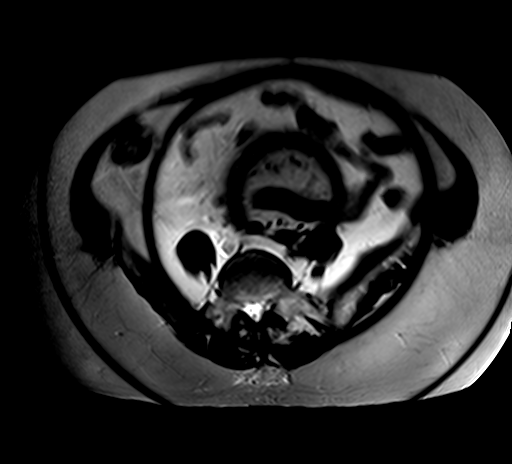

[Series 8: axial in out · axial · 5.5mm · 0.74mm/px · 1 of 70 slices shown]
[im 1/70]
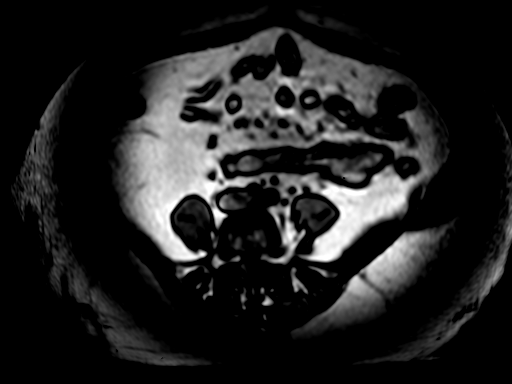

[Series 9: cor in out · coronal · 5.5mm · 0.74mm/px · 2 of 52 slices shown]
[im 1/52]
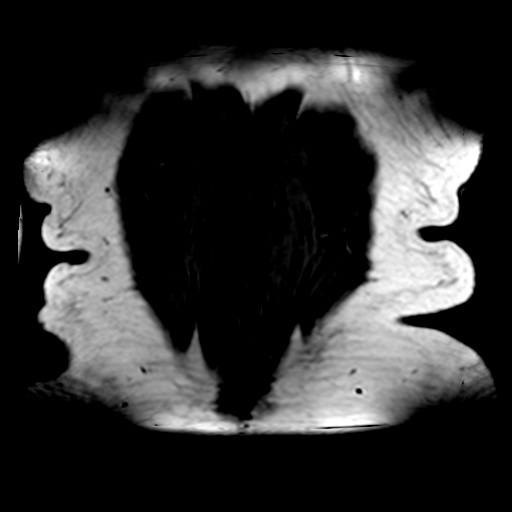
[im 52/52]
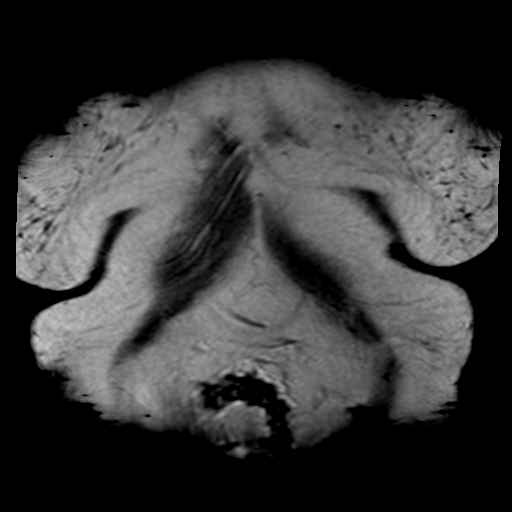

[Series 10: T1 dynamic · axial · non-contrast · 2.2mm · 0.78mm/px · z∈[-121,+106]mm · 4 of 104 slices shown]
[im 1/104]
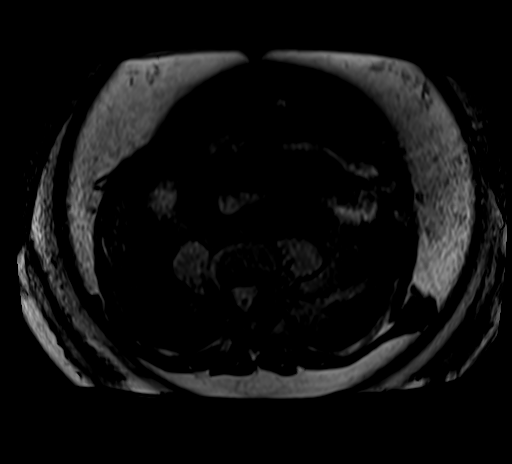
[im 35/104]
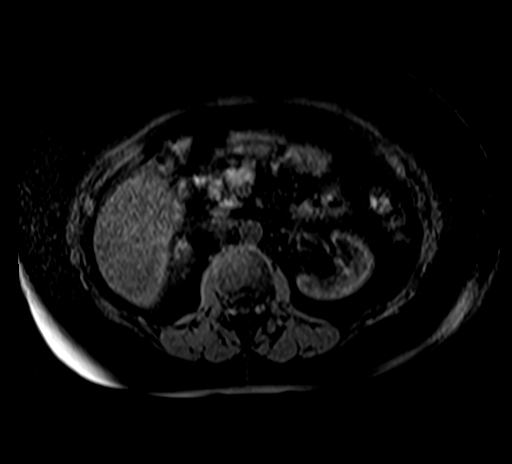
[im 69/104]
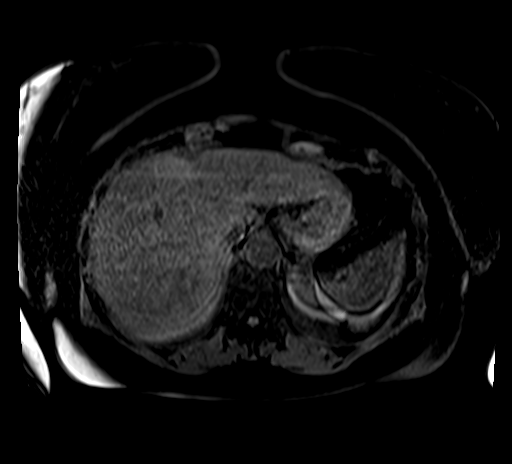
[im 104/104]
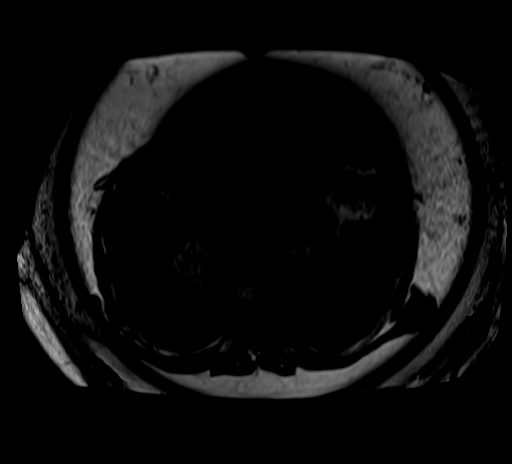

[Series 11: T2 · axial · 5.0mm · 1.41mm/px · 1 of 35 slices shown (3 of 3)]
[im 1/35]
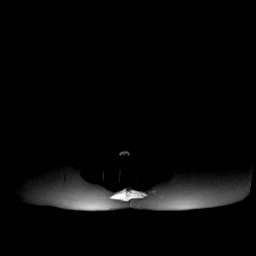

[Series 12: post 25 sec · axial · 2.2mm · 0.78mm/px · z∈[-121,+106]mm · 4 of 104 slices shown]
[im 1/104]
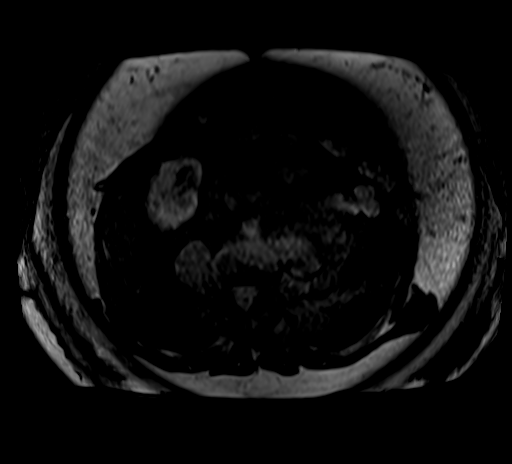
[im 35/104]
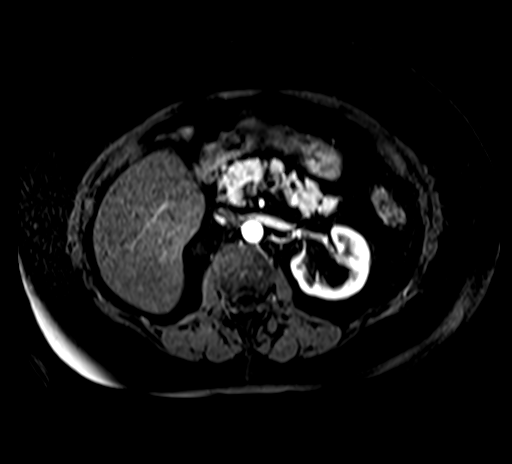
[im 69/104]
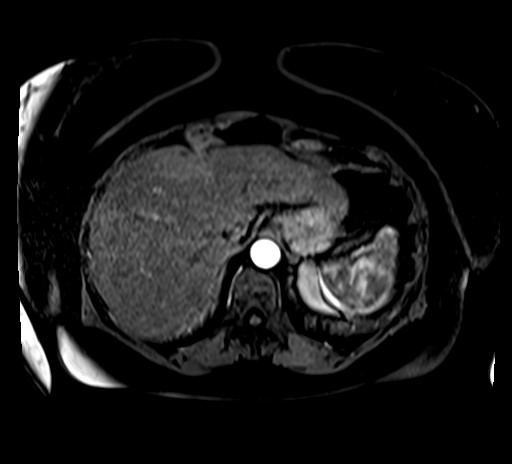
[im 104/104]
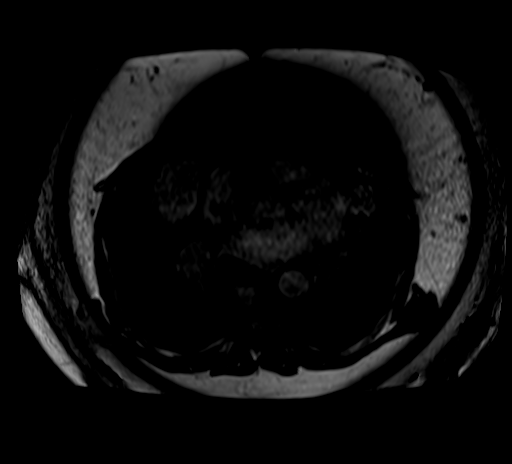

[Series 13: post 25 sec_sub · axial · 2.2mm · 0.78mm/px · z∈[-121,+106]mm · 4 of 104 slices shown]
[im 1/104]
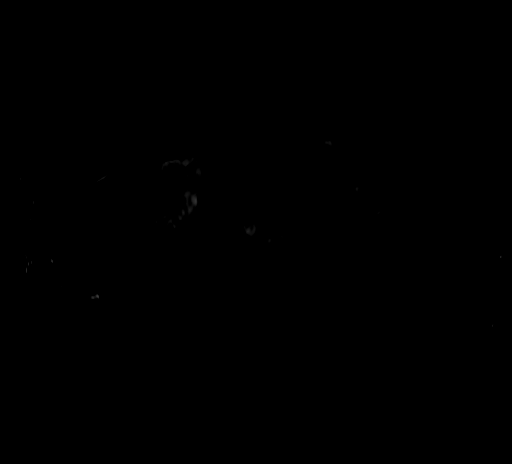
[im 35/104]
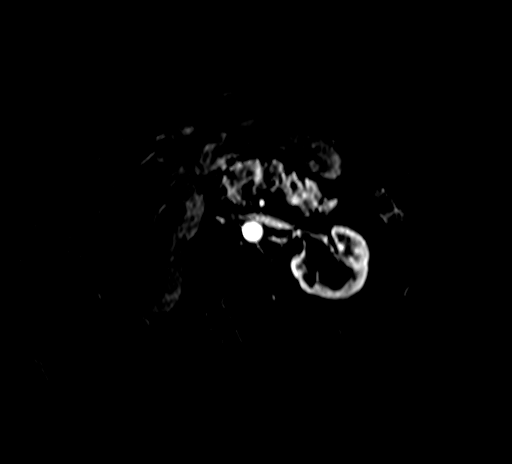
[im 69/104]
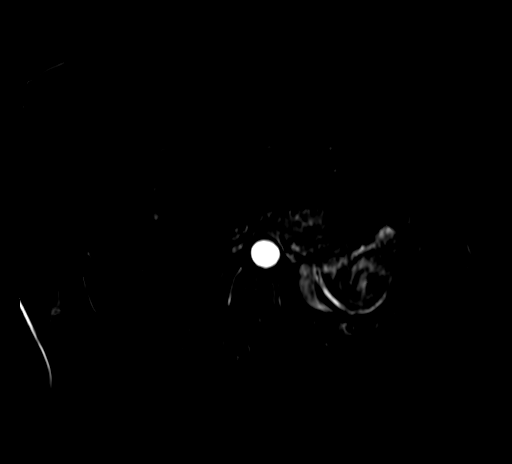
[im 104/104]
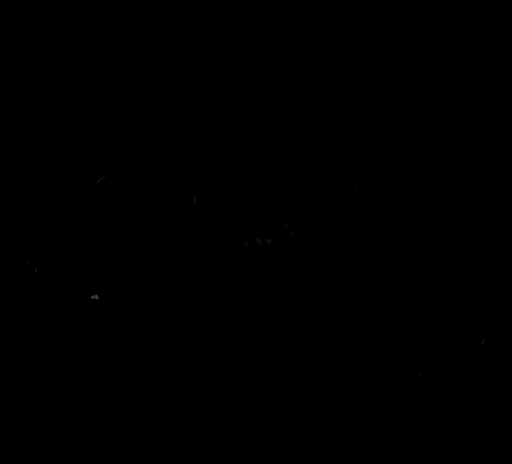

[Series 14: post 45 sec · axial · 2.2mm · 0.78mm/px · z∈[-121,-46]mm · 2 of 104 slices shown]
[im 1/104]
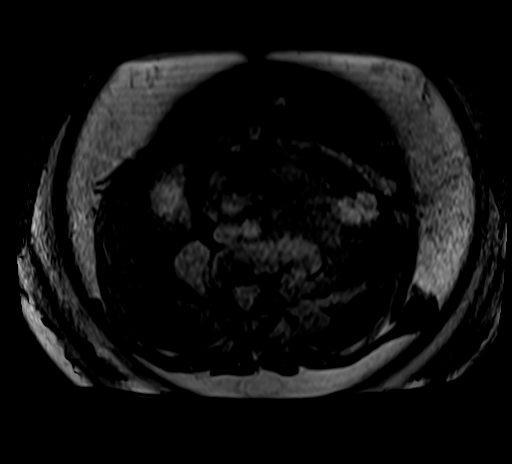
[im 35/104]
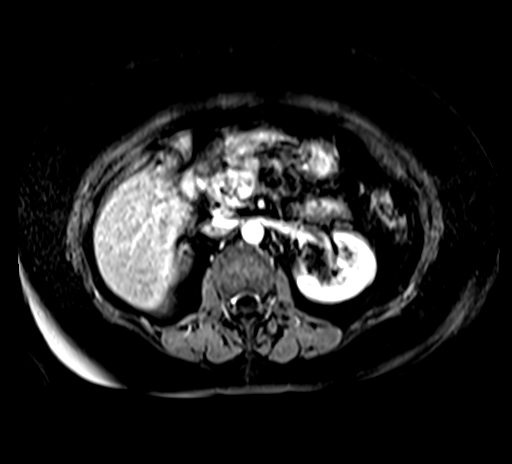

[24 of 48 positions shown; findings below may reference images not displayed]

FINDINGS: Lower chest: No acute abnormality.

Hepatobiliary: Marked hepatic steatosis with fatty sparing along the
gallbladder fossa. No suspicious hepatic lesion. Gallbladder
surgically absent. No biliary ductal dilation.

Pancreas: Intrinsic T1 signal throughout the pancreatic parenchyma
appears normal. No pancreatic ductal dilation.

Similar size of the small cystic pancreatic lesions the largest of
which is in the pancreatic head measuring 8 mm on image [DATE].
Additional lesion in the pancreatic neck measures 3-4 mm on image
[DATE]. Cystic lesion in the proximal pancreatic body measures 5 mm
on image [DATE]. Two additional cystic foci in the pancreatic tail
measure 3-4 mm on image [DATE].

Stable small splenic artery aneurysm which extends into the
pancreatic parenchyma and measures 8 mm on image 53/12.

Spleen:  Normal size spleen.

Adrenals/Urinary Tract: Stable size of the right adrenal mass which
demonstrates new uniform loss of signal on out of phase imaging with
minimal internal enhancement, measuring 3.6 cm on image 63/12,
unchanged. The lesion now demonstrates a few cystic areas measuring
up to 7 mm on image [DATE], possibly representing some degeneration.

Left adrenal glands unremarkable.

Exophytic small right hemorrhagic/proteinaceous cyst. Left renal
sinus cysts. No hydronephrosis.

Stomach/Bowel: Visualized portions within the abdomen are
unremarkable.

Vascular/Lymphatic: No pathologically enlarged lymph nodes
identified. No abdominal aortic aneurysm demonstrated.

Other:  No abdominal ascites.

Musculoskeletal: No suspicious bone lesions identified.
IMPRESSION: 1. Stable size of the 3.6 cm right adrenal mass with imaging
characteristics consistent with a benign adrenal adenoma. However,
given the size of the lesion recommend endocrine surgical consult
and biochemical analysis if not previously performed.
2. Similar size of the multiple small cystic pancreatic lesions,
measuring up to 8 mm, with imaging findings most consistent with
side branch duct intraductal papillary mucinous neoplasms. Current
guidelines suggest following these lesions every 2 years with
pancreatic protocol abdominal MRI with and without contrast. However
based on report from previous EUS mentioning main duct intraductal
papillary mucinous neoplasm as a possibility, consideration for
shortening this interval may be warranted based on GI
recommendations.
3. Marked hepatic steatosis with fatty sparing along the gallbladder
fossa.
4. Stable small splenic artery aneurysm.

## 2020-06-26 MED ORDER — GADOBENATE DIMEGLUMINE 529 MG/ML IV SOLN
17.0000 mL | Freq: Once | INTRAVENOUS | Status: AC | PRN
  Administered 2020-06-26: 17 mL via INTRAVENOUS

## 2020-07-10 DIAGNOSIS — D3501 Benign neoplasm of right adrenal gland: Secondary | ICD-10-CM | POA: Diagnosis not present

## 2020-07-19 DIAGNOSIS — D124 Benign neoplasm of descending colon: Secondary | ICD-10-CM | POA: Diagnosis not present

## 2020-07-19 DIAGNOSIS — D122 Benign neoplasm of ascending colon: Secondary | ICD-10-CM | POA: Diagnosis not present

## 2020-07-19 DIAGNOSIS — K644 Residual hemorrhoidal skin tags: Secondary | ICD-10-CM | POA: Diagnosis not present

## 2020-07-19 DIAGNOSIS — K862 Cyst of pancreas: Secondary | ICD-10-CM | POA: Diagnosis not present

## 2020-07-19 DIAGNOSIS — D3500 Benign neoplasm of unspecified adrenal gland: Secondary | ICD-10-CM | POA: Diagnosis not present

## 2020-07-19 DIAGNOSIS — K648 Other hemorrhoids: Secondary | ICD-10-CM | POA: Diagnosis not present

## 2020-07-19 DIAGNOSIS — Z1211 Encounter for screening for malignant neoplasm of colon: Secondary | ICD-10-CM | POA: Diagnosis not present

## 2020-07-19 DIAGNOSIS — I10 Essential (primary) hypertension: Secondary | ICD-10-CM | POA: Diagnosis not present

## 2020-07-19 DIAGNOSIS — K5730 Diverticulosis of large intestine without perforation or abscess without bleeding: Secondary | ICD-10-CM | POA: Diagnosis not present

## 2020-07-19 DIAGNOSIS — D125 Benign neoplasm of sigmoid colon: Secondary | ICD-10-CM | POA: Diagnosis not present

## 2020-07-19 DIAGNOSIS — I728 Aneurysm of other specified arteries: Secondary | ICD-10-CM | POA: Diagnosis not present

## 2020-07-19 DIAGNOSIS — D126 Benign neoplasm of colon, unspecified: Secondary | ICD-10-CM | POA: Diagnosis not present

## 2020-07-19 DIAGNOSIS — K635 Polyp of colon: Secondary | ICD-10-CM | POA: Diagnosis not present

## 2020-07-30 NOTE — Progress Notes (Signed)
Attempted to obtain medical history via telephone, unable to reach at this time. I left a voicemail to return pre surgical testing department's phone call.  

## 2020-08-02 ENCOUNTER — Encounter (HOSPITAL_COMMUNITY): Payer: Self-pay | Admitting: Certified Registered"

## 2020-08-02 NOTE — Anesthesia Preprocedure Evaluation (Deleted)
Anesthesia Evaluation    Reviewed: Allergy & Precautions, Patient's Chart, lab work & pertinent test results, reviewed documented beta blocker date and time   History of Anesthesia Complications (+) PONV and history of anesthetic complications  Airway        Dental   Pulmonary neg pulmonary ROS,           Cardiovascular hypertension, Pt. on medications and Pt. on home beta blockers      Neuro/Psych negative neurological ROS  negative psych ROS   GI/Hepatic Neg liver ROS, GERD  Medicated and Controlled,pancreatic cyst   Endo/Other  negative endocrine ROS  Renal/GU negative Renal ROS  negative genitourinary   Musculoskeletal negative musculoskeletal ROS (+)   Abdominal   Peds  Hematology negative hematology ROS (+)   Anesthesia Other Findings Day of surgery medications reviewed with patient.  Reproductive/Obstetrics negative OB ROS                             Anesthesia Physical Anesthesia Plan  ASA: 2  Anesthesia Plan: MAC   Post-op Pain Management:    Induction:   PONV Risk Score and Plan: Propofol infusion and Treatment may vary due to age or medical condition  Airway Management Planned: Natural Airway and Nasal Cannula  Additional Equipment: None  Intra-op Plan:   Post-operative Plan:   Informed Consent:   Plan Discussed with:   Anesthesia Plan Comments:         Anesthesia Quick Evaluation

## 2020-08-03 ENCOUNTER — Encounter (HOSPITAL_COMMUNITY): Admission: RE | Payer: Self-pay | Source: Home / Self Care

## 2020-08-03 ENCOUNTER — Ambulatory Visit (HOSPITAL_COMMUNITY): Admission: RE | Admit: 2020-08-03 | Payer: Medicare PPO | Source: Home / Self Care | Admitting: Gastroenterology

## 2020-08-03 SURGERY — UPPER ESOPHAGEAL ENDOSCOPIC ULTRASOUND (EUS)
Anesthesia: Monitor Anesthesia Care

## 2020-08-08 ENCOUNTER — Other Ambulatory Visit: Payer: Self-pay

## 2020-08-17 ENCOUNTER — Ambulatory Visit (HOSPITAL_COMMUNITY): Payer: Medicare PPO | Admitting: Anesthesiology

## 2020-08-17 ENCOUNTER — Encounter (HOSPITAL_COMMUNITY)
Admission: RE | Disposition: A | Discharge status: Home / Self Care | Payer: Self-pay | Source: Home / Self Care | Attending: Gastroenterology

## 2020-08-17 ENCOUNTER — Encounter (HOSPITAL_COMMUNITY): Payer: Self-pay | Admitting: Gastroenterology

## 2020-08-17 ENCOUNTER — Ambulatory Visit (HOSPITAL_COMMUNITY)
Admission: RE | Admit: 2020-08-17 | Discharge: 2020-08-17 | Disposition: A | Discharge status: Home / Self Care | Payer: Medicare PPO | Attending: Gastroenterology | Admitting: Gastroenterology

## 2020-08-17 DIAGNOSIS — Z8249 Family history of ischemic heart disease and other diseases of the circulatory system: Secondary | ICD-10-CM | POA: Diagnosis not present

## 2020-08-17 DIAGNOSIS — Z803 Family history of malignant neoplasm of breast: Secondary | ICD-10-CM | POA: Insufficient documentation

## 2020-08-17 DIAGNOSIS — K862 Cyst of pancreas: Secondary | ICD-10-CM | POA: Insufficient documentation

## 2020-08-17 DIAGNOSIS — Z833 Family history of diabetes mellitus: Secondary | ICD-10-CM | POA: Diagnosis not present

## 2020-08-17 DIAGNOSIS — Z881 Allergy status to other antibiotic agents status: Secondary | ICD-10-CM | POA: Diagnosis not present

## 2020-08-17 DIAGNOSIS — R933 Abnormal findings on diagnostic imaging of other parts of digestive tract: Secondary | ICD-10-CM | POA: Diagnosis not present

## 2020-08-17 HISTORY — PX: UPPER ESOPHAGEAL ENDOSCOPIC ULTRASOUND (EUS): SHX6562

## 2020-08-17 HISTORY — PX: ESOPHAGOGASTRODUODENOSCOPY (EGD) WITH PROPOFOL: SHX5813

## 2020-08-17 SURGERY — UPPER ENDOSCOPIC ULTRASOUND (EUS) LINEAR
Anesthesia: Monitor Anesthesia Care

## 2020-08-17 SURGERY — UPPER ESOPHAGEAL ENDOSCOPIC ULTRASOUND (EUS)
Anesthesia: Monitor Anesthesia Care

## 2020-08-17 MED ORDER — SODIUM CHLORIDE 0.9 % IV SOLN: INTRAVENOUS | Status: DC

## 2020-08-17 MED ORDER — PROPOFOL 10 MG/ML IV BOLUS
INTRAVENOUS | Status: DC | PRN
  Administered 2020-08-17: 40 mg via INTRAVENOUS
  Administered 2020-08-17: 10 mg via INTRAVENOUS

## 2020-08-17 MED ORDER — LIDOCAINE 2% (20 MG/ML) 5 ML SYRINGE
INTRAMUSCULAR | Status: DC | PRN
  Administered 2020-08-17: 100 mg via INTRAVENOUS

## 2020-08-17 MED ORDER — PROPOFOL 500 MG/50ML IV EMUL
INTRAVENOUS | Status: DC | PRN
Start: 2020-08-17 — End: 2020-08-17
  Administered 2020-08-17: 200 ug/kg/min via INTRAVENOUS

## 2020-08-17 MED ORDER — LACTATED RINGERS IV SOLN: INTRAVENOUS | Status: DC

## 2020-08-17 NOTE — Discharge Instructions (Signed)

## 2020-08-17 NOTE — Transfer of Care (Signed)
Immediate Anesthesia Transfer of Care Note  Patient: Stacy Clayton  Procedure(s) Performed: UPPER ESOPHAGEAL ENDOSCOPIC ULTRASOUND (EUS)  Patient Location: Endoscopy Unit  Anesthesia Type:MAC  Level of Consciousness: sedated, patient cooperative and responds to stimulation  Airway & Oxygen Therapy: Patient Spontanous Breathing and Patient connected to face mask oxygen  Post-op Assessment: Report given to RN, Post -op Vital signs reviewed and stable and Patient moving all extremities  Post vital signs: Reviewed and stable  Last Vitals:  Vitals Value Taken Time  BP 109/54 08/17/20 1136  Temp    Pulse 69 08/17/20 1137  Resp 15 08/17/20 1137  SpO2 100 % 08/17/20 1137  Vitals shown include unvalidated device data.  Last Pain:  Vitals:   08/17/20 1041  TempSrc: Oral  PainSc: 3       Patients Stated Pain Goal: 0 (50/53/97 6734)  Complications: No notable events documented.

## 2020-08-17 NOTE — Anesthesia Preprocedure Evaluation (Signed)
Anesthesia Evaluation  Patient identified by MRN, date of birth, ID band Patient awake    Reviewed: Allergy & Precautions, NPO status , Patient's Chart, lab work & pertinent test results  History of Anesthesia Complications (+) PONV and history of anesthetic complications  Airway Mallampati: II  TM Distance: >3 FB Neck ROM: Full    Dental no notable dental hx. (+) Dental Advisory Given   Pulmonary neg pulmonary ROS,    Pulmonary exam normal        Cardiovascular Pt. on home beta blockers Normal cardiovascular exam     Neuro/Psych negative neurological ROS     GI/Hepatic negative GI ROS, Neg liver ROS, Pancreatic mass   Endo/Other  negative endocrine ROS  Renal/GU negative Renal ROS     Musculoskeletal negative musculoskeletal ROS (+)   Abdominal   Peds  Hematology negative hematology ROS (+)   Anesthesia Other Findings   Reproductive/Obstetrics                             Anesthesia Physical  Anesthesia Plan  ASA: 2  Anesthesia Plan: MAC   Post-op Pain Management:    Induction:   PONV Risk Score and Plan: 3 and Ondansetron and Propofol infusion  Airway Management Planned: Simple Face Mask and Natural Airway  Additional Equipment: None  Intra-op Plan:   Post-operative Plan:   Informed Consent: I have reviewed the patients History and Physical, chart, labs and discussed the procedure including the risks, benefits and alternatives for the proposed anesthesia with the patient or authorized representative who has indicated his/her understanding and acceptance.     Dental advisory given  Plan Discussed with: Anesthesiologist and CRNA  Anesthesia Plan Comments:         Anesthesia Quick Evaluation

## 2020-08-17 NOTE — Op Note (Signed)
[facility] Patient Name: Stacy Clayton Procedure Date: 08/17/2020 MRN: [id number] Attending MD: Carol Ada , MD Date of Birth: July 04, 1950 CSN: [id number] Age: 70 Admit Type: Outpatient Procedure:                Upper EUS Indications:              Pancreatic cyst on MRCP Providers:                Carol Ada, MD, Burtis Junes, RN, Elspeth Cho                            Tech., Technician, Barrett Henle, CRNA Referring MD:              Medicines:                Propofol per Anesthesia Complications:            No immediate complications. Estimated Blood Loss:     Estimated blood loss: none. Procedure:                Pre-Anesthesia Assessment:                           - Prior to the procedure, a History and Physical                            was performed, and patient medications and                            allergies were reviewed. The patient's tolerance of                            previous anesthesia was also reviewed. The risks                            and benefits of the procedure and the sedation                            options and risks were discussed with the patient.                            All questions were answered, and informed consent                            was obtained. Prior Anticoagulants: The patient has                            taken no previous anticoagulant or antiplatelet                            agents. ASA Grade Assessment: II - A patient with                            mild systemic disease. After reviewing the risks  and benefits, the patient was deemed in                            satisfactory condition to undergo the procedure.                           - Sedation was administered by an anesthesia                            professional. Deep sedation was attained.                           After obtaining informed consent, the endoscope was                            passed under direct  vision. Throughout the                            procedure, the patient's blood pressure, pulse, and                            oxygen saturations were monitored continuously. The                            [id number]) Olympus linear ultrasound scope                            was introduced through the mouth, and advanced to                            the second part of duodenum. The upper EUS was                            accomplished without difficulty. The patient                            tolerated the procedure well. Scope In: Scope Out: Findings:      ENDOSONOGRAPHIC FINDING: :      An anechoic lesion suggestive of a cyst was identified in the pancreatic       head and genu of the pancreas. It does not communicate with the       pancreatic duct. The lesion measured 8 mm by 9 mm in maximal       cross-sectional diameter. There was a single compartment without septae.       The outer wall of the lesion was thin. There was no associated mass.       There was no internal debris within the fluid-filled cavity.      A clearer view of the cyst was identified in the head of the pancreas.       With this examination there is NO evidence of main PD communication       between the cyst and the main duct. The previous thought that the cyst       was from the main PD showed that the CBD was in close proximity to the  cyst, which gave the appearance of communication. A smaller 3.9 mm cyst       was noted in the neck/body region of the pancreas. Fatty liver was       identified. Impression:               - A cystic lesion was seen in the pancreatic head                            and genu of the pancreas.                           - No specimens collected. Moderate Sedation:      Not Applicable - Patient had care per Anesthesia. Recommendation:           - Return patient to hospital ward for ongoing care.                           - Follow up imaging in 2 years routine follow  up of                            the right adrenal gland. Procedure Code(s):        --- Professional ---                           [phone], Esophagogastroduodenoscopy, flexible,                            transoral; with endoscopic ultrasound examination                            limited to the esophagus, stomach or duodenum, and                            adjacent structures Diagnosis Code(s):        --- Professional ---                           K86.2, Cyst of pancreas CPT copyright 2019 [employer]. All rights reserved. The codes documented in this report are preliminary and upon coder review may  be revised to meet current compliance requirements. Carol Ada, MD Carol Ada, MD 08/17/2020 11:45:33 AM This report has been signed electronically. Number of Addenda: 0

## 2020-08-17 NOTE — H&P (Signed)
Stacy Clayton HPI: The patient has a history of pancreatic cysts.  The head of the pancreas cyst showed the possibility of a main duct communication.  The most recent MRI does not show any interval increase in the size of the cyst.  Past Medical History:  Diagnosis Date   Cancer ([place])    PONV (postoperative nausea and vomiting)     Past Surgical History:  Procedure Laterality Date   BREAST BIOPSY Left    BREAST LUMPECTOMY WITH RADIOACTIVE SEED LOCALIZATION Left 09/30/2018   Procedure: LEFT BREAST LUMPECTOMY WITH RADIOACTIVE SEED LOCALIZATION;  Surgeon: Erroll Luna, MD;  Location: [facility];  Service: General;  Laterality: Left;   CESAREAN SECTION     CHOLECYSTECTOMY     ESOPHAGOGASTRODUODENOSCOPY (EGD) WITH PROPOFOL N/A 12/23/2019   Procedure: ESOPHAGOGASTRODUODENOSCOPY (EGD) WITH PROPOFOL;  Surgeon: Carol Ada, MD;  Location: [facility];  Service: Endoscopy;  Laterality: N/A;   INCISION AND DRAINAGE  12/12/2011   Procedure: INCISION AND DRAINAGE;  Surgeon: Tennis Must, MD;  Location: [facility];  Service: Orthopedics;  Laterality: Left;  Incision and drainage of open left thumb distal phalanax fracture and nail bed injury   NAILBED REPAIR  12/12/2011   Procedure: NAILBED REPAIR;  Surgeon: Tennis Must, MD;  Location: [facility];  Service: Orthopedics;  Laterality: Left;   PERCUTANEOUS PINNING  12/12/2011   Procedure: PERCUTANEOUS PINNING EXTREMITY;  Surgeon: Tennis Must, MD;  Location: [facility];  Service: Orthopedics;  Laterality: Left;  Pinning of distal phalanax fracture of left thumb   SKIN CANCER EXCISION     TONSILLECTOMY     UPPER ESOPHAGEAL ENDOSCOPIC ULTRASOUND (EUS) N/A 12/23/2019   Procedure: UPPER ESOPHAGEAL ENDOSCOPIC ULTRASOUND (EUS);  Surgeon: Carol Ada, MD;  Location: [facility];  Service: Endoscopy;  Laterality: N/A;    Family History  Problem Relation Age of Onset   Hypertension Mother    Diabetes Mother    Cancer Mother    Breast cancer  Maternal Aunt    Breast cancer Cousin     Social History:  reports that she has never smoked. She has never used smokeless tobacco. She reports that she does not drink alcohol. No history on file for drug use.  Allergies:  Allergies  Allergen Reactions   Tetracycline Nausea Only    Medications: Scheduled: Continuous:  sodium chloride     lactated ringers 20 mL/hr at 08/17/20 1035    No results found for this or any previous visit (from the past 24 hour(s)).   No results found.  ROS:  As stated above in the HPI otherwise negative.  Blood pressure (!) 143/87, pulse 69, temperature 98.1 F (36.7 C), temperature source Oral, resp. rate 19, height '5\' 6"'$  (1.676 m), weight 86.2 kg, SpO2 98 %.    PE: Gen: NAD, Alert and Oriented HEENT:  [place]/AT, EOMI Neck: Supple, no LAD Lungs: CTA Bilaterally CV: RRR without M/G/R ABD: Soft, NTND, +BS Ext: No C/C/E  Assessment/Plan: 1) Pancreatic cyst - follow up EUS.  Meral Geissinger D 08/17/2020, 11:01 AM

## 2020-08-19 NOTE — Anesthesia Postprocedure Evaluation (Signed)
Anesthesia Post Note  Patient: Stacy Clayton  Procedure(s) Performed: UPPER ESOPHAGEAL ENDOSCOPIC ULTRASOUND (EUS)     Patient location during evaluation: PACU Anesthesia Type: MAC Level of consciousness: awake and alert Pain management: pain level controlled Vital Signs Assessment: post-procedure vital signs reviewed and stable Respiratory status: spontaneous breathing, nonlabored ventilation, respiratory function stable and patient connected to nasal cannula oxygen Cardiovascular status: stable and blood pressure returned to baseline Postop Assessment: no apparent nausea or vomiting Anesthetic complications: no   No notable events documented.  Last Vitals:  Vitals:   08/17/20 1150 08/17/20 1200  BP: 115/60 119/60  Pulse: 64 63  Resp: 17 14  Temp:    SpO2: 97% 99%    Last Pain:  Vitals:   08/17/20 1200  TempSrc:   PainSc: 0-No pain   Pain Goal: Patients Stated Pain Goal: 0 (08/17/20 1041)                 Lorenso Quirino

## 2020-08-21 ENCOUNTER — Encounter (HOSPITAL_COMMUNITY): Payer: Self-pay | Admitting: Gastroenterology

## 2020-08-28 DIAGNOSIS — L820 Inflamed seborrheic keratosis: Secondary | ICD-10-CM | POA: Diagnosis not present

## 2020-08-28 DIAGNOSIS — Z85820 Personal history of malignant melanoma of skin: Secondary | ICD-10-CM | POA: Diagnosis not present

## 2020-08-28 DIAGNOSIS — L578 Other skin changes due to chronic exposure to nonionizing radiation: Secondary | ICD-10-CM | POA: Diagnosis not present

## 2020-08-28 DIAGNOSIS — L570 Actinic keratosis: Secondary | ICD-10-CM | POA: Diagnosis not present

## 2020-08-28 DIAGNOSIS — L821 Other seborrheic keratosis: Secondary | ICD-10-CM | POA: Diagnosis not present

## 2020-09-24 DIAGNOSIS — I82441 Acute embolism and thrombosis of right tibial vein: Secondary | ICD-10-CM | POA: Diagnosis not present

## 2020-09-24 DIAGNOSIS — Z6830 Body mass index (BMI) 30.0-30.9, adult: Secondary | ICD-10-CM | POA: Diagnosis not present

## 2020-09-24 DIAGNOSIS — I824Z1 Acute embolism and thrombosis of unspecified deep veins of right distal lower extremity: Secondary | ICD-10-CM | POA: Diagnosis not present

## 2020-09-24 DIAGNOSIS — M62831 Muscle spasm of calf: Secondary | ICD-10-CM | POA: Diagnosis not present

## 2020-09-24 DIAGNOSIS — I82401 Acute embolism and thrombosis of unspecified deep veins of right lower extremity: Secondary | ICD-10-CM | POA: Diagnosis not present

## 2020-09-24 DIAGNOSIS — M79604 Pain in right leg: Secondary | ICD-10-CM | POA: Diagnosis not present

## 2020-09-24 DIAGNOSIS — Z1331 Encounter for screening for depression: Secondary | ICD-10-CM | POA: Diagnosis not present

## 2020-09-24 DIAGNOSIS — Z9181 History of falling: Secondary | ICD-10-CM | POA: Diagnosis not present

## 2020-10-08 DIAGNOSIS — M5431 Sciatica, right side: Secondary | ICD-10-CM | POA: Diagnosis not present

## 2020-10-08 DIAGNOSIS — I82401 Acute embolism and thrombosis of unspecified deep veins of right lower extremity: Secondary | ICD-10-CM | POA: Diagnosis not present

## 2020-10-08 DIAGNOSIS — Z6830 Body mass index (BMI) 30.0-30.9, adult: Secondary | ICD-10-CM | POA: Diagnosis not present

## 2020-10-08 DIAGNOSIS — M25551 Pain in right hip: Secondary | ICD-10-CM | POA: Diagnosis not present

## 2020-10-18 DIAGNOSIS — I10 Essential (primary) hypertension: Secondary | ICD-10-CM | POA: Diagnosis not present

## 2020-10-18 DIAGNOSIS — Z7722 Contact with and (suspected) exposure to environmental tobacco smoke (acute) (chronic): Secondary | ICD-10-CM | POA: Diagnosis not present

## 2020-10-18 DIAGNOSIS — Z823 Family history of stroke: Secondary | ICD-10-CM | POA: Diagnosis not present

## 2020-10-18 DIAGNOSIS — Z6830 Body mass index (BMI) 30.0-30.9, adult: Secondary | ICD-10-CM | POA: Diagnosis not present

## 2020-10-18 DIAGNOSIS — Z7901 Long term (current) use of anticoagulants: Secondary | ICD-10-CM | POA: Diagnosis not present

## 2020-10-18 DIAGNOSIS — K219 Gastro-esophageal reflux disease without esophagitis: Secondary | ICD-10-CM | POA: Diagnosis not present

## 2020-10-18 DIAGNOSIS — E669 Obesity, unspecified: Secondary | ICD-10-CM | POA: Diagnosis not present

## 2020-10-18 DIAGNOSIS — Z85828 Personal history of other malignant neoplasm of skin: Secondary | ICD-10-CM | POA: Diagnosis not present

## 2020-10-18 DIAGNOSIS — Z809 Family history of malignant neoplasm, unspecified: Secondary | ICD-10-CM | POA: Diagnosis not present

## 2020-11-13 DIAGNOSIS — R7309 Other abnormal glucose: Secondary | ICD-10-CM | POA: Diagnosis not present

## 2020-11-13 DIAGNOSIS — I82401 Acute embolism and thrombosis of unspecified deep veins of right lower extremity: Secondary | ICD-10-CM | POA: Diagnosis not present

## 2020-11-13 DIAGNOSIS — Z6830 Body mass index (BMI) 30.0-30.9, adult: Secondary | ICD-10-CM | POA: Diagnosis not present

## 2020-11-14 DIAGNOSIS — Z23 Encounter for immunization: Secondary | ICD-10-CM | POA: Diagnosis not present

## 2020-11-16 ENCOUNTER — Telehealth: Payer: Self-pay | Admitting: Oncology

## 2020-11-16 NOTE — Telephone Encounter (Signed)
Scheduled appt per 11/11 referral. Pt is aware of appt date and time.  

## 2020-12-06 ENCOUNTER — Other Ambulatory Visit: Payer: Self-pay | Admitting: Oncology

## 2020-12-06 DIAGNOSIS — D751 Secondary polycythemia: Secondary | ICD-10-CM

## 2020-12-06 NOTE — Progress Notes (Signed)
Stacy [place]  [address] [place],  Stacy Clayton  [id number] [phone]  Clinic Day:  12/12/2020  Referring physician: Serita Grammes, MD   HISTORY OF PRESENT ILLNESS:  The patient is a 70 y.o. female  who I was asked to consult upon for a below-knee DVT.  In September 2022, the patient recalls having right leg pain for approximately 1 week.  She denies ever having any trauma to this leg.  There was also no history of any previous right leg/hip/knee surgery.  As this leg pain persisted, a Doppler ultrasound was eventually done, which revealed a DVT in one of her right posterior tibial veins.  This blood clot did not extend above her knee.  Based upon this, she was placed on Xarelto, which she has been taking ever since.  The patient denies having a history of blood clots.  Furthermore, there is no family history of blood clots.  She claims to have had her age-appropriate cancer screenings, including her mammogram and colonoscopy.  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  PAST MEDICAL HISTORY:   Past Medical History:  Diagnosis Date   Cancer ([place])    PONV (postoperative nausea and vomiting)   Diabetes, hypertension, gastroesophageal reflux disease, 2 melanomas  PAST SURGICAL HISTORY:   Past Surgical History:  Procedure Laterality Date   BREAST BIOPSY Left    BREAST LUMPECTOMY WITH RADIOACTIVE SEED LOCALIZATION Left 09/30/2018   Procedure: LEFT BREAST LUMPECTOMY WITH RADIOACTIVE SEED LOCALIZATION;  Surgeon: Erroll Luna, MD;  Location: [facility];  Service: General;  Laterality: Left;   CESAREAN SECTION     CHOLECYSTECTOMY     ESOPHAGOGASTRODUODENOSCOPY (EGD) WITH PROPOFOL N/A 12/23/2019   Procedure: ESOPHAGOGASTRODUODENOSCOPY (EGD) WITH PROPOFOL;  Surgeon: Carol Ada, MD;  Location: [facility];  Service: Endoscopy;  Laterality: N/A;   ESOPHAGOGASTRODUODENOSCOPY (EGD) WITH PROPOFOL N/A  08/17/2020   Procedure: ESOPHAGOGASTRODUODENOSCOPY (EGD) WITH PROPOFOL;  Surgeon: Carol Ada, MD;  Location: [facility];  Service: Endoscopy;  Laterality: N/A;   INCISION AND DRAINAGE  12/12/2011   Procedure: INCISION AND DRAINAGE;  Surgeon: Tennis Must, MD;  Location: [facility];  Service: Orthopedics;  Laterality: Left;  Incision and drainage of open left thumb distal phalanax fracture and nail bed injury   NAILBED REPAIR  12/12/2011   Procedure: NAILBED REPAIR;  Surgeon: Tennis Must, MD;  Location: [facility];  Service: Orthopedics;  Laterality: Left;   PERCUTANEOUS PINNING  12/12/2011   Procedure: PERCUTANEOUS PINNING EXTREMITY;  Surgeon: Tennis Must, MD;  Location: [facility];  Service: Orthopedics;  Laterality: Left;  Pinning of distal phalanax fracture of left thumb   SKIN CANCER EXCISION     TONSILLECTOMY     UPPER ESOPHAGEAL ENDOSCOPIC ULTRASOUND (EUS) N/A 12/23/2019   Procedure: UPPER ESOPHAGEAL ENDOSCOPIC ULTRASOUND (EUS);  Surgeon: Carol Ada, MD;  Location: [facility];  Service: Endoscopy;  Laterality: N/A;   UPPER ESOPHAGEAL ENDOSCOPIC ULTRASOUND (EUS) N/A 08/17/2020   Procedure: UPPER ESOPHAGEAL ENDOSCOPIC ULTRASOUND (EUS);  Surgeon: Carol Ada, MD;  Location: [facility];  Service: Endoscopy;  Laterality: N/A;    CURRENT MEDICATIONS:   Current Outpatient Medications  Medication Sig Dispense Refill   metFORMIN (GLUCOPHAGE) 500 MG tablet Take by mouth 2 (two) times daily with a meal.     rivaroxaban (XARELTO) 20 MG TABS tablet Take 20 mg by mouth daily with supper.     acetaminophen (TYLENOL) 325 MG tablet Take 325 mg  by mouth every 6 (six) hours as needed for headache.     Calcium Carb-Cholecalciferol (CALCIUM 600+D3 PO) Take 1 tablet by mouth every evening.     Cholecalciferol (VITAMIN D3) 50 MCG (2000 UT) TABS Take 2,000 Units by mouth every evening.     Ciclopirox 1 % shampoo Apply 1 application topically 3 (three) times a week.     clobetasol (TEMOVATE) 0.05 % external  solution Apply 1 application topically 2 (two) times a week. Applied to affected area(s) of scalp     ibuprofen (ADVIL) 200 MG tablet Take 200 mg by mouth every 8 (eight) hours as needed for mild pain or headache (pain.).     Ibuprofen-diphenhydrAMINE HCl (ADVIL PM) 200-25 MG CAPS Take 1 capsule by mouth at bedtime as needed (Sleep).     Melatonin 10 MG TABS Take 10 mg by mouth at bedtime.     metoprolol tartrate (LOPRESSOR) 25 MG tablet Take 12.5 mg by mouth in the morning and at bedtime.     omeprazole (PRILOSEC) 40 MG capsule Take 40 mg by mouth daily before breakfast.     Psyllium (METAMUCIL PO) Take 1 packet by mouth daily.     RESTASIS 0.05 % ophthalmic emulsion Place 1 drop into both eyes 2 (two) times daily.     SUMAtriptan (IMITREX) 50 MG tablet Take 50 mg by mouth every 2 (two) hours as needed for migraine.     triamterene-hydrochlorothiazide (DYAZIDE) 37.5-25 MG capsule Take 1 capsule by mouth in the morning.     vitamin B-12 (CYANOCOBALAMIN) 1000 MCG tablet Take 1,000 mcg by mouth every evening.     No current facility-administered medications for this visit.    ALLERGIES:   Allergies  Allergen Reactions   Tetracycline Nausea Only    FAMILY HISTORY:   Family History  Problem Relation Age of Onset   Hypertension Mother    Diabetes Mother    Cancer Mother    Breast cancer Maternal Aunt    Breast cancer Cousin   Her father died from lung cancer.  Her mother died from a stroke.  She has 3 brothers, 1 of whom died from metastatic prostate cancer.  Another brother has prostate cancer, while her third brother has leukemia.  SOCIAL HISTORY:  The patient was born and raised in [place], [place].  She currently lives in town with her husband 29 years.  She has 1 child.  She was involved in bank work for 40 years.  There is no history of alcoholism or tobacco abuse.  REVIEW OF SYSTEMS:  Review of Systems  Constitutional:  Negative for fatigue and fever.  HENT:    Negative for hearing loss and sore throat.   Eyes:  Negative for eye problems.  Respiratory:  Positive for shortness of breath. Negative for chest tightness, cough and hemoptysis.   Cardiovascular:  Negative for chest pain and palpitations.  Gastrointestinal:  Positive for nausea. Negative for abdominal distention, abdominal pain, blood in stool, constipation, diarrhea and vomiting.  Endocrine: Negative for hot flashes.  Genitourinary:  Negative for difficulty urinating, dysuria, frequency, hematuria and nocturia.   Musculoskeletal:  Positive for arthralgias. Negative for back pain, gait problem and myalgias.  Skin: Negative.  Negative for itching and rash.  Neurological: Negative.  Negative for dizziness, extremity weakness, gait problem, headaches, light-headedness and numbness.  Hematological: Negative.   Psychiatric/Behavioral: Negative.  Negative for depression and suicidal ideas. The patient is not nervous/anxious.     PHYSICAL EXAM:  Blood pressure Marland Kitchen)  142/83, pulse 64, temperature 97.9 F (36.6 C), resp. rate 16, height 5\' 6"  (1.676 m), weight 185 lb 14.4 oz (84.3 kg), SpO2 96 %. Wt Readings from Last 3 Encounters:  12/07/20 185 lb 14.4 oz (84.3 kg)  08/17/20 190 lb 0.6 oz (86.2 kg)  12/23/19 185 lb (83.9 kg)   Body mass index is 30.01 kg/m. Performance status (ECOG): 0 - Asymptomatic Physical Exam Constitutional:      Appearance: Normal appearance. She is not ill-appearing.  HENT:     Mouth/Throat:     Mouth: Mucous membranes are moist.     Pharynx: Oropharynx is clear. No oropharyngeal exudate or posterior oropharyngeal erythema.  Cardiovascular:     Rate and Rhythm: Normal rate and regular rhythm.     Heart sounds: No murmur heard.   No friction rub. No gallop.  Pulmonary:     Effort: Pulmonary effort is normal. No respiratory distress.     Breath sounds: Normal breath sounds. No wheezing, rhonchi or rales.  Abdominal:     General: Bowel sounds are normal. There is  no distension.     Palpations: Abdomen is soft. There is no mass.     Tenderness: There is no abdominal tenderness.  Musculoskeletal:        General: No swelling.     Right lower leg: No edema.     Left lower leg: No edema.  Lymphadenopathy:     Cervical: No cervical adenopathy.     Upper Body:     Right upper body: No supraclavicular or axillary adenopathy.     Left upper body: No supraclavicular or axillary adenopathy.     Lower Body: No right inguinal adenopathy. No left inguinal adenopathy.  Skin:    General: Skin is warm.     Coloration: Skin is not jaundiced.     Findings: No lesion or rash.  Neurological:     General: No focal deficit present.     Mental Status: She is alert and oriented to person, place, and time. Mental status is at baseline.  Psychiatric:        Mood and Affect: Mood normal.        Behavior: Behavior normal.        Thought Content: Thought content normal.    LABS:   CBC Latest Ref Rng & Units 12/07/2020 09/27/2018 12/12/2011  WBC - 9.0 9.5 8.7  Hemoglobin 12.0 - 16.0 15.0 14.6 14.7  Hematocrit 36 - 46 46 46.6(H) 44.2  Platelets 150 - 399 212 250 244   CMP Latest Ref Rng & Units 09/27/2018 12/12/2011  Glucose 70 - 99 mg/dL 119(H) 86  BUN 8 - 23 mg/dL 16 10  Creatinine 0.44 - 1.00 mg/dL 0.76 0.64  Sodium 135 - 145 mmol/L 140 140  Potassium 3.5 - 5.1 mmol/L 4.3 3.7  Chloride 98 - 111 mmol/L 108 105  CO2 22 - 32 mmol/L 23 23  Calcium 8.9 - 10.3 mg/dL 9.2 9.5  Total Protein 6.5 - 8.1 g/dL 6.2(L) -  Total Bilirubin 0.3 - 1.2 mg/dL 0.3 -  Alkaline Phos 38 - 126 U/L 67 -  AST 15 - 41 U/L 28 -  ALT 0 - 44 U/L 23 -   ASSESSMENT & PLAN:  A 70 y.o. female who I was asked to consult upon for a below-knee DVT.  As mentioned previously, the patient has been on Xarelto since late September 2022.  I do believe she can discontinue her Xarelto after completing 3 total months  of anticoagulation.  As this is a below-knee DVT, I do not believe prolonged  anticoagulation or a hypercoagulable work-up is necessary at this time.  However, if another blood clot was to develop over time, the patient understands lifelong anticoagulation would be recommended.  I would also recommend a hypercoagulable work-up being done if an unprovoked blood clot develops in the future.  As she has no other pressing hematologic issues, I do feel comfortable turning her care back over to her primary care office. The patient understands all the plans discussed today and is in agreement with them.  I do appreciate Serita Grammes, MD for his new consult.   Medard Decuir Macarthur Critchley, MD

## 2020-12-07 ENCOUNTER — Other Ambulatory Visit: Payer: Medicare PPO

## 2020-12-07 ENCOUNTER — Encounter: Payer: Medicare PPO | Admitting: Oncology

## 2020-12-07 ENCOUNTER — Other Ambulatory Visit: Payer: Self-pay

## 2020-12-07 ENCOUNTER — Other Ambulatory Visit: Payer: Self-pay | Admitting: Hematology and Oncology

## 2020-12-07 ENCOUNTER — Inpatient Hospital Stay: Payer: Medicare PPO | Admitting: Oncology

## 2020-12-07 ENCOUNTER — Inpatient Hospital Stay: Payer: Medicare PPO | Attending: Oncology

## 2020-12-07 DIAGNOSIS — Z801 Family history of malignant neoplasm of trachea, bronchus and lung: Secondary | ICD-10-CM

## 2020-12-07 DIAGNOSIS — Z7901 Long term (current) use of anticoagulants: Secondary | ICD-10-CM

## 2020-12-07 DIAGNOSIS — I824Z1 Acute embolism and thrombosis of unspecified deep veins of right distal lower extremity: Secondary | ICD-10-CM | POA: Diagnosis not present

## 2020-12-07 DIAGNOSIS — D751 Secondary polycythemia: Secondary | ICD-10-CM

## 2020-12-07 DIAGNOSIS — D582 Other hemoglobinopathies: Secondary | ICD-10-CM | POA: Diagnosis not present

## 2020-12-07 DIAGNOSIS — Z803 Family history of malignant neoplasm of breast: Secondary | ICD-10-CM | POA: Diagnosis not present

## 2020-12-07 DIAGNOSIS — Z8042 Family history of malignant neoplasm of prostate: Secondary | ICD-10-CM | POA: Diagnosis not present

## 2020-12-07 LAB — CBC AND DIFFERENTIAL
HCT: 46 (ref 36–46)
Hemoglobin: 15 (ref 12.0–16.0)
Neutrophils Absolute: 5.13
Platelets: 212 (ref 150–399)
WBC: 9

## 2020-12-07 LAB — CBC: RBC: 5.56 — AB (ref 3.87–5.11)

## 2020-12-12 DIAGNOSIS — I824Z1 Acute embolism and thrombosis of unspecified deep veins of right distal lower extremity: Secondary | ICD-10-CM | POA: Insufficient documentation

## 2020-12-12 HISTORY — DX: Acute embolism and thrombosis of unspecified deep veins of right distal lower extremity: I82.4Z1

## 2020-12-19 ENCOUNTER — Telehealth: Payer: Self-pay | Admitting: Oncology

## 2020-12-19 NOTE — Telephone Encounter (Signed)
Per 12/2 Office Note, patient's care returned to PCP

## 2020-12-20 DIAGNOSIS — Z6830 Body mass index (BMI) 30.0-30.9, adult: Secondary | ICD-10-CM | POA: Diagnosis not present

## 2020-12-20 DIAGNOSIS — Z13220 Encounter for screening for lipoid disorders: Secondary | ICD-10-CM | POA: Diagnosis not present

## 2020-12-20 DIAGNOSIS — Z79899 Other long term (current) drug therapy: Secondary | ICD-10-CM | POA: Diagnosis not present

## 2020-12-20 DIAGNOSIS — K219 Gastro-esophageal reflux disease without esophagitis: Secondary | ICD-10-CM | POA: Diagnosis not present

## 2020-12-20 DIAGNOSIS — Z0000 Encounter for general adult medical examination without abnormal findings: Secondary | ICD-10-CM | POA: Diagnosis not present

## 2020-12-20 DIAGNOSIS — I10 Essential (primary) hypertension: Secondary | ICD-10-CM | POA: Diagnosis not present

## 2021-02-13 DIAGNOSIS — J209 Acute bronchitis, unspecified: Secondary | ICD-10-CM | POA: Diagnosis not present

## 2021-02-13 DIAGNOSIS — R051 Acute cough: Secondary | ICD-10-CM | POA: Diagnosis not present

## 2021-02-13 DIAGNOSIS — Z20828 Contact with and (suspected) exposure to other viral communicable diseases: Secondary | ICD-10-CM | POA: Diagnosis not present

## 2021-02-13 DIAGNOSIS — Z6829 Body mass index (BMI) 29.0-29.9, adult: Secondary | ICD-10-CM | POA: Diagnosis not present

## 2021-02-20 DIAGNOSIS — E1142 Type 2 diabetes mellitus with diabetic polyneuropathy: Secondary | ICD-10-CM | POA: Diagnosis not present

## 2021-02-20 DIAGNOSIS — I82401 Acute embolism and thrombosis of unspecified deep veins of right lower extremity: Secondary | ICD-10-CM | POA: Diagnosis not present

## 2021-02-20 DIAGNOSIS — I10 Essential (primary) hypertension: Secondary | ICD-10-CM | POA: Diagnosis not present

## 2021-02-20 DIAGNOSIS — Z6829 Body mass index (BMI) 29.0-29.9, adult: Secondary | ICD-10-CM | POA: Diagnosis not present

## 2021-02-26 DIAGNOSIS — L578 Other skin changes due to chronic exposure to nonionizing radiation: Secondary | ICD-10-CM | POA: Diagnosis not present

## 2021-02-26 DIAGNOSIS — L821 Other seborrheic keratosis: Secondary | ICD-10-CM | POA: Diagnosis not present

## 2021-02-26 DIAGNOSIS — L570 Actinic keratosis: Secondary | ICD-10-CM | POA: Diagnosis not present

## 2021-03-27 DIAGNOSIS — K219 Gastro-esophageal reflux disease without esophagitis: Secondary | ICD-10-CM | POA: Diagnosis not present

## 2021-03-27 DIAGNOSIS — R5383 Other fatigue: Secondary | ICD-10-CM | POA: Diagnosis not present

## 2021-03-27 DIAGNOSIS — I110 Hypertensive heart disease with heart failure: Secondary | ICD-10-CM | POA: Diagnosis not present

## 2021-03-27 DIAGNOSIS — Z86718 Personal history of other venous thrombosis and embolism: Secondary | ICD-10-CM | POA: Diagnosis not present

## 2021-03-27 DIAGNOSIS — R7989 Other specified abnormal findings of blood chemistry: Secondary | ICD-10-CM | POA: Diagnosis not present

## 2021-03-27 DIAGNOSIS — I5032 Chronic diastolic (congestive) heart failure: Secondary | ICD-10-CM | POA: Diagnosis not present

## 2021-03-27 DIAGNOSIS — R7303 Prediabetes: Secondary | ICD-10-CM | POA: Diagnosis not present

## 2021-03-27 DIAGNOSIS — R110 Nausea: Secondary | ICD-10-CM | POA: Diagnosis not present

## 2021-03-27 DIAGNOSIS — E785 Hyperlipidemia, unspecified: Secondary | ICD-10-CM | POA: Diagnosis not present

## 2021-03-27 DIAGNOSIS — R0789 Other chest pain: Secondary | ICD-10-CM | POA: Diagnosis not present

## 2021-03-27 DIAGNOSIS — R079 Chest pain, unspecified: Secondary | ICD-10-CM | POA: Diagnosis not present

## 2021-03-27 DIAGNOSIS — R531 Weakness: Secondary | ICD-10-CM | POA: Diagnosis not present

## 2021-03-27 DIAGNOSIS — I517 Cardiomegaly: Secondary | ICD-10-CM | POA: Diagnosis not present

## 2021-03-28 DIAGNOSIS — R079 Chest pain, unspecified: Secondary | ICD-10-CM | POA: Diagnosis not present

## 2021-03-28 DIAGNOSIS — K219 Gastro-esophageal reflux disease without esophagitis: Secondary | ICD-10-CM | POA: Diagnosis not present

## 2021-03-28 DIAGNOSIS — R5383 Other fatigue: Secondary | ICD-10-CM | POA: Diagnosis not present

## 2021-03-28 DIAGNOSIS — I517 Cardiomegaly: Secondary | ICD-10-CM | POA: Diagnosis not present

## 2021-04-05 DIAGNOSIS — K219 Gastro-esophageal reflux disease without esophagitis: Secondary | ICD-10-CM | POA: Diagnosis not present

## 2021-04-05 DIAGNOSIS — R0609 Other forms of dyspnea: Secondary | ICD-10-CM | POA: Diagnosis not present

## 2021-04-05 DIAGNOSIS — I209 Angina pectoris, unspecified: Secondary | ICD-10-CM | POA: Diagnosis not present

## 2021-04-05 DIAGNOSIS — Z6829 Body mass index (BMI) 29.0-29.9, adult: Secondary | ICD-10-CM | POA: Diagnosis not present

## 2021-04-05 DIAGNOSIS — Z7689 Persons encountering health services in other specified circumstances: Secondary | ICD-10-CM | POA: Diagnosis not present

## 2021-04-23 ENCOUNTER — Encounter: Payer: Self-pay | Admitting: *Deleted

## 2021-04-23 ENCOUNTER — Encounter: Payer: Self-pay | Admitting: Cardiology

## 2021-05-08 DIAGNOSIS — Z01419 Encounter for gynecological examination (general) (routine) without abnormal findings: Secondary | ICD-10-CM | POA: Diagnosis not present

## 2021-05-08 DIAGNOSIS — N3946 Mixed incontinence: Secondary | ICD-10-CM | POA: Diagnosis not present

## 2021-05-08 DIAGNOSIS — Z1231 Encounter for screening mammogram for malignant neoplasm of breast: Secondary | ICD-10-CM | POA: Diagnosis not present

## 2021-05-08 DIAGNOSIS — N952 Postmenopausal atrophic vaginitis: Secondary | ICD-10-CM | POA: Diagnosis not present

## 2021-05-08 DIAGNOSIS — Z6830 Body mass index (BMI) 30.0-30.9, adult: Secondary | ICD-10-CM | POA: Diagnosis not present

## 2021-05-24 ENCOUNTER — Ambulatory Visit: Payer: Medicare PPO | Admitting: Cardiology

## 2021-05-24 VITALS — BP 138/80 | HR 68 | Ht 66.0 in | Wt 187.4 lb

## 2021-05-24 DIAGNOSIS — R079 Chest pain, unspecified: Secondary | ICD-10-CM | POA: Diagnosis not present

## 2021-05-24 DIAGNOSIS — Z9889 Other specified postprocedural states: Secondary | ICD-10-CM

## 2021-05-24 DIAGNOSIS — K219 Gastro-esophageal reflux disease without esophagitis: Secondary | ICD-10-CM | POA: Insufficient documentation

## 2021-05-24 DIAGNOSIS — R7989 Other specified abnormal findings of blood chemistry: Secondary | ICD-10-CM

## 2021-05-24 DIAGNOSIS — E782 Mixed hyperlipidemia: Secondary | ICD-10-CM

## 2021-05-24 DIAGNOSIS — C801 Malignant (primary) neoplasm, unspecified: Secondary | ICD-10-CM | POA: Insufficient documentation

## 2021-05-24 DIAGNOSIS — I10 Essential (primary) hypertension: Secondary | ICD-10-CM | POA: Diagnosis not present

## 2021-05-24 HISTORY — DX: Other specified postprocedural states: Z98.890

## 2021-05-24 MED ORDER — NITROGLYCERIN 0.4 MG SL SUBL: 0.4000 mg | SUBLINGUAL_TABLET | SUBLINGUAL | 3 refills | Status: DC | PRN

## 2021-05-24 MED ORDER — METOPROLOL TARTRATE 100 MG PO TABS: 100.0000 mg | ORAL_TABLET | Freq: Once | ORAL | 0 refills | Status: DC

## 2021-05-24 NOTE — Patient Instructions (Signed)
Medication Instructions:  Your physician has recommended you make the following change in your medication:   START: Nitroglycerin 0.4 mg under the tongue every 5 minutes as needed for chest pain.  *If you need a refill on your cardiac medications before your next appointment, please call your pharmacy*   Lab Work:  BMP 1 week before CT  If you have labs (blood work) drawn today and your tests are completely normal, you will receive your results only by: [place] (if you have MyChart) OR A paper copy in the mail If you have any lab test that is abnormal or we need to change your treatment, we will call you to review the results.   Testing/Procedures:   Your cardiac CT will be scheduled at one of the below locations:   [facility] [address] [place], [place] [address] [phone]  [facility] [address] [place], [place] [address] [phone]  If scheduled at [facility], please arrive at the [facility] and Children's Entrance (Entrance C2) of [facility] 30 minutes prior to test start time. You can use the FREE valet parking offered at entrance C (encouraged to control the heart rate for the test)  Proceed to the [facility] Radiology Department (first floor) to check-in and test prep.  All radiology patients and guests should use entrance C2 at [facility], accessed from [facility], even though the hospital's physical address listed is [address]    If scheduled at [facility], please arrive 15 mins early for check-in and test prep.  Please follow these instructions carefully (unless otherwise directed):  On the Night Before the Test: Be sure to Drink plenty of water. Do not consume any caffeinated/decaffeinated beverages or chocolate 12 hours prior to your test. Do not take any antihistamines 12 hours prior to your  test.  On the Day of the Test: Drink plenty of water until 1 hour prior to the test. Do not eat any food 4 hours prior to the test. You may take your regular medications prior to the test.  Take metoprolol (Lopressor) two hours prior to test. HOLD Dyazide FEMALES- please wear underwire-free bra if available, avoid dresses & tight clothing       After the Test: Drink plenty of water. After receiving IV contrast, you may experience a mild flushed feeling. This is normal. On occasion, you may experience a mild rash up to 24 hours after the test. This is not dangerous. If this occurs, you can take Benadryl 25 mg and increase your fluid intake. If you experience trouble breathing, this can be serious. If it is severe call 911 IMMEDIATELY. If it is mild, please call our office. If you take any of these medications: Glipizide/Metformin, Avandament, Glucavance, please do not take 48 hours after completing test unless otherwise instructed.  We will call to schedule your test 2-4 weeks out understanding that some insurance companies will need an authorization prior to the service being performed.   For non-scheduling related questions, please contact the cardiac imaging nurse navigator should you have any questions/concerns: Marchia Bond, Cardiac Imaging Nurse Navigator Gordy Clement, Cardiac Imaging Nurse Navigator [facility] Direct Office Dial: [phone]   For scheduling needs, including cancellations and rescheduling, please call [place], [phone].    Follow-Up: At [facility], you and your health needs are our priority.  As part of our continuing mission  to provide you with exceptional heart care, we have created designated Provider Care Teams.  These Care Teams include your primary Cardiologist (physician) and Advanced Practice Providers (APPs -  Physician Assistants and Nurse Practitioners) who all work together to provide you with the care you need,  when you need it.  We recommend signing up for the patient portal called "MyChart".  Sign up information is provided on this After Visit Summary.  MyChart is used to connect with patients for Virtual Visits (Telemedicine).  Patients are able to view lab/test results, encounter notes, upcoming appointments, etc.  Non-urgent messages can be sent to your provider as well.   To learn more about what you can do with MyChart, go to [email or web address].    Your next appointment:   6 week(s)  The format for your next appointment:   In Person  Provider:   Shirlee More, MD    Other Instructions None  Important Information About [place]

## 2021-05-24 NOTE — Progress Notes (Signed)
Cardiology Office Note:    Date:  05/24/2021   ID:  Stacy Clayton, DOB 1950-04-01, MRN [id number]  PCP:  Stacy Grammes, MD : Cardiologist:  Stacy More, MD   Referring MD: Stacy Grammes, MD  ASSESSMENT:    1. Chest pain of uncertain etiology   2. Elevated brain natriuretic peptide (BNP) level   3. Benign hypertension   4. Mixed hyperlipidemia   5. Chest pain, unspecified type    PLAN:    In order of problems listed above:  Her chest pain is quite concerning for coronary ischemia especially the radiation through to her jaw and failure to relieved with antiacid she had a myocardial perfusion study performed pharmacologically I am just not sure of the sensitivity of this test for chronic chest pain syndrome onset of acute coronary syndrome.  I reviewed with her I think she is best served by having a cardiac CTA for coronary calcium score and to define the presence or absence of CAD.  In the interim she will continue her current medications including beta-blocker statin and low-dose aspirin also gave her prescription for nitroglycerin she can use if she has a recurrent severe episode I will see her back in the office 6 to 8 weeks The utility of a BMP level in the emergency room is in a patient with shortness of breath with uncertain etiology after having routine testing and flow excludes the diagnosis of CAD.  Obviously she does not have cardiomyopathy or heart failure and subsequent BNP level was low and I think this is just a lab error. Stable continue current treatment for hypertension Continue her statin if her calcium score is 0 she could reconsider statin usage  Next appointment 6 to 8 weeks   Medication Adjustments/Labs and Tests Ordered: Current medicines are reviewed at length with the patient today.  Concerns regarding medicines are outlined above.  Orders Placed This Encounter  Procedures   CT CORONARY MORPH W/CTA COR W/SCORE W/CA W/CM &/OR WO/CM   EKG 12-Lead   Meds  ordered this encounter  Medications   nitroGLYCERIN (NITROSTAT) 0.4 MG SL tablet    Sig: Place 1 tablet (0.4 mg total) under the tongue every 5 (five) minutes as needed for chest pain.    Dispense:  90 tablet    Refill:  3     Chief Complaint  Patient presents with   Chest Pain    History of Present Illness:    Stacy Clayton is a 71 y.o. female with a history of hypertension right lower extremity DVT and breast cancer who is being seen today for the evaluation of chest pain after recent Stacy Clayton admission in March At the request of Stacy Grammes, MD.  [facility] records show admission with chest pain serial troponin normal EKG personally reviewed nonspecific T waves echocardiogram showed EF 55 to 60% grade 1 diastolic dysfunction mild aortic valve regurgitation.  N-terminal proBNP was elevated 2450 hemoglobin 13.7 creatinine 0.8 potassium 3.5.  She had a myocardial perfusion study performed I cannot review the images but the report is that the EKG component showed no findings of ischemia normal perfusion and normal function. She did have a CTA of the chest showing no findings of aortopathy coronary atherosclerosis or pulmonary embolism.  Because of the elevated N-terminal proBNP a repeat in her physician's office BNP was quite low at 57.  She has a history of chronic chest pain The episodes are always at rest or nocturnal not frequent usually not severe persistent characterizes  substernal discomfort that radiates to the back and quickly relieved with over-the-counter antiacid 2 days prior to admission she had a very severe persistent episode lasted 90 minutes and this time radiated up to her jaw and she said her teeth and actually approach she was unrelieved with Tums Next few days she did not feel well she checked her blood pressure down a number in the range of 80/60 went to urgent care When she said chest pain they sent her to the ED She has had no recurrence She does  vigorous gardening activities and has no exertional chest pain or shortness of breath She has no history of heart disease congenital rheumatic or heart murmur She had a lumpectomy done but tells me it was not cancer and did not have radiation or chemotherapy She has had no recurrence of her deep vein thrombosis She has had no chest wall trauma She has no shortness of breath Her chest pain is not pleuritic it was severe in nature best described as pressure and again radiated to her jaw Past Medical History:  Diagnosis Date   Benign hypertension 02/29/2020   Cancer [facility])    DVT, lower extremity, distal, acute, right ([place]) 12/12/2020   GERD without esophagitis    Migraine 01/25/2019   PONV (postoperative nausea and vomiting)    PONV (postoperative nausea and vomiting) 05/24/2021    Past Surgical History:  Procedure Laterality Date   BREAST BIOPSY Left    BREAST LUMPECTOMY WITH RADIOACTIVE SEED LOCALIZATION Left 09/30/2018   Procedure: LEFT BREAST LUMPECTOMY WITH RADIOACTIVE SEED LOCALIZATION;  Surgeon: Erroll Luna, MD;  Location: [place];  Service: General;  Laterality: Left;   CESAREAN SECTION     CHOLECYSTECTOMY  12/2007   ESOPHAGOGASTRODUODENOSCOPY (EGD) WITH PROPOFOL N/A 12/23/2019   Procedure: ESOPHAGOGASTRODUODENOSCOPY (EGD) WITH PROPOFOL;  Surgeon: Carol Ada, MD;  Location: [facility];  Service: Endoscopy;  Laterality: N/A;   ESOPHAGOGASTRODUODENOSCOPY (EGD) WITH PROPOFOL N/A 08/17/2020   Procedure: ESOPHAGOGASTRODUODENOSCOPY (EGD) WITH PROPOFOL;  Surgeon: Carol Ada, MD;  Location: [facility];  Service: Endoscopy;  Laterality: N/A;   INCISION AND DRAINAGE  12/12/2011   Procedure: INCISION AND DRAINAGE;  Surgeon: Tennis Must, MD;  Location: [facility];  Service: Orthopedics;  Laterality: Left;  Incision and drainage of open left thumb distal phalanax fracture and nail bed injury   NAILBED REPAIR  12/12/2011   Procedure: NAILBED REPAIR;  Surgeon: Tennis Must, MD;  Location: [facility];  Service: Orthopedics;  Laterality: Left;   PERCUTANEOUS PINNING  12/12/2011   Procedure: PERCUTANEOUS PINNING EXTREMITY;  Surgeon: Tennis Must, MD;  Location: [facility];  Service: Orthopedics;  Laterality: Left;  Pinning of distal phalanax fracture of left thumb   SKIN CANCER EXCISION     TONSILLECTOMY     UPPER ESOPHAGEAL ENDOSCOPIC ULTRASOUND (EUS) N/A 12/23/2019   Procedure: UPPER ESOPHAGEAL ENDOSCOPIC ULTRASOUND (EUS);  Surgeon: Carol Ada, MD;  Location: [facility];  Service: Endoscopy;  Laterality: N/A;   UPPER ESOPHAGEAL ENDOSCOPIC ULTRASOUND (EUS) N/A 08/17/2020   Procedure: UPPER ESOPHAGEAL ENDOSCOPIC ULTRASOUND (EUS);  Surgeon: Carol Ada, MD;  Location: [facility];  Service: Endoscopy;  Laterality: N/A;    Current Medications: Current Meds  Medication Sig   aspirin EC 81 MG tablet Take 81 mg by mouth daily. Swallow whole.   atorvastatin (LIPITOR) 20 MG tablet Take 20 mg by mouth daily.   CALCIUM PO Take 1 tablet by mouth daily.   FIBER PO Take 1 tablet by mouth daily.  metFORMIN (GLUCOPHAGE-XR) 500 MG 24 hr tablet Take 500 mg by mouth at bedtime.   metoprolol tartrate (LOPRESSOR) 25 MG tablet Take 12.5 mg by mouth in the morning and at bedtime.   nitroGLYCERIN (NITROSTAT) 0.4 MG SL tablet Place 1 tablet (0.4 mg total) under the tongue every 5 (five) minutes as needed for chest pain.   omeprazole (PRILOSEC) 40 MG capsule Take 40 mg by mouth daily before breakfast.   SUMAtriptan (IMITREX) 50 MG tablet Take 50 mg by mouth every 2 (two) hours as needed for migraine.   triamterene-hydrochlorothiazide (DYAZIDE) 37.5-25 MG capsule Take 1 capsule by mouth in the morning.     Allergies:   Patient has no known allergies.   Social History   Socioeconomic History   Marital status: Married    Spouse name: Not on file   Number of children: Not on file   Years of education: Not on file   Highest education level: Not on file  Occupational  History   Not on file  Tobacco Use   Smoking status: Never    Passive exposure: Past   Smokeless tobacco: Never  Vaping Use   Vaping Use: Never used  Substance and Sexual Activity   Alcohol use: No   Drug use: Never   Sexual activity: Not on file  Other Topics Concern   Not on file  Social History Narrative   Not on file   Social Determinants of Health   Financial Resource Strain: Not on file  Food Insecurity: Not on file  Transportation Needs: Not on file  Physical Activity: Not on file  Stress: Not on file  Social Connections: Not on file     Family History: The patient's family history includes Breast cancer in her cousin and maternal aunt; Cancer in her mother; Diabetes in her mother; Hypertension in her mother.  ROS:   ROS Please see the history of present illness.     All other systems reviewed and are negative.  EKGs/Labs/Other Studies Reviewed:    The following studies were reviewed today:   EKG:  EKG is  ordered today.  The ekg ordered today is personally reviewed and demonstrates left axis deviation otherwise normal  Recent Labs: 12/07/2020: Hemoglobin 15.0; Platelets 212  Recent Lipid Panel 12/19/2019 cholesterol 147 triglycerides 97 A1c 6.1%  Physical Exam:    VS:  BP 138/80 (BP Location: Right Arm, Patient Position: Sitting)   Pulse 68   Ht '5\' 6"'$  (1.676 m)   Wt 187 lb 6.4 oz (85 kg)   SpO2 97%   BMI 30.25 kg/m     Wt Readings from Last 3 Encounters:  05/24/21 187 lb 6.4 oz (85 kg)  04/05/21 184 lb (83.5 kg)  12/07/20 185 lb 14.4 oz (84.3 kg)     GEN: Appears her age well nourished, well developed in no acute distress HEENT: Normal NECK: No JVD; No carotid bruits LYMPHATICS: No lymphadenopathy CARDIAC: RRR, no murmurs, rubs, gallops RESPIRATORY:  Clear to auscultation without rales, wheezing or rhonchi  ABDOMEN: Soft, non-tender, non-distended MUSCULOSKELETAL:  No edema; No deformity  SKIN: Warm and dry NEUROLOGIC:  Alert and oriented  x 3 PSYCHIATRIC:  Normal affect     Signed, Stacy More, MD  05/24/2021 8:58 AM    [place]

## 2021-05-28 DIAGNOSIS — Z6829 Body mass index (BMI) 29.0-29.9, adult: Secondary | ICD-10-CM | POA: Diagnosis not present

## 2021-05-28 DIAGNOSIS — E1142 Type 2 diabetes mellitus with diabetic polyneuropathy: Secondary | ICD-10-CM | POA: Diagnosis not present

## 2021-05-28 DIAGNOSIS — I209 Angina pectoris, unspecified: Secondary | ICD-10-CM | POA: Diagnosis not present

## 2021-05-28 DIAGNOSIS — I10 Essential (primary) hypertension: Secondary | ICD-10-CM | POA: Diagnosis not present

## 2021-05-28 DIAGNOSIS — H6983 Other specified disorders of Eustachian tube, bilateral: Secondary | ICD-10-CM | POA: Diagnosis not present

## 2021-05-29 DIAGNOSIS — D3501 Benign neoplasm of right adrenal gland: Secondary | ICD-10-CM | POA: Diagnosis not present

## 2021-05-31 DIAGNOSIS — D3501 Benign neoplasm of right adrenal gland: Secondary | ICD-10-CM | POA: Diagnosis not present

## 2021-05-31 DIAGNOSIS — N281 Cyst of kidney, acquired: Secondary | ICD-10-CM | POA: Diagnosis not present

## 2021-05-31 DIAGNOSIS — K760 Fatty (change of) liver, not elsewhere classified: Secondary | ICD-10-CM | POA: Diagnosis not present

## 2021-06-04 DIAGNOSIS — D3501 Benign neoplasm of right adrenal gland: Secondary | ICD-10-CM | POA: Diagnosis not present

## 2021-06-04 DIAGNOSIS — M545 Low back pain, unspecified: Secondary | ICD-10-CM | POA: Diagnosis not present

## 2021-06-07 ENCOUNTER — Other Ambulatory Visit: Payer: Self-pay

## 2021-06-07 DIAGNOSIS — E782 Mixed hyperlipidemia: Secondary | ICD-10-CM | POA: Diagnosis not present

## 2021-06-07 DIAGNOSIS — R7989 Other specified abnormal findings of blood chemistry: Secondary | ICD-10-CM | POA: Diagnosis not present

## 2021-06-07 DIAGNOSIS — R079 Chest pain, unspecified: Secondary | ICD-10-CM | POA: Diagnosis not present

## 2021-06-07 DIAGNOSIS — I10 Essential (primary) hypertension: Secondary | ICD-10-CM | POA: Diagnosis not present

## 2021-06-07 MED ORDER — NITROGLYCERIN 0.4 MG SL SUBL: 0.4000 mg | SUBLINGUAL_TABLET | SUBLINGUAL | 6 refills | Status: DC | PRN

## 2021-06-08 LAB — BASIC METABOLIC PANEL
BUN/Creatinine Ratio: 15 (ref 12–28)
BUN: 14 mg/dL (ref 8–27)
CO2: 24 mmol/L (ref 20–29)
Calcium: 9.4 mg/dL (ref 8.7–10.3)
Chloride: 107 mmol/L — ABNORMAL HIGH (ref 96–106)
Creatinine, Ser: 0.94 mg/dL (ref 0.57–1.00)
Glucose: 104 mg/dL — ABNORMAL HIGH (ref 70–99)
Potassium: 3.9 mmol/L (ref 3.5–5.2)
Sodium: 145 mmol/L — ABNORMAL HIGH (ref 134–144)
eGFR: 65 mL/min/{1.73_m2} (ref >59)

## 2021-06-12 ENCOUNTER — Telehealth: Payer: Self-pay | Admitting: Cardiology

## 2021-06-12 NOTE — Telephone Encounter (Signed)
Informed patient of lab results as per Dr Bettina Gavia. Patient voices understanding and thanked me for the call.

## 2021-06-12 NOTE — Telephone Encounter (Signed)
Patient returned call for lab results.  

## 2021-06-13 ENCOUNTER — Telehealth (HOSPITAL_COMMUNITY): Payer: Self-pay | Admitting: *Deleted

## 2021-06-13 NOTE — Telephone Encounter (Signed)
Reaching out to patient to offer assistance regarding upcoming cardiac imaging study; pt verbalizes understanding of appt date/time, parking situation and where to check in, pre-test NPO status and medications ordered, and verified current allergies; name and call back number provided for further questions should they arise  Akaiya Touchette RN Navigator Cardiac Imaging [facility] [phone] office [phone] cell  Patient to take 25mg metoprolol tartrate two hours prior to her cardiac CT scan. She is aware to arrive at 11:30am. 

## 2021-06-13 NOTE — Telephone Encounter (Signed)
Attempted to call patient regarding upcoming cardiac CT appointment. °Left message on voicemail with name and callback number ° °Breda Bond RN Navigator Cardiac Imaging °[facility] °[phone] Office °[phone] Cell ° °

## 2021-06-14 ENCOUNTER — Ambulatory Visit (HOSPITAL_COMMUNITY)
Admission: RE | Admit: 2021-06-14 | Discharge: 2021-06-14 | Disposition: A | Discharge status: Home / Self Care | Payer: Medicare PPO | Source: Ambulatory Visit | Attending: Cardiology | Admitting: Cardiology

## 2021-06-14 DIAGNOSIS — R072 Precordial pain: Secondary | ICD-10-CM | POA: Insufficient documentation

## 2021-06-14 DIAGNOSIS — R590 Localized enlarged lymph nodes: Secondary | ICD-10-CM | POA: Insufficient documentation

## 2021-06-14 DIAGNOSIS — R079 Chest pain, unspecified: Secondary | ICD-10-CM | POA: Insufficient documentation

## 2021-06-14 IMAGING — CT CT HEART MORP W/ CTA COR W/ SCORE W/ CA W/CM &/OR W/O CM
4 of 7 series · 8 of 20 positions shown, 9 images · non-contrast
Comparison: [DATE] chest CT

Addendum:
CLINICAL DATA: Chest pain

EXAM:
Cardiac CTA
MEDICATIONS:
Sub lingual nitro. 4mg and lopressor 25mg
TECHNIQUE: The patient was scanned on a Siemens Force [REDACTED]ice scanner. Gantry
rotation speed was 250 msecs. Collimation was .6 mm. A 100 kV
prospective scan was triggered in the ascending thoracic aorta at
140 HU's Full mA was used between 35% and 75% of the R-R interval.
Average HR during the scan was 65 bpm. The 3D data set was
interpreted on a dedicated work station using MPR, MIP and VRT
modes. A total of 80cc of contrast was used.

[Series 6: best syst · axial · 0.39mm/px · z∈[+1249,+1288]mm · 2 of 288 slices shown, 3 images]
[im 96/288  vessel]
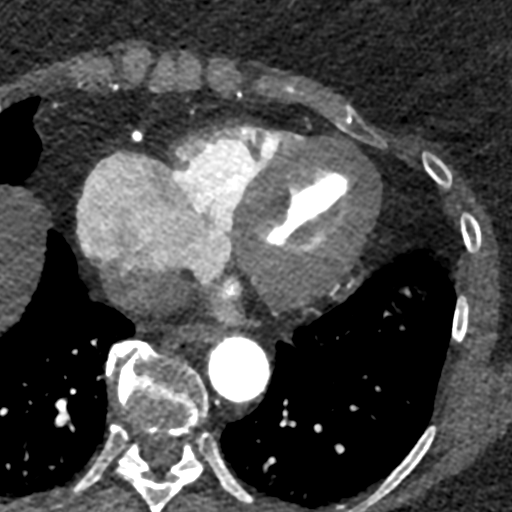
[im 96/288  lung]
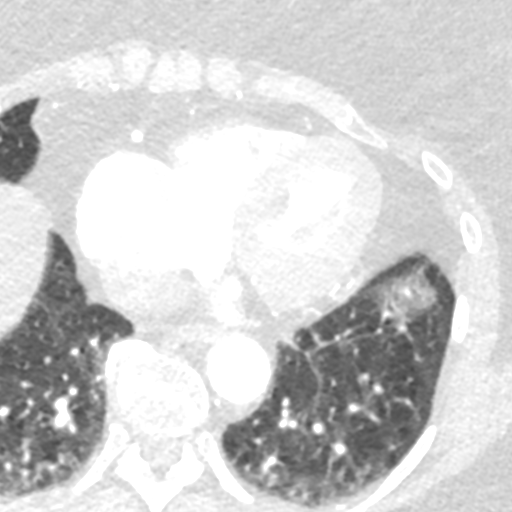
[im 192/288  vessel]
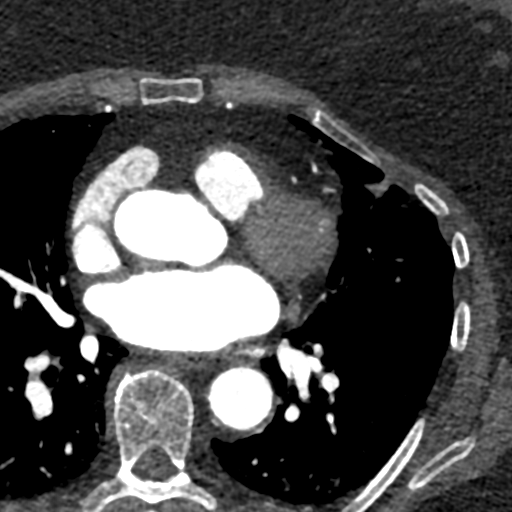

[Series 7: ts syst sharp · axial · 0.39mm/px · z∈[+1249,+1288]mm · 2 of 288 slices shown]
[im 96/288  lung]
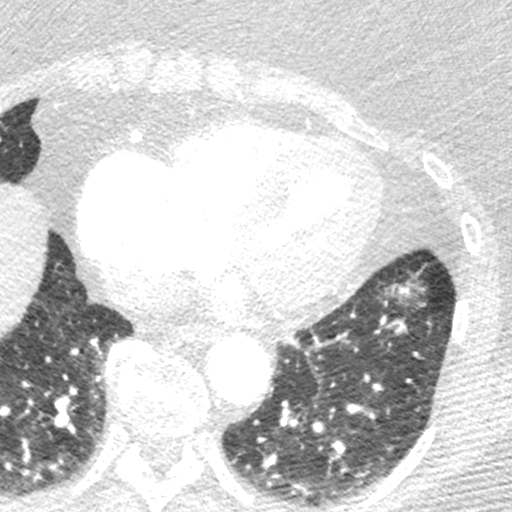
[im 192/288  lung]
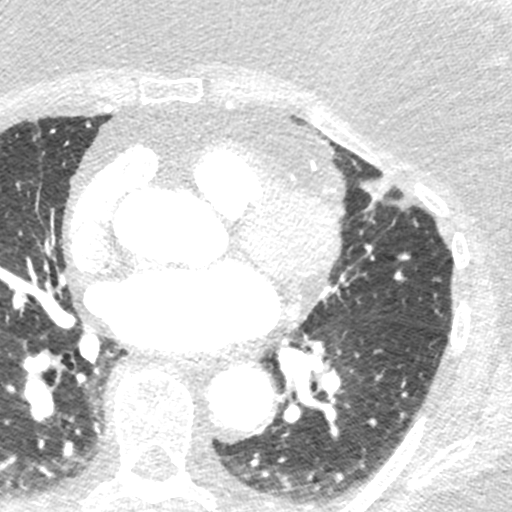

[Series 8: best diast · axial · 0.39mm/px · z∈[+1249,+1288]mm · 2 of 288 slices shown]
[im 96/288  vessel]
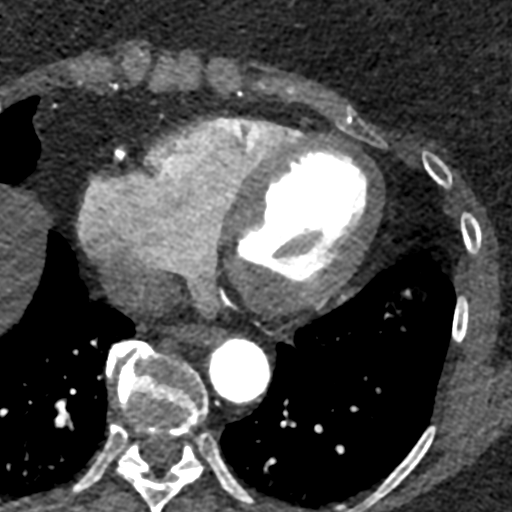
[im 192/288  vessel]
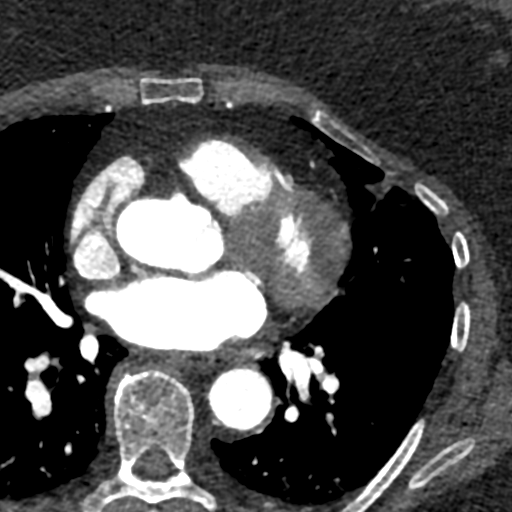

[Series 9: ts diast sharp · axial · 0.39mm/px · z∈[+1249,+1288]mm · 2 of 288 slices shown]
[im 96/288  lung]
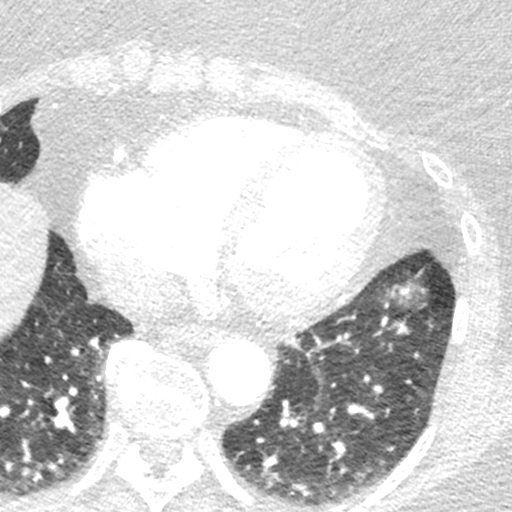
[im 192/288  lung]
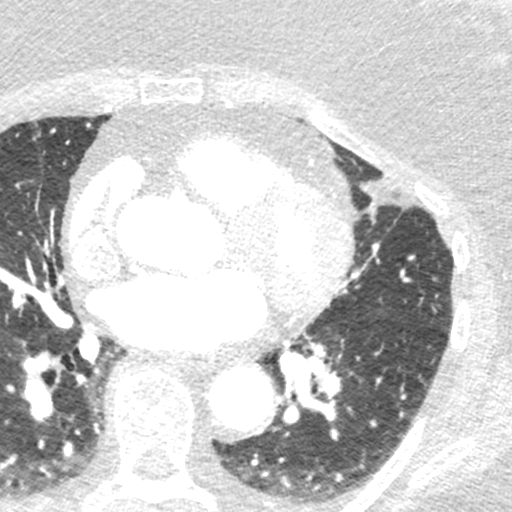

[8 of 20 positions shown; findings below may reference images not displayed]

FINDINGS: Non-cardiac: See separate report from [REDACTED]. No
significant findings on limited lung and soft tissue windows.

Calcium Score: No calcium noted

Coronary Arteries: Right dominant with no anomalies

LM: Normal

LAD: Normal

D1: Normal

D2: Normal

Circumflex: Normal

OM1: Normal

OM2: Normal

RCA: Normal

PDA: Normal

PLA: Normal
IMPRESSION: 1. Calcium score 0

2.  Normal ascending thoracic aorta 3.2 cm

3.  Normal right dominant coronary arteries

FERIENHAUS

EXAM:
OVER-READ INTERPRETATION CARDIAC CT CHEST

The following report is an over-read performed by radiologist Dr.
FERIENHAUS [REDACTED] on [DATE]. This
over-read does not include interpretation of cardiac or coronary
anatomy or pathology. The coronary CTA interpretation by the
cardiologist is attached.
FINDINGS: Extracardiac Vascular: Unremarkable

Mediastinum: 1.2 cm in short axis right hilar lymph node on image 3
series 10, formerly the same on [DATE]. Right infrahilar node
0.9 cm in short axis on image 10 series 10, previously the same.

Lung: Unremarkable

Included Upper Abdomen: Suspected hepatic steatosis.

Musculoskeletal: Thoracic spondylosis. Broad Schmorl's node or
chronic mild superior endplate compression at T11, unchanged.
IMPRESSION: 1. Stable mild right hilar adenopathy, cause uncertain.
2. Suspected hepatic steatosis.

*** End of Addendum ***
FINDINGS: Non-cardiac: See separate report from [REDACTED]. No
significant findings on limited lung and soft tissue windows.

Calcium Score: No calcium noted

Coronary Arteries: Right dominant with no anomalies

LM: Normal

LAD: Normal

D1: Normal

D2: Normal

Circumflex: Normal

OM1: Normal

OM2: Normal

RCA: Normal

PDA: Normal

PLA: Normal
IMPRESSION: 1. Calcium score 0

2.  Normal ascending thoracic aorta 3.2 cm

3.  Normal right dominant coronary arteries

FERIENHAUS

## 2021-06-14 MED ORDER — NITROGLYCERIN 0.4 MG SL SUBL
0.8000 mg | SUBLINGUAL_TABLET | Freq: Once | SUBLINGUAL | Status: AC
  Administered 2021-06-14: 0.8 mg via SUBLINGUAL

## 2021-06-14 MED ORDER — METOPROLOL TARTRATE 5 MG/5ML IV SOLN
INTRAVENOUS | Status: AC
  Filled 2021-06-14: qty 10

## 2021-06-14 MED ORDER — NITROGLYCERIN 0.4 MG SL SUBL
SUBLINGUAL_TABLET | SUBLINGUAL | Status: AC
  Filled 2021-06-14: qty 2

## 2021-06-14 MED ORDER — IOHEXOL 350 MG/ML SOLN: 100.0000 mL | Freq: Once | INTRAVENOUS | Status: DC | PRN

## 2021-06-14 MED ORDER — METOPROLOL TARTRATE 5 MG/5ML IV SOLN
5.0000 mg | INTRAVENOUS | Status: DC | PRN
  Administered 2021-06-14: 5 mg via INTRAVENOUS

## 2021-06-14 MED ORDER — IOHEXOL 300 MG/ML  SOLN
100.0000 mL | Freq: Once | INTRAMUSCULAR | Status: AC | PRN
  Administered 2021-06-14: 100 mL via INTRAVENOUS

## 2021-07-15 ENCOUNTER — Ambulatory Visit: Payer: Medicare PPO | Admitting: Cardiology

## 2021-07-24 DIAGNOSIS — I25119 Atherosclerotic heart disease of native coronary artery with unspecified angina pectoris: Secondary | ICD-10-CM | POA: Diagnosis not present

## 2021-07-24 DIAGNOSIS — K219 Gastro-esophageal reflux disease without esophagitis: Secondary | ICD-10-CM | POA: Diagnosis not present

## 2021-07-24 DIAGNOSIS — I110 Hypertensive heart disease with heart failure: Secondary | ICD-10-CM | POA: Diagnosis not present

## 2021-07-24 DIAGNOSIS — E669 Obesity, unspecified: Secondary | ICD-10-CM | POA: Diagnosis not present

## 2021-07-24 DIAGNOSIS — G4700 Insomnia, unspecified: Secondary | ICD-10-CM | POA: Diagnosis not present

## 2021-07-24 DIAGNOSIS — E119 Type 2 diabetes mellitus without complications: Secondary | ICD-10-CM | POA: Diagnosis not present

## 2021-07-24 DIAGNOSIS — E785 Hyperlipidemia, unspecified: Secondary | ICD-10-CM | POA: Diagnosis not present

## 2021-07-24 DIAGNOSIS — E261 Secondary hyperaldosteronism: Secondary | ICD-10-CM | POA: Diagnosis not present

## 2021-07-24 DIAGNOSIS — I509 Heart failure, unspecified: Secondary | ICD-10-CM | POA: Diagnosis not present

## 2021-08-20 DIAGNOSIS — Z6828 Body mass index (BMI) 28.0-28.9, adult: Secondary | ICD-10-CM | POA: Diagnosis not present

## 2021-08-20 DIAGNOSIS — E1142 Type 2 diabetes mellitus with diabetic polyneuropathy: Secondary | ICD-10-CM | POA: Diagnosis not present

## 2021-08-20 DIAGNOSIS — I209 Angina pectoris, unspecified: Secondary | ICD-10-CM | POA: Diagnosis not present

## 2021-08-20 DIAGNOSIS — F4321 Adjustment disorder with depressed mood: Secondary | ICD-10-CM | POA: Diagnosis not present

## 2021-09-13 DIAGNOSIS — F5102 Adjustment insomnia: Secondary | ICD-10-CM | POA: Diagnosis not present

## 2021-09-13 DIAGNOSIS — Z6828 Body mass index (BMI) 28.0-28.9, adult: Secondary | ICD-10-CM | POA: Diagnosis not present

## 2021-09-24 DIAGNOSIS — H524 Presbyopia: Secondary | ICD-10-CM | POA: Diagnosis not present

## 2021-09-24 DIAGNOSIS — Z961 Presence of intraocular lens: Secondary | ICD-10-CM | POA: Diagnosis not present

## 2021-10-07 DIAGNOSIS — D485 Neoplasm of uncertain behavior of skin: Secondary | ICD-10-CM | POA: Diagnosis not present

## 2021-10-08 DIAGNOSIS — Z23 Encounter for immunization: Secondary | ICD-10-CM | POA: Diagnosis not present

## 2021-10-08 DIAGNOSIS — F5102 Adjustment insomnia: Secondary | ICD-10-CM | POA: Diagnosis not present

## 2021-10-08 DIAGNOSIS — Z6827 Body mass index (BMI) 27.0-27.9, adult: Secondary | ICD-10-CM | POA: Diagnosis not present

## 2021-12-24 DIAGNOSIS — G43109 Migraine with aura, not intractable, without status migrainosus: Secondary | ICD-10-CM | POA: Diagnosis not present

## 2021-12-24 DIAGNOSIS — Z79899 Other long term (current) drug therapy: Secondary | ICD-10-CM | POA: Diagnosis not present

## 2021-12-24 DIAGNOSIS — Z0000 Encounter for general adult medical examination without abnormal findings: Secondary | ICD-10-CM | POA: Diagnosis not present

## 2021-12-24 DIAGNOSIS — Z1331 Encounter for screening for depression: Secondary | ICD-10-CM | POA: Diagnosis not present

## 2021-12-24 DIAGNOSIS — I10 Essential (primary) hypertension: Secondary | ICD-10-CM | POA: Diagnosis not present

## 2021-12-24 DIAGNOSIS — E1142 Type 2 diabetes mellitus with diabetic polyneuropathy: Secondary | ICD-10-CM | POA: Diagnosis not present

## 2021-12-24 DIAGNOSIS — Z6827 Body mass index (BMI) 27.0-27.9, adult: Secondary | ICD-10-CM | POA: Diagnosis not present

## 2021-12-24 DIAGNOSIS — Z13220 Encounter for screening for lipoid disorders: Secondary | ICD-10-CM | POA: Diagnosis not present

## 2021-12-24 DIAGNOSIS — I209 Angina pectoris, unspecified: Secondary | ICD-10-CM | POA: Diagnosis not present

## 2022-02-28 DIAGNOSIS — M5431 Sciatica, right side: Secondary | ICD-10-CM | POA: Diagnosis not present

## 2022-02-28 DIAGNOSIS — Z6828 Body mass index (BMI) 28.0-28.9, adult: Secondary | ICD-10-CM | POA: Diagnosis not present

## 2022-03-25 DIAGNOSIS — I10 Essential (primary) hypertension: Secondary | ICD-10-CM | POA: Diagnosis not present

## 2022-03-25 DIAGNOSIS — R7303 Prediabetes: Secondary | ICD-10-CM | POA: Diagnosis not present

## 2022-03-25 DIAGNOSIS — M5431 Sciatica, right side: Secondary | ICD-10-CM | POA: Diagnosis not present

## 2022-03-25 DIAGNOSIS — Z6828 Body mass index (BMI) 28.0-28.9, adult: Secondary | ICD-10-CM | POA: Diagnosis not present

## 2022-03-25 DIAGNOSIS — I209 Angina pectoris, unspecified: Secondary | ICD-10-CM | POA: Diagnosis not present

## 2022-03-31 DIAGNOSIS — L578 Other skin changes due to chronic exposure to nonionizing radiation: Secondary | ICD-10-CM | POA: Diagnosis not present

## 2022-03-31 DIAGNOSIS — L821 Other seborrheic keratosis: Secondary | ICD-10-CM | POA: Diagnosis not present

## 2022-03-31 DIAGNOSIS — Z85820 Personal history of malignant melanoma of skin: Secondary | ICD-10-CM | POA: Diagnosis not present

## 2022-04-25 DIAGNOSIS — D3501 Benign neoplasm of right adrenal gland: Secondary | ICD-10-CM | POA: Diagnosis not present

## 2022-05-06 DIAGNOSIS — D3501 Benign neoplasm of right adrenal gland: Secondary | ICD-10-CM | POA: Diagnosis not present

## 2022-05-06 DIAGNOSIS — K5730 Diverticulosis of large intestine without perforation or abscess without bleeding: Secondary | ICD-10-CM | POA: Diagnosis not present

## 2022-05-06 DIAGNOSIS — K760 Fatty (change of) liver, not elsewhere classified: Secondary | ICD-10-CM | POA: Diagnosis not present

## 2022-05-13 DIAGNOSIS — D3501 Benign neoplasm of right adrenal gland: Secondary | ICD-10-CM | POA: Diagnosis not present

## 2022-06-10 DIAGNOSIS — Z6829 Body mass index (BMI) 29.0-29.9, adult: Secondary | ICD-10-CM | POA: Diagnosis not present

## 2022-06-10 DIAGNOSIS — Z01419 Encounter for gynecological examination (general) (routine) without abnormal findings: Secondary | ICD-10-CM | POA: Diagnosis not present

## 2022-06-10 DIAGNOSIS — N952 Postmenopausal atrophic vaginitis: Secondary | ICD-10-CM | POA: Diagnosis not present

## 2022-06-10 DIAGNOSIS — Z1231 Encounter for screening mammogram for malignant neoplasm of breast: Secondary | ICD-10-CM | POA: Diagnosis not present

## 2022-09-25 DIAGNOSIS — Z6829 Body mass index (BMI) 29.0-29.9, adult: Secondary | ICD-10-CM | POA: Diagnosis not present

## 2022-09-25 DIAGNOSIS — R7303 Prediabetes: Secondary | ICD-10-CM | POA: Diagnosis not present

## 2022-09-25 DIAGNOSIS — I209 Angina pectoris, unspecified: Secondary | ICD-10-CM | POA: Diagnosis not present

## 2022-09-25 DIAGNOSIS — E1169 Type 2 diabetes mellitus with other specified complication: Secondary | ICD-10-CM | POA: Diagnosis not present

## 2022-09-25 DIAGNOSIS — E1159 Type 2 diabetes mellitus with other circulatory complications: Secondary | ICD-10-CM | POA: Diagnosis not present

## 2022-09-25 DIAGNOSIS — E1142 Type 2 diabetes mellitus with diabetic polyneuropathy: Secondary | ICD-10-CM | POA: Diagnosis not present

## 2022-09-25 DIAGNOSIS — M79605 Pain in left leg: Secondary | ICD-10-CM | POA: Diagnosis not present

## 2022-09-25 DIAGNOSIS — I152 Hypertension secondary to endocrine disorders: Secondary | ICD-10-CM | POA: Diagnosis not present

## 2022-09-25 DIAGNOSIS — E785 Hyperlipidemia, unspecified: Secondary | ICD-10-CM | POA: Diagnosis not present

## 2022-09-25 DIAGNOSIS — Z79899 Other long term (current) drug therapy: Secondary | ICD-10-CM | POA: Diagnosis not present

## 2022-11-16 DIAGNOSIS — R070 Pain in throat: Secondary | ICD-10-CM | POA: Diagnosis not present

## 2022-11-16 DIAGNOSIS — R051 Acute cough: Secondary | ICD-10-CM | POA: Diagnosis not present

## 2022-11-16 DIAGNOSIS — J209 Acute bronchitis, unspecified: Secondary | ICD-10-CM | POA: Diagnosis not present

## 2023-01-06 DIAGNOSIS — I152 Hypertension secondary to endocrine disorders: Secondary | ICD-10-CM | POA: Diagnosis not present

## 2023-01-06 DIAGNOSIS — Z0000 Encounter for general adult medical examination without abnormal findings: Secondary | ICD-10-CM | POA: Diagnosis not present

## 2023-01-06 DIAGNOSIS — E1159 Type 2 diabetes mellitus with other circulatory complications: Secondary | ICD-10-CM | POA: Diagnosis not present

## 2023-01-06 DIAGNOSIS — E785 Hyperlipidemia, unspecified: Secondary | ICD-10-CM | POA: Diagnosis not present

## 2023-01-06 DIAGNOSIS — I209 Angina pectoris, unspecified: Secondary | ICD-10-CM | POA: Diagnosis not present

## 2023-01-06 DIAGNOSIS — E1169 Type 2 diabetes mellitus with other specified complication: Secondary | ICD-10-CM | POA: Diagnosis not present

## 2023-01-06 DIAGNOSIS — E1142 Type 2 diabetes mellitus with diabetic polyneuropathy: Secondary | ICD-10-CM | POA: Diagnosis not present

## 2023-01-06 DIAGNOSIS — Z1339 Encounter for screening examination for other mental health and behavioral disorders: Secondary | ICD-10-CM | POA: Diagnosis not present

## 2023-01-06 DIAGNOSIS — F5102 Adjustment insomnia: Secondary | ICD-10-CM | POA: Diagnosis not present

## 2023-01-21 DIAGNOSIS — R933 Abnormal findings on diagnostic imaging of other parts of digestive tract: Secondary | ICD-10-CM | POA: Diagnosis not present

## 2023-03-03 DIAGNOSIS — H6990 Unspecified Eustachian tube disorder, unspecified ear: Secondary | ICD-10-CM | POA: Diagnosis not present

## 2023-03-03 DIAGNOSIS — R0982 Postnasal drip: Secondary | ICD-10-CM | POA: Diagnosis not present

## 2023-03-03 DIAGNOSIS — G4700 Insomnia, unspecified: Secondary | ICD-10-CM | POA: Diagnosis not present

## 2023-03-03 DIAGNOSIS — J309 Allergic rhinitis, unspecified: Secondary | ICD-10-CM | POA: Diagnosis not present

## 2023-04-06 DIAGNOSIS — E1169 Type 2 diabetes mellitus with other specified complication: Secondary | ICD-10-CM | POA: Diagnosis not present

## 2023-04-06 DIAGNOSIS — L578 Other skin changes due to chronic exposure to nonionizing radiation: Secondary | ICD-10-CM | POA: Diagnosis not present

## 2023-04-06 DIAGNOSIS — E785 Hyperlipidemia, unspecified: Secondary | ICD-10-CM | POA: Diagnosis not present

## 2023-04-06 DIAGNOSIS — I152 Hypertension secondary to endocrine disorders: Secondary | ICD-10-CM | POA: Diagnosis not present

## 2023-04-06 DIAGNOSIS — E1142 Type 2 diabetes mellitus with diabetic polyneuropathy: Secondary | ICD-10-CM | POA: Diagnosis not present

## 2023-04-06 DIAGNOSIS — Z85820 Personal history of malignant melanoma of skin: Secondary | ICD-10-CM | POA: Diagnosis not present

## 2023-04-06 DIAGNOSIS — F5104 Psychophysiologic insomnia: Secondary | ICD-10-CM | POA: Diagnosis not present

## 2023-04-06 DIAGNOSIS — L820 Inflamed seborrheic keratosis: Secondary | ICD-10-CM | POA: Diagnosis not present

## 2023-04-06 DIAGNOSIS — L821 Other seborrheic keratosis: Secondary | ICD-10-CM | POA: Diagnosis not present

## 2023-04-06 DIAGNOSIS — M25511 Pain in right shoulder: Secondary | ICD-10-CM | POA: Diagnosis not present

## 2023-04-06 DIAGNOSIS — Z6828 Body mass index (BMI) 28.0-28.9, adult: Secondary | ICD-10-CM | POA: Diagnosis not present

## 2023-04-06 DIAGNOSIS — E1159 Type 2 diabetes mellitus with other circulatory complications: Secondary | ICD-10-CM | POA: Diagnosis not present

## 2023-04-06 DIAGNOSIS — I209 Angina pectoris, unspecified: Secondary | ICD-10-CM | POA: Diagnosis not present

## 2023-04-08 DIAGNOSIS — M7501 Adhesive capsulitis of right shoulder: Secondary | ICD-10-CM | POA: Diagnosis not present

## 2023-04-08 DIAGNOSIS — M25511 Pain in right shoulder: Secondary | ICD-10-CM | POA: Diagnosis not present

## 2023-04-10 DIAGNOSIS — M7501 Adhesive capsulitis of right shoulder: Secondary | ICD-10-CM | POA: Diagnosis not present

## 2023-04-13 DIAGNOSIS — M7501 Adhesive capsulitis of right shoulder: Secondary | ICD-10-CM | POA: Diagnosis not present

## 2023-04-13 DIAGNOSIS — M25511 Pain in right shoulder: Secondary | ICD-10-CM | POA: Diagnosis not present

## 2023-04-14 DIAGNOSIS — R051 Acute cough: Secondary | ICD-10-CM | POA: Diagnosis not present

## 2023-04-14 DIAGNOSIS — R0981 Nasal congestion: Secondary | ICD-10-CM | POA: Diagnosis not present

## 2023-04-15 DIAGNOSIS — M25511 Pain in right shoulder: Secondary | ICD-10-CM | POA: Diagnosis not present

## 2023-04-15 DIAGNOSIS — M7501 Adhesive capsulitis of right shoulder: Secondary | ICD-10-CM | POA: Diagnosis not present

## 2023-04-21 DIAGNOSIS — Z6828 Body mass index (BMI) 28.0-28.9, adult: Secondary | ICD-10-CM | POA: Diagnosis not present

## 2023-04-21 DIAGNOSIS — S0993XA Unspecified injury of face, initial encounter: Secondary | ICD-10-CM | POA: Diagnosis not present

## 2023-04-21 DIAGNOSIS — J329 Chronic sinusitis, unspecified: Secondary | ICD-10-CM | POA: Diagnosis not present

## 2023-04-21 DIAGNOSIS — J40 Bronchitis, not specified as acute or chronic: Secondary | ICD-10-CM | POA: Diagnosis not present

## 2023-04-29 DIAGNOSIS — M7501 Adhesive capsulitis of right shoulder: Secondary | ICD-10-CM | POA: Diagnosis not present

## 2023-04-29 DIAGNOSIS — M25511 Pain in right shoulder: Secondary | ICD-10-CM | POA: Diagnosis not present

## 2023-05-01 ENCOUNTER — Ambulatory Visit (HOSPITAL_BASED_OUTPATIENT_CLINIC_OR_DEPARTMENT_OTHER)
Admit: 2023-05-01 | Discharge: 2023-05-01 | Disposition: A | Discharge status: Home / Self Care | Attending: Family Medicine | Admitting: Family Medicine

## 2023-05-01 ENCOUNTER — Encounter (HOSPITAL_BASED_OUTPATIENT_CLINIC_OR_DEPARTMENT_OTHER): Payer: Self-pay

## 2023-05-01 ENCOUNTER — Ambulatory Visit (HOSPITAL_BASED_OUTPATIENT_CLINIC_OR_DEPARTMENT_OTHER): Admitting: Radiology

## 2023-05-01 ENCOUNTER — Ambulatory Visit (HOSPITAL_BASED_OUTPATIENT_CLINIC_OR_DEPARTMENT_OTHER)
Admission: EM | Admit: 2023-05-01 | Discharge: 2023-05-01 | Disposition: A | Discharge status: Home / Self Care | Attending: Family Medicine | Admitting: Family Medicine

## 2023-05-01 DIAGNOSIS — R0781 Pleurodynia: Secondary | ICD-10-CM

## 2023-05-01 DIAGNOSIS — S299XXA Unspecified injury of thorax, initial encounter: Secondary | ICD-10-CM | POA: Diagnosis not present

## 2023-05-01 DIAGNOSIS — S62626A Displaced fracture of medial phalanx of right little finger, initial encounter for closed fracture: Secondary | ICD-10-CM | POA: Diagnosis not present

## 2023-05-01 DIAGNOSIS — S62326A Displaced fracture of shaft of fifth metacarpal bone, right hand, initial encounter for closed fracture: Secondary | ICD-10-CM | POA: Diagnosis not present

## 2023-05-01 DIAGNOSIS — W19XXXA Unspecified fall, initial encounter: Secondary | ICD-10-CM

## 2023-05-01 DIAGNOSIS — W01198A Fall on same level from slipping, tripping and stumbling with subsequent striking against other object, initial encounter: Secondary | ICD-10-CM

## 2023-05-01 DIAGNOSIS — M79641 Pain in right hand: Secondary | ICD-10-CM

## 2023-05-01 DIAGNOSIS — S62306A Unspecified fracture of fifth metacarpal bone, right hand, initial encounter for closed fracture: Secondary | ICD-10-CM | POA: Diagnosis not present

## 2023-05-01 NOTE — ED Triage Notes (Signed)
 Tripped on carpet. Right rib pain and pain to right 5th finger with deformity. Patient has healing bruise around right eye from previous fall a week ago.

## 2023-05-01 NOTE — Discharge Instructions (Signed)
 You have a fracture of the fifth metacarpal of your hand. We have placed you in a temporary splint here.  Continue icing the area You can take Tylenol  or Motrin for pain Recommend follow-up with orthopedic on Monday give them a call I will call you tomorrow with the results of your rib x-ray

## 2023-05-02 NOTE — ED Provider Notes (Signed)
 St[facility]    CSN: [id number] Arrival date & time: 05/01/23  1923      History   Chief Complaint Chief Complaint  Patient presents with   Rib Injury   Finger Injury    HPI Stacy Clayton is a 73 y.o. female.   Patient is a 73 year old female who presents today for finger injury and rib injury after fall.  Reports that she tripped on the carpet and fell landing on the right hand and right side.  Patient had previous fall about a week ago with bruising around the eye that is old.  Denies hitting face or head and this fall.  She does have deformity to the right fifth finger.  No numbness, tingling or loss of sensation.     Past Medical History:  Diagnosis Date   Benign hypertension 02/29/2020   Cancer [facility])    DVT, lower extremity, distal, acute, right (HCC) 12/12/2020   GERD without esophagitis    Migraine 01/25/2019   PONV (postoperative nausea and vomiting)    PONV (postoperative nausea and vomiting) 05/24/2021    Patient Active Problem List   Diagnosis Date Noted   PONV (postoperative nausea and vomiting) 05/24/2021   GERD without esophagitis 05/24/2021   Cancer (HCC) 05/24/2021   DVT, lower extremity, distal, acute, right (HCC) 12/12/2020   Benign hypertension 02/29/2020   Migraine 01/25/2019    Past Surgical History:  Procedure Laterality Date   BREAST BIOPSY Left    BREAST LUMPECTOMY WITH RADIOACTIVE SEED LOCALIZATION Left 09/30/2018   Procedure: LEFT BREAST LUMPECTOMY WITH RADIOACTIVE SEED LOCALIZATION;  Surgeon: Sim Dryer, MD;  Location: [facility];  Service: General;  Laterality: Left;   CESAREAN SECTION     CHOLECYSTECTOMY  12/2007   ESOPHAGOGASTRODUODENOSCOPY (EGD) WITH PROPOFOL  N/A 12/23/2019   Procedure: ESOPHAGOGASTRODUODENOSCOPY (EGD) WITH PROPOFOL ;  Surgeon: Alvis Jourdain, MD;  Location: [facility];  Service: Endoscopy;  Laterality: N/A;   ESOPHAGOGASTRODUODENOSCOPY (EGD) WITH PROPOFOL  N/A 08/17/2020   Procedure:  ESOPHAGOGASTRODUODENOSCOPY (EGD) WITH PROPOFOL ;  Surgeon: Alvis Jourdain, MD;  Location: [facility];  Service: Endoscopy;  Laterality: N/A;   INCISION AND DRAINAGE  12/12/2011   Procedure: INCISION AND DRAINAGE;  Surgeon: Milagros Alf, MD;  Location: [facility];  Service: Orthopedics;  Laterality: Left;  Incision and drainage of open left thumb distal phalanax fracture and nail bed injury   NAILBED REPAIR  12/12/2011   Procedure: NAILBED REPAIR;  Surgeon: Milagros Alf, MD;  Location: [facility];  Service: Orthopedics;  Laterality: Left;   PERCUTANEOUS PINNING  12/12/2011   Procedure: PERCUTANEOUS PINNING EXTREMITY;  Surgeon: Milagros Alf, MD;  Location: [facility];  Service: Orthopedics;  Laterality: Left;  Pinning of distal phalanax fracture of left thumb   SKIN CANCER EXCISION     TONSILLECTOMY     UPPER ESOPHAGEAL ENDOSCOPIC ULTRASOUND (EUS) N/A 12/23/2019   Procedure: UPPER ESOPHAGEAL ENDOSCOPIC ULTRASOUND (EUS);  Surgeon: Alvis Jourdain, MD;  Location: [facility];  Service: Endoscopy;  Laterality: N/A;   UPPER ESOPHAGEAL ENDOSCOPIC ULTRASOUND (EUS) N/A 08/17/2020   Procedure: UPPER ESOPHAGEAL ENDOSCOPIC ULTRASOUND (EUS);  Surgeon: Alvis Jourdain, MD;  Location: [facility];  Service: Endoscopy;  Laterality: N/A;    OB History   No obstetric history on file.      Home Medications    Prior to Admission medications   Medication Sig Start Date End Date Taking? Authorizing Provider  aspirin EC 81 MG tablet Take 81 mg by mouth daily. Swallow whole.    [provider]  atorvastatin (LIPITOR) 20 MG tablet Take 20 mg by mouth daily.    [provider]  CALCIUM PO Take 1 tablet by mouth daily.    [provider]  FIBER PO Take 1 tablet by mouth daily.    [provider]  metFORMIN (GLUCOPHAGE-XR) 500 MG 24 hr tablet Take 500 mg by mouth at bedtime.    [provider]  metoprolol  tartrate (LOPRESSOR ) 100 MG tablet Take 1 tablet (100 mg total) by mouth  once for 1 dose. Please take 2 hours before CT 05/24/21 05/24/21  Hassan Links, MD  metoprolol  tartrate (LOPRESSOR ) 25 MG tablet Take 12.5 mg by mouth in the morning and at bedtime. 10/11/19   [provider]  nitroGLYCERIN  (NITROSTAT ) 0.4 MG SL tablet Place 1 tablet (0.4 mg total) under the tongue every 5 (five) minutes as needed for chest pain. 06/07/21   Hassan Links, MD  omeprazole (PRILOSEC) 40 MG capsule Take 40 mg by mouth daily before breakfast. 12/09/19   [provider]  SUMAtriptan (IMITREX) 50 MG tablet Take 50 mg by mouth every 2 (two) hours as needed for migraine. 02/16/20   [provider]  triamterene-hydrochlorothiazide (DYAZIDE) 37.5-25 MG capsule Take 1 capsule by mouth in the morning.    [provider]    Family History Family History  Problem Relation Age of Onset   Hypertension Mother    Diabetes Mother    Cancer Mother    Breast cancer Maternal Aunt    Breast cancer Cousin     Social History Social History   Tobacco Use   Smoking status: Never    Passive exposure: Past   Smokeless tobacco: Never  Vaping Use   Vaping status: Never Used  Substance Use Topics   Alcohol use: No   Drug use: Never     Allergies   Erythromycin   Review of Systems Review of Systems  See HPI Physical Exam Triage Vital Signs ED Triage Vitals  Encounter Vitals Group     BP 05/01/23 1942 (!) 142/86     Systolic BP Percentile --      Diastolic BP Percentile --      Pulse Rate 05/01/23 1942 77     Resp 05/01/23 1942 20     Temp 05/01/23 1942 98.5 F (36.9 C)     Temp Source 05/01/23 1942 Oral     SpO2 05/01/23 1942 97 %     Weight --      Height --      Head Circumference --      Peak Flow --      Pain Score 05/01/23 1944 5     Pain Loc --      Pain Education --      Exclude from Growth Chart --    No data found.  Updated Vital Signs BP (!) 142/86 (BP Location: Left Arm)   Pulse 77   Temp 98.5 F (36.9 C) (Oral)   Resp  20   SpO2 97%   Visual Acuity Right Eye Distance:   Left Eye Distance:   Bilateral Distance:    Right Eye Near:   Left Eye Near:    Bilateral Near:     Physical Exam Vitals and nursing note reviewed.  Constitutional:      General: She is not in acute distress.    Appearance: Normal appearance. She is not ill-appearing, toxic-appearing or diaphoretic.  Pulmonary:     Effort: Pulmonary  effort is normal.  Chest:       Comments: TTP no bruising, swelling  Musculoskeletal:        General: Swelling, tenderness, deformity and signs of injury present.     Right hand: Swelling, deformity and bony tenderness present.     Comments: Deformity to the right 5th finger with bruising and swelling.   Neurological:     Mental Status: She is alert.  Psychiatric:        Mood and Affect: Mood normal.      UC Treatments / Results  Labs (all labs ordered are listed, but only abnormal results are displayed) Labs Reviewed - No data to display  EKG   Radiology DG Ribs Unilateral W/Chest Right Result Date: 05/01/2023 CLINICAL DATA:  Tripped and fell, right rib pain EXAM: RIGHT RIBS AND CHEST - 3+ VIEW COMPARISON:  03/27/2021 FINDINGS: Frontal view of the chest as well as frontal and oblique views of the right thoracic cage are obtained. Cardiac silhouette is unremarkable. No airspace disease, effusion, or pneumothorax. There are no acute displaced fractures. IMPRESSION: 1. No acute intrathoracic process.  No displaced right rib fracture. Electronically Signed   By: Bobbye Burrow M.D.   On: 05/01/2023 20:32   DG Hand Complete Right Result Date: 05/01/2023 CLINICAL DATA:  Right hand injury, fifth digit pain and deformity EXAM: RIGHT HAND - COMPLETE 3+ VIEW COMPARISON:  None Available. FINDINGS: Frontal, oblique, and lateral views of the right hand are obtained. There is a minimally displaced oblique fracture through the fifth metacarpal diaphysis, with 2 mm of displacement of the fracture line.  There is also a minimally displaced intra-articular fracture at the volar aspect proximal margin fifth middle phalanx, best seen on the lateral view. Multifocal osteoarthritis, most pronounced throughout the interphalangeal joints and first carpometacarpal joint. Soft tissue swelling of the ulnar aspect of the hand and throughout the fifth digit. IMPRESSION: 1. Oblique minimally displaced fifth metacarpal fracture as above. 2. Minimally displaced intra-articular fracture volar aspect base of the fifth middle phalanx. 3. Soft tissue swelling of the hand and fifth digit. 4. 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 Electronically Signed   By: Bobbye Burrow M.D.   On: 05/01/2023 20:29    Procedures Procedures (including critical care time)  Medications Ordered in UC Medications - No data to display  Initial Impression / Assessment and Plan / UC Course  I have reviewed the triage vital signs and the nursing notes.  Pertinent labs & imaging results that were available during my care of the patient were reviewed by me and considered in my medical decision making (see chart for details).     Close displaced fracture of the fifth metacarpal-patient placed in splint here for stabilization. Recommended ice, elevate and ibuprofen for pain as needed Follow-up with [facility] on Monday  Rib injury-x-ray of the ribs normal.  Most likely bad bruise.  Recommend similar with ice and ibuprofen as needed Follow-up as needed Final Clinical Impressions(s) / UC Diagnoses   Final diagnoses:  Fall, initial encounter  Closed displaced fracture of fifth metacarpal bone of right hand, unspecified portion of metacarpal, initial encounter  Rib injury     Discharge Instructions      You have a fracture of the fifth metacarpal of your hand. We have placed you in a temporary splint here.  Continue icing the area You can take Tylenol  or Motrin for pain Recommend follow-up with orthopedic on  Monday give them a call I will  call you tomorrow with the results of your rib x-ray    ED Prescriptions   None    PDMP not reviewed this encounter.   Landa Pine, FNP 05/02/23 1042

## 2023-05-05 DIAGNOSIS — S62356A Nondisplaced fracture of shaft of fifth metacarpal bone, right hand, initial encounter for closed fracture: Secondary | ICD-10-CM | POA: Diagnosis not present

## 2023-05-05 DIAGNOSIS — S62656A Nondisplaced fracture of medial phalanx of right little finger, initial encounter for closed fracture: Secondary | ICD-10-CM | POA: Diagnosis not present

## 2023-05-26 DIAGNOSIS — S62356A Nondisplaced fracture of shaft of fifth metacarpal bone, right hand, initial encounter for closed fracture: Secondary | ICD-10-CM | POA: Diagnosis not present

## 2023-06-18 DIAGNOSIS — M8588 Other specified disorders of bone density and structure, other site: Secondary | ICD-10-CM | POA: Diagnosis not present

## 2023-06-18 DIAGNOSIS — Z01419 Encounter for gynecological examination (general) (routine) without abnormal findings: Secondary | ICD-10-CM | POA: Diagnosis not present

## 2023-06-18 DIAGNOSIS — Z1231 Encounter for screening mammogram for malignant neoplasm of breast: Secondary | ICD-10-CM | POA: Diagnosis not present

## 2023-06-18 DIAGNOSIS — Z780 Asymptomatic menopausal state: Secondary | ICD-10-CM | POA: Diagnosis not present

## 2023-06-18 DIAGNOSIS — N952 Postmenopausal atrophic vaginitis: Secondary | ICD-10-CM | POA: Diagnosis not present

## 2023-06-23 DIAGNOSIS — S62356A Nondisplaced fracture of shaft of fifth metacarpal bone, right hand, initial encounter for closed fracture: Secondary | ICD-10-CM | POA: Diagnosis not present

## 2023-07-30 DIAGNOSIS — S62356A Nondisplaced fracture of shaft of fifth metacarpal bone, right hand, initial encounter for closed fracture: Secondary | ICD-10-CM | POA: Diagnosis not present

## 2023-08-04 DIAGNOSIS — M7041 Prepatellar bursitis, right knee: Secondary | ICD-10-CM | POA: Diagnosis not present

## 2023-08-26 DIAGNOSIS — M7062 Trochanteric bursitis, left hip: Secondary | ICD-10-CM | POA: Diagnosis not present

## 2023-08-26 DIAGNOSIS — E1169 Type 2 diabetes mellitus with other specified complication: Secondary | ICD-10-CM | POA: Diagnosis not present

## 2023-08-26 DIAGNOSIS — E1142 Type 2 diabetes mellitus with diabetic polyneuropathy: Secondary | ICD-10-CM | POA: Diagnosis not present

## 2023-08-26 DIAGNOSIS — Z6826 Body mass index (BMI) 26.0-26.9, adult: Secondary | ICD-10-CM | POA: Diagnosis not present

## 2023-08-26 DIAGNOSIS — S336XXA Sprain of sacroiliac joint, initial encounter: Secondary | ICD-10-CM | POA: Diagnosis not present

## 2023-08-26 DIAGNOSIS — F5104 Psychophysiologic insomnia: Secondary | ICD-10-CM | POA: Diagnosis not present

## 2023-08-26 DIAGNOSIS — E785 Hyperlipidemia, unspecified: Secondary | ICD-10-CM | POA: Diagnosis not present

## 2023-09-30 DIAGNOSIS — R7303 Prediabetes: Secondary | ICD-10-CM | POA: Diagnosis not present

## 2023-11-02 DIAGNOSIS — I509 Heart failure, unspecified: Secondary | ICD-10-CM | POA: Diagnosis not present

## 2023-11-02 DIAGNOSIS — E785 Hyperlipidemia, unspecified: Secondary | ICD-10-CM | POA: Diagnosis not present

## 2023-11-02 DIAGNOSIS — E1142 Type 2 diabetes mellitus with diabetic polyneuropathy: Secondary | ICD-10-CM | POA: Diagnosis not present

## 2023-11-02 DIAGNOSIS — R32 Unspecified urinary incontinence: Secondary | ICD-10-CM | POA: Diagnosis not present

## 2023-11-02 DIAGNOSIS — M1990 Unspecified osteoarthritis, unspecified site: Secondary | ICD-10-CM | POA: Diagnosis not present

## 2023-11-02 DIAGNOSIS — Z7982 Long term (current) use of aspirin: Secondary | ICD-10-CM | POA: Diagnosis not present

## 2023-11-02 DIAGNOSIS — Z833 Family history of diabetes mellitus: Secondary | ICD-10-CM | POA: Diagnosis not present

## 2023-11-02 DIAGNOSIS — I25119 Atherosclerotic heart disease of native coronary artery with unspecified angina pectoris: Secondary | ICD-10-CM | POA: Diagnosis not present

## 2023-11-02 DIAGNOSIS — I110 Hypertensive heart disease with heart failure: Secondary | ICD-10-CM | POA: Diagnosis not present

## 2023-11-04 DIAGNOSIS — M25571 Pain in right ankle and joints of right foot: Secondary | ICD-10-CM | POA: Diagnosis not present

## 2023-11-10 ENCOUNTER — Encounter (HOSPITAL_COMMUNITY): Payer: Self-pay

## 2023-11-10 ENCOUNTER — Other Ambulatory Visit: Payer: Self-pay

## 2023-11-10 ENCOUNTER — Inpatient Hospital Stay (HOSPITAL_COMMUNITY)
Admission: EM | Admit: 2023-11-10 | Discharge: 2023-11-12 | DRG: 308 | Disposition: A | Discharge status: Home / Self Care | Attending: Internal Medicine | Admitting: Internal Medicine

## 2023-11-10 ENCOUNTER — Emergency Department (HOSPITAL_COMMUNITY)

## 2023-11-10 ENCOUNTER — Encounter (HOSPITAL_BASED_OUTPATIENT_CLINIC_OR_DEPARTMENT_OTHER): Payer: Self-pay

## 2023-11-10 ENCOUNTER — Ambulatory Visit (HOSPITAL_BASED_OUTPATIENT_CLINIC_OR_DEPARTMENT_OTHER)
Admission: EM | Admit: 2023-11-10 | Discharge: 2023-11-10 | Disposition: A | Discharge status: Other Acute Inpatient Hospital | Attending: Family Medicine | Admitting: Family Medicine

## 2023-11-10 DIAGNOSIS — Z803 Family history of malignant neoplasm of breast: Secondary | ICD-10-CM

## 2023-11-10 DIAGNOSIS — J984 Other disorders of lung: Secondary | ICD-10-CM | POA: Diagnosis not present

## 2023-11-10 DIAGNOSIS — Z8249 Family history of ischemic heart disease and other diseases of the circulatory system: Secondary | ICD-10-CM

## 2023-11-10 DIAGNOSIS — N179 Acute kidney failure, unspecified: Secondary | ICD-10-CM | POA: Diagnosis present

## 2023-11-10 DIAGNOSIS — Z85828 Personal history of other malignant neoplasm of skin: Secondary | ICD-10-CM

## 2023-11-10 DIAGNOSIS — I480 Paroxysmal atrial fibrillation: Secondary | ICD-10-CM | POA: Diagnosis not present

## 2023-11-10 DIAGNOSIS — K219 Gastro-esophageal reflux disease without esophagitis: Secondary | ICD-10-CM | POA: Diagnosis present

## 2023-11-10 DIAGNOSIS — Z881 Allergy status to other antibiotic agents status: Secondary | ICD-10-CM

## 2023-11-10 DIAGNOSIS — Z833 Family history of diabetes mellitus: Secondary | ICD-10-CM

## 2023-11-10 DIAGNOSIS — I4891 Unspecified atrial fibrillation: Principal | ICD-10-CM | POA: Diagnosis present

## 2023-11-10 DIAGNOSIS — R079 Chest pain, unspecified: Secondary | ICD-10-CM | POA: Diagnosis not present

## 2023-11-10 DIAGNOSIS — E119 Type 2 diabetes mellitus without complications: Secondary | ICD-10-CM | POA: Diagnosis present

## 2023-11-10 DIAGNOSIS — Z7984 Long term (current) use of oral hypoglycemic drugs: Secondary | ICD-10-CM | POA: Diagnosis not present

## 2023-11-10 DIAGNOSIS — Z86718 Personal history of other venous thrombosis and embolism: Secondary | ICD-10-CM

## 2023-11-10 DIAGNOSIS — I4819 Other persistent atrial fibrillation: Principal | ICD-10-CM | POA: Diagnosis present

## 2023-11-10 DIAGNOSIS — I444 Left anterior fascicular block: Secondary | ICD-10-CM | POA: Diagnosis present

## 2023-11-10 DIAGNOSIS — Z7901 Long term (current) use of anticoagulants: Secondary | ICD-10-CM

## 2023-11-10 DIAGNOSIS — E785 Hyperlipidemia, unspecified: Secondary | ICD-10-CM | POA: Diagnosis present

## 2023-11-10 DIAGNOSIS — E876 Hypokalemia: Secondary | ICD-10-CM | POA: Diagnosis not present

## 2023-11-10 DIAGNOSIS — I2690 Septic pulmonary embolism without acute cor pulmonale: Secondary | ICD-10-CM | POA: Diagnosis not present

## 2023-11-10 DIAGNOSIS — I2699 Other pulmonary embolism without acute cor pulmonale: Secondary | ICD-10-CM | POA: Diagnosis present

## 2023-11-10 DIAGNOSIS — Z7982 Long term (current) use of aspirin: Secondary | ICD-10-CM

## 2023-11-10 DIAGNOSIS — G43909 Migraine, unspecified, not intractable, without status migrainosus: Secondary | ICD-10-CM | POA: Diagnosis present

## 2023-11-10 DIAGNOSIS — I10 Essential (primary) hypertension: Secondary | ICD-10-CM | POA: Diagnosis present

## 2023-11-10 DIAGNOSIS — D72829 Elevated white blood cell count, unspecified: Secondary | ICD-10-CM | POA: Diagnosis present

## 2023-11-10 DIAGNOSIS — Z79899 Other long term (current) drug therapy: Secondary | ICD-10-CM

## 2023-11-10 DIAGNOSIS — R590 Localized enlarged lymph nodes: Secondary | ICD-10-CM | POA: Diagnosis not present

## 2023-11-10 DIAGNOSIS — J9811 Atelectasis: Secondary | ICD-10-CM | POA: Diagnosis not present

## 2023-11-10 LAB — COMPREHENSIVE METABOLIC PANEL WITH GFR
ALT: 20 U/L (ref 0–44)
AST: 32 U/L (ref 15–41)
Albumin: 3.3 g/dL — ABNORMAL LOW (ref 3.5–5.0)
Alkaline Phosphatase: 72 U/L (ref 38–126)
Anion gap: 10 (ref 5–15)
BUN: 28 mg/dL — ABNORMAL HIGH (ref 8–23)
CO2: 23 mmol/L (ref 22–32)
Calcium: 8.8 mg/dL — ABNORMAL LOW (ref 8.9–10.3)
Chloride: 106 mmol/L (ref 98–111)
Creatinine, Ser: 1.29 mg/dL — ABNORMAL HIGH (ref 0.44–1.00)
GFR, Estimated: 44 mL/min — ABNORMAL LOW (ref >60)
Glucose, Bld: 126 mg/dL — ABNORMAL HIGH (ref 70–99)
Potassium: 3.9 mmol/L (ref 3.5–5.1)
Sodium: 139 mmol/L (ref 135–145)
Total Bilirubin: 1 mg/dL (ref 0.0–1.2)
Total Protein: 5.9 g/dL — ABNORMAL LOW (ref 6.5–8.1)

## 2023-11-10 LAB — CBC WITH DIFFERENTIAL/PLATELET
Basophils Absolute: 0.4 K/uL — ABNORMAL HIGH (ref 0.0–0.1)
Basophils Relative: 2 %
Eosinophils Absolute: 0 K/uL (ref 0.0–0.5)
Eosinophils Relative: 0 %
HCT: 46.9 % — ABNORMAL HIGH (ref 36.0–46.0)
Hemoglobin: 15 g/dL (ref 12.0–15.0)
Lymphocytes Relative: 30 %
Lymphs Abs: 5.6 K/uL — ABNORMAL HIGH (ref 0.7–4.0)
MCH: 27.3 pg (ref 26.0–34.0)
MCHC: 32 g/dL (ref 30.0–36.0)
MCV: 85.4 fL (ref 80.0–100.0)
Monocytes Absolute: 0.7 K/uL (ref 0.1–1.0)
Monocytes Relative: 4 %
Neutro Abs: 11.8 K/uL — ABNORMAL HIGH (ref 1.7–7.7)
Neutrophils Relative %: 64 %
Platelets: 284 K/uL (ref 150–400)
RBC: 5.49 MIL/uL — ABNORMAL HIGH (ref 3.87–5.11)
RDW: 15.7 % — ABNORMAL HIGH (ref 11.5–15.5)
WBC: 18.5 K/uL — ABNORMAL HIGH (ref 4.0–10.5)
nRBC: 0 % (ref 0.0–0.2)

## 2023-11-10 LAB — MAGNESIUM: Magnesium: 2 mg/dL (ref 1.7–2.4)

## 2023-11-10 LAB — TROPONIN I (HIGH SENSITIVITY)
Troponin I (High Sensitivity): 10 ng/L (ref <18)
Troponin I (High Sensitivity): 8 ng/L

## 2023-11-10 MED ORDER — DILTIAZEM HCL 25 MG/5ML IV SOLN
10.0000 mg | Freq: Once | INTRAVENOUS | Status: AC
  Administered 2023-11-10: 10 mg via INTRAVENOUS
  Filled 2023-11-10: qty 5

## 2023-11-10 MED ORDER — ATORVASTATIN CALCIUM 10 MG PO TABS
20.0000 mg | ORAL_TABLET | Freq: Every day | ORAL | Status: DC
  Administered 2023-11-11 – 2023-11-12 (×2): 20 mg via ORAL
  Filled 2023-11-10 (×2): qty 2

## 2023-11-10 MED ORDER — PANTOPRAZOLE SODIUM 40 MG PO TBEC
40.0000 mg | DELAYED_RELEASE_TABLET | Freq: Every day | ORAL | Status: DC
  Administered 2023-11-11 – 2023-11-12 (×2): 40 mg via ORAL
  Filled 2023-11-10 (×2): qty 1

## 2023-11-10 MED ORDER — APIXABAN 5 MG PO TABS
5.0000 mg | ORAL_TABLET | Freq: Two times a day (BID) | ORAL | Status: DC
  Administered 2023-11-10: 5 mg via ORAL
  Filled 2023-11-10: qty 1

## 2023-11-10 MED ORDER — DILTIAZEM HCL-DEXTROSE 125-5 MG/125ML-% IV SOLN (PREMIX)
5.0000 mg/h | INTRAVENOUS | Status: DC
  Administered 2023-11-10: 5 mg/h via INTRAVENOUS
  Administered 2023-11-11: 12.5 mg/h via INTRAVENOUS
  Filled 2023-11-10: qty 125

## 2023-11-10 MED ORDER — INSULIN ASPART 100 UNIT/ML IJ SOLN
0.0000 [IU] | Freq: Three times a day (TID) | INTRAMUSCULAR | Status: DC
  Administered 2023-11-11: 1 [IU] via SUBCUTANEOUS
  Filled 2023-11-10: qty 1

## 2023-11-10 NOTE — ED Triage Notes (Signed)
 [facility] from urgent care w/ c/o having afib and increased hr since Sunday. No hx of afib. Pt had chest pressure and sob but not at this time. NS with ems   Hr 70-170  Bp 120/80 100% RA

## 2023-11-10 NOTE — H&P (Signed)
 History and Physical    Stacy Clayton FMW:[id number] DOB: August 26, 1950 DOA: 11/10/2023  Patient coming from: Home.  Chief Complaint: Elevated heart rate.  HPI: Stacy Clayton is a 73 y.o. female with history of hypertension, diabetes mellitus type 2, GERD, hyperlipidemia prior history of DVT about 3 years ago when she was on blood thinner for three months presents to the ER with elevated heart rate has noticed on the Apple watch.  Patient had pain in the right heel last week and was prescribed prednisone for 5 days which she took last dose yesterday.  Last teo days she has been having some chest discomfort and when her friend came today and checked on her heart rate on the Apple watch it was showing that she was in A-fib.  Patient went to the urgent care and was referred to the ER.  ED Course: In the ER patient is found to be in A-fib with RVR.  Was given 2 doses of Cardizem bolus despite which patient was still in A-fib with RVR.  Was started on Cardizem infusion.  Was also given 500 cc normal saline bolus.  Labs showed WBC of 18.5 hemoglobin 15 creatinine 1.2.  Troponins were negative.  BNP 342.  Review of Systems: As per HPI, rest all negative.   Past Medical History:  Diagnosis Date   Benign hypertension 02/29/2020   Cancer [facility])    DVT, lower extremity, distal, acute, right (HCC) 12/12/2020   GERD without esophagitis    Migraine 01/25/2019   PONV (postoperative nausea and vomiting)    PONV (postoperative nausea and vomiting) 05/24/2021    Past Surgical History:  Procedure Laterality Date   BREAST BIOPSY Left    BREAST LUMPECTOMY WITH RADIOACTIVE SEED LOCALIZATION Left 09/30/2018   Procedure: LEFT BREAST LUMPECTOMY WITH RADIOACTIVE SEED LOCALIZATION;  Surgeon: Vanderbilt Ned, MD;  Location: [facility];  Service: General;  Laterality: Left;   CESAREAN SECTION     CHOLECYSTECTOMY  12/2007   ESOPHAGOGASTRODUODENOSCOPY (EGD) WITH PROPOFOL  N/A 12/23/2019   Procedure:  ESOPHAGOGASTRODUODENOSCOPY (EGD) WITH PROPOFOL ;  Surgeon: Rollin Dover, MD;  [facility];  Service: Endoscopy;  Laterality: N/A;   ESOPHAGOGASTRODUODENOSCOPY (EGD) WITH PROPOFOL  N/A 08/17/2020   Procedure: ESOPHAGOGASTRODUODENOSCOPY (EGD) WITH PROPOFOL ;  Surgeon: Rollin Dover, MD;  Location: [facility];  Service: Endoscopy;  Laterality: N/A;   INCISION AND DRAINAGE  12/12/2011   Procedure: INCISION AND DRAINAGE;  Surgeon: Franky JONELLE Curia, MD;  Location: [facility];  Service: Orthopedics;  Laterality: Left;  Incision and drainage of open left thumb distal phalanax fracture and nail bed injury   NAILBED REPAIR  12/12/2011   Procedure: NAILBED REPAIR;  Surgeon: Franky JONELLE Curia, MD;  Location: [facility];  Service: Orthopedics;  Laterality: Left;   PERCUTANEOUS PINNING  12/12/2011   Procedure: PERCUTANEOUS PINNING EXTREMITY;  Surgeon: Franky JONELLE Curia, MD;  Location: [facility];  Service: Orthopedics;  Laterality: Left;  Pinning of distal phalanax fracture of left thumb   SKIN CANCER EXCISION     TONSILLECTOMY     UPPER ESOPHAGEAL ENDOSCOPIC ULTRASOUND (EUS) N/A 12/23/2019   Procedure: UPPER ESOPHAGEAL ENDOSCOPIC ULTRASOUND (EUS);  Surgeon: Rollin Dover, MD;  Location: [facility];  Service: Endoscopy;  Laterality: N/A;   UPPER ESOPHAGEAL ENDOSCOPIC ULTRASOUND (EUS) N/A 08/17/2020   Procedure: UPPER ESOPHAGEAL ENDOSCOPIC ULTRASOUND (EUS);  Surgeon: Rollin Dover, MD;  Location: [facility];  Service: Endoscopy;  Laterality: N/A;     reports that she has never smoked. She has been exposed to tobacco smoke. She  has never used smokeless tobacco. She reports that she does not drink alcohol and does not use drugs.  Allergies  Allergen Reactions   Erythromycin Diarrhea    Family History  Problem Relation Age of Onset   Hypertension Mother    Diabetes Mother    Cancer Mother    Breast cancer Maternal Aunt    Breast cancer Cousin     Prior to Admission medications   Medication Sig Start Date End  Date Taking? Authorizing Provider  aspirin EC 81 MG tablet Take 81 mg by mouth daily. Swallow whole.    [provider]  atorvastatin (LIPITOR) 20 MG tablet Take 20 mg by mouth daily.    [provider]  CALCIUM PO Take 1 tablet by mouth daily.    [provider]  FIBER PO Take 1 tablet by mouth daily.    [provider]  metFORMIN (GLUCOPHAGE-XR) 500 MG 24 hr tablet Take 500 mg by mouth at bedtime.    [provider]  metoprolol  tartrate (LOPRESSOR ) 100 MG tablet Take 1 tablet (100 mg total) by mouth once for 1 dose. Please take 2 hours before CT 05/24/21 05/24/21  Monetta Redell PARAS, MD  metoprolol  tartrate (LOPRESSOR ) 25 MG tablet Take 12.5 mg by mouth in the morning and at bedtime. 10/11/19   [provider]  nitroGLYCERIN  (NITROSTAT ) 0.4 MG SL tablet Place 1 tablet (0.4 mg total) under the tongue every 5 (five) minutes as needed for chest pain. 06/07/21   Monetta Redell PARAS, MD  omeprazole (PRILOSEC) 40 MG capsule Take 40 mg by mouth daily before breakfast. 12/09/19   [provider]  SUMAtriptan (IMITREX) 50 MG tablet Take 50 mg by mouth every 2 (two) hours as needed for migraine. 02/16/20   [provider]  triamterene-hydrochlorothiazide (DYAZIDE) 37.5-25 MG capsule Take 1 capsule by mouth in the morning.    [provider]    Physical Exam: Constitutional: Moderately built and nourished. Vitals:   11/10/23 2056 11/10/23 2057 11/10/23 2058 11/10/23 2059  BP:      Pulse: 97 (!) 109 (!) 106 79  Resp: 19 20 19  (!) 22  Temp:      TempSrc:      SpO2: 100% 100% 100% 100%   Eyes: Anicteric no pallor. ENMT: No discharge from the ears eyes nose or mouth. Neck: No mass felt.  No neck rigidity. Respiratory: No rhonchi or crepitations. Cardiovascular: S1-S2 heard. Abdomen: Soft nontender bowel sound present. Musculoskeletal: No edema. Skin: No rash. Neurologic: Alert awake oriented to time place and person.  Moves all  extremities. Psychiatric: Appears normal.  Normal affect.   Labs on Admission: I have personally reviewed following labs and imaging studies  CBC: Recent Labs  Lab 11/10/23 2038  WBC 18.5*  NEUTROABS 11.8*  HGB 15.0  HCT 46.9*  MCV 85.4  PLT 284   Basic Metabolic Panel: Recent Labs  Lab 11/10/23 2038  NA 139  K 3.9  CL 106  CO2 23  GLUCOSE 126*  BUN 28*  CREATININE 1.29*  CALCIUM 8.8*  MG 2.0   GFR: CrCl cannot be calculated (Unknown ideal weight.). Liver Function Tests: Recent Labs  Lab 11/10/23 2038  AST 32  ALT 20  ALKPHOS 72  BILITOT 1.0  PROT 5.9*  ALBUMIN 3.3*   No results for input(s): LIPASE, AMYLASE in the last 168 hours. No results for input(s): AMMONIA in the last 168 hours. Coagulation Profile: No results for input(s): INR, PROTIME in the last  168 hours. Cardiac Enzymes: No results for input(s): CKTOTAL, CKMB, CKMBINDEX, TROPONINI in the last 168 hours. BNP (last 3 results) No results for input(s): PROBNP in the last 8760 hours. HbA1C: No results for input(s): HGBA1C in the last 72 hours. CBG: No results for input(s): GLUCAP in the last 168 hours. Lipid Profile: No results for input(s): CHOL, HDL, LDLCALC, TRIG, CHOLHDL, LDLDIRECT in the last 72 hours. Thyroid  Function Tests: No results for input(s): TSH, T4TOTAL, FREET4, T3FREE, THYROIDAB in the last 72 hours. Anemia Panel: No results for input(s): VITAMINB12, FOLATE, FERRITIN, TIBC, IRON, RETICCTPCT in the last 72 hours. Urine analysis: No results found for: COLORURINE, APPEARANCEUR, LABSPEC, PHURINE, GLUCOSEU, HGBUR, BILIRUBINUR, KETONESUR, PROTEINUR, UROBILINOGEN, NITRITE, LEUKOCYTESUR Sepsis Labs: @LABRCNTIP (procalcitonin:4,lacticidven:4) )No results found for this or any previous visit (from the past 240 hours).   Radiological Exams on Admission: DG Chest Port 1 View Result Date: 11/10/2023 EXAM: 1  VIEW(S) XRAY OF THE CHEST 11/10/2023 09:30:00 PM COMPARISON: 05/01/2023 CLINICAL HISTORY: chest pain FINDINGS: LUNGS AND PLEURA: Left base atelectasis. Biapical scarring. No pulmonary edema. No pleural effusion. No pneumothorax. HEART AND MEDIASTINUM: No acute abnormality of the cardiac and mediastinal silhouettes. BONES AND SOFT TISSUES: No acute osseous abnormality. IMPRESSION: 1. No acute cardiopulmonary process identified. 2. Left basilar atelectasis and biapical scarring, unchanged from prior study. Electronically signed by: Franky Crease MD 11/10/2023 09:33 PM EST RP Workstation: [id number]    EKG: Independently reviewed.  A-fib with RVR.  Assessment/Plan Principal Problem:   Atrial fibrillation with RVR (HCC)    A-fib with RVR - new onset.  CHA2DS2-VASc score of at least three.  Patient agrees to being started on Eliquis for anticoagulation after risks explained.  On Cardizem infusion at this time.  Patient takes metoprolol  12.5 twice daily for hypertension which we will increase the dose and try to wean off Cardizem.  Will consult cardiology.  Check 2D echo and TSH.  Will also check D-dimer given the prior history of DVT and patient having some chest discomfort if positive will get CT angiogram of the chest. Acute renal failure with creatinine mildly increased from recent past.  ER physician had given fluid bolus in the ER.  Will monitor. Hypertension on metoprolol  dose will be increased given the A-fib.  See #1. Hyperlipidemia on statin. Diabetes mellitus type 2 takes metformin presently on sliding scale coverage.  Hemoglobin A1c 6.1. Leukocytosis could be from recent use of steroids.  No definite signs of infection.  Will closely monitor.  Since patient has new onset A-fib will need close monitoring further workup and more than 2 midnight stay.   DVT prophylaxis: Eliquis. Code Status: Full code. Family Communication: Discussed with patient. Disposition Plan: Monitored bed. Consults  called: Cardiology. Admission status: Observation.

## 2023-11-10 NOTE — ED Provider Notes (Signed)
  EMERGENCY DEPARTMENT AT [facility] Provider Note   CSN: [id number] Arrival date & time: 11/10/23  8040     Patient presents with: Atrial Fibrillation   Stacy Clayton is a 73 y.o. female.   Patient presents to the ED from [facility] with new onset of a tachycardic rhythm since Sunday. She was noted to be in atrial fibrillation with AVR. She endorses some intermittent chest discomfort and shortness of breath. She recently completed a course of prednisone. She takes lopressor  and dyazide for blood pressure control. She is pre-diabetic and takes metformin.  The history is provided by the patient.  Atrial Fibrillation This is a new problem. The current episode started 2 days ago. The problem occurs constantly. The problem has not changed since onset.Associated symptoms include shortness of breath. Pertinent negatives include no abdominal pain.       Prior to Admission medications   Medication Sig Start Date End Date Taking? Authorizing Provider  aspirin EC 81 MG tablet Take 81 mg by mouth daily. Swallow whole.    [provider]  atorvastatin (LIPITOR) 20 MG tablet Take 20 mg by mouth daily.    [provider]  CALCIUM PO Take 1 tablet by mouth daily.    [provider]  FIBER PO Take 1 tablet by mouth daily.    [provider]  metFORMIN (GLUCOPHAGE-XR) 500 MG 24 hr tablet Take 500 mg by mouth at bedtime.    [provider]  metoprolol  tartrate (LOPRESSOR ) 100 MG tablet Take 1 tablet (100 mg total) by mouth once for 1 dose. Please take 2 hours before CT 05/24/21 05/24/21  Monetta Redell PARAS, MD  metoprolol  tartrate (LOPRESSOR ) 25 MG tablet Take 12.5 mg by mouth in the morning and at bedtime. 10/11/19   [provider]  nitroGLYCERIN  (NITROSTAT ) 0.4 MG SL tablet Place 1 tablet (0.4 mg total) under the tongue every 5 (five) minutes as needed for chest pain. 06/07/21   Monetta Redell PARAS, MD  omeprazole (PRILOSEC) 40 MG  capsule Take 40 mg by mouth daily before breakfast. 12/09/19   [provider]  SUMAtriptan (IMITREX) 50 MG tablet Take 50 mg by mouth every 2 (two) hours as needed for migraine. 02/16/20   [provider]  triamterene-hydrochlorothiazide (DYAZIDE) 37.5-25 MG capsule Take 1 capsule by mouth in the morning.    [provider]    Allergies: Erythromycin    Review of Systems  Respiratory:  Positive for chest tightness and shortness of breath.   Gastrointestinal:  Negative for abdominal pain.  Neurological:  Negative for weakness.  All other systems reviewed and are negative.   Updated Vital Signs BP (!) 123/94 (BP Location: Right Arm)   Pulse (!) 151   Temp 98.1 F (36.7 C) (Oral)   Resp (!) 23   SpO2 100%   Physical Exam Vitals and nursing note reviewed.  HENT:     Head: Normocephalic.     Nose: Nose normal.     Mouth/Throat:     Mouth: Mucous membranes are moist.  Eyes:     Conjunctiva/sclera: Conjunctivae normal.  Cardiovascular:     Rate and Rhythm: Tachycardia present. Rhythm irregular.     Pulses: Normal pulses.     Heart sounds: Normal heart sounds.  Pulmonary:     Effort: Pulmonary effort is normal.     Breath sounds: Normal breath sounds.  Abdominal:     Palpations: Abdomen is soft.  Musculoskeletal:  General: No swelling.     Cervical back: Normal range of motion.  Skin:    General: Skin is warm and dry.  Neurological:     Mental Status: She is alert and oriented to person, place, and time.  Psychiatric:        Mood and Affect: Mood normal.        Behavior: Behavior normal.     (all labs ordered are listed, but only abnormal results are displayed) Labs Reviewed  CBC WITH DIFFERENTIAL/PLATELET  COMPREHENSIVE METABOLIC PANEL WITH GFR  MAGNESIUM  TROPONIN I (HIGH SENSITIVITY)    EKG: EKG Interpretation Date/Time:  Tuesday November 10 2023 20:36:31 EST Ventricular Rate:  116 PR Interval:    QRS Duration:  81 QT  Interval:  308 QTC Calculation: 428 R Axis:   -42  Text Interpretation: Atrial fibrillation Left anterior fascicular block Consider anterior infarct Nonspecific T abnormalities, lateral leads rate slower since earlier in the day Confirmed by Patt Alm DEL ([id number]) on 11/10/2023 8:39:43 PM  Radiology: [facility] Chest Port 1 View Result Date: 11/10/2023 EXAM: 1 VIEW(S) XRAY OF THE CHEST 11/10/2023 09:30:00 PM COMPARISON: 05/01/2023 CLINICAL HISTORY: chest pain FINDINGS: LUNGS AND PLEURA: Left base atelectasis. Biapical scarring. No pulmonary edema. No pleural effusion. No pneumothorax. HEART AND MEDIASTINUM: No acute abnormality of the cardiac and mediastinal silhouettes. BONES AND SOFT TISSUES: No acute osseous abnormality. IMPRESSION: 1. No acute cardiopulmonary process identified. 2. Left basilar atelectasis and biapical scarring, unchanged from prior study. Electronically signed by: Franky Crease MD 11/10/2023 09:33 PM EST RP Workstation: [id number]     Procedures   Medications Ordered in the ED  diltiazem (CARDIZEM) 125 mg in dextrose  5% 125 mL (1 mg/mL) infusion (5 mg/hr Intravenous New Bag/Given 11/10/23 2158)  diltiazem (CARDIZEM) injection 10 mg (10 mg Intravenous Given 11/10/23 2030)  diltiazem (CARDIZEM) injection 10 mg (10 mg Intravenous Given 11/10/23 2100)         Cha2ds2-vasc score of 4                             Medical Decision Making Amount and/or Complexity of Data Reviewed Labs: ordered. ECG/medicine tests: ordered.   Patient received cardizem 10 mg. Heart rate decreased to 110-120. Second dose given with heart rate decreasing to 90-105. Heart rate continues to be  persistently above 100 with minimal activity despite cardizem dosing. Cardizem drip started. Patient noted to have elevated white count--may be prednisone related. Renal insufficiency with GFR 44.  Discussed with hospitalist Virgilio) for admission.     Final diagnoses:  Atrial fibrillation with RVR [facility])     ED Discharge Orders     None          Claudene Alm, NP 11/10/23 2302    Patt Alm Macho, MD 11/10/23 2308

## 2023-11-10 NOTE — ED Notes (Signed)
 Patient is being discharged from the Urgent Care and sent to the Emergency Department via EMS . Per T. Bast, FNP, patient is in need of higher level of care due to new onset afib rapid rate. Patient is aware and verbalizes understanding of plan of care.  Vitals:   11/10/23 1855  BP: 126/86  Pulse: 63  Resp: 20  Temp: 98.4 F (36.9 C)  SpO2: 96%

## 2023-11-10 NOTE — ED Provider Notes (Signed)
 [facility]    CSN: [id number] Arrival date & time: 11/10/23  1834      History   Chief Complaint Chief Complaint  Patient presents with   Chest Pain   Shortness of Breath    HPI Stacy Clayton is a 73 y.o. female.   Patient is a 73 year old female that presents today with increased shortness of breath, chest heaviness over last few days but worsening tonight. Friend had EKG capability on apple watch and showed afib. Patient brought back to treatment area and EKG done immediately. Showing rapid afib 148. No current chest pain, SOB. Just finished prednisone for her foot.     Chest Pain Associated symptoms: shortness of breath   Shortness of Breath Associated symptoms: chest pain     Past Medical History:  Diagnosis Date   Benign hypertension 02/29/2020   Cancer (HCC)    DVT, lower extremity, distal, acute, right (HCC) 12/12/2020   GERD without esophagitis    Migraine 01/25/2019   PONV (postoperative nausea and vomiting)    PONV (postoperative nausea and vomiting) 05/24/2021    Patient Active Problem List   Diagnosis Date Noted   PONV (postoperative nausea and vomiting) 05/24/2021   GERD without esophagitis 05/24/2021   Cancer (HCC) 05/24/2021   DVT, lower extremity, distal, acute, right (HCC) 12/12/2020   Benign hypertension 02/29/2020   Migraine 01/25/2019    Past Surgical History:  Procedure Laterality Date   BREAST BIOPSY Left    BREAST LUMPECTOMY WITH RADIOACTIVE SEED LOCALIZATION Left 09/30/2018   Procedure: LEFT BREAST LUMPECTOMY WITH RADIOACTIVE SEED LOCALIZATION;  Surgeon: Vanderbilt Ned, MD;  Location: [facility];  Service: General;  Laterality: Left;   CESAREAN SECTION     CHOLECYSTECTOMY  12/2007   ESOPHAGOGASTRODUODENOSCOPY (EGD) WITH PROPOFOL  N/A 12/23/2019   Procedure: ESOPHAGOGASTRODUODENOSCOPY (EGD) WITH PROPOFOL ;  Surgeon: Rollin Dover, MD;  Location: [facility];  Service: Endoscopy;  Laterality: N/A;    ESOPHAGOGASTRODUODENOSCOPY (EGD) WITH PROPOFOL  N/A 08/17/2020   Procedure: ESOPHAGOGASTRODUODENOSCOPY (EGD) WITH PROPOFOL ;  Surgeon: Rollin Dover, MD;  Location: [facility];  Service: Endoscopy;  Laterality: N/A;   INCISION AND DRAINAGE  12/12/2011   Procedure: INCISION AND DRAINAGE;  Surgeon: Franky JONELLE Curia, MD;  Location: [facility];  Service: Orthopedics;  Laterality: Left;  Incision and drainage of open left thumb distal phalanax fracture and nail bed injury   NAILBED REPAIR  12/12/2011   Procedure: NAILBED REPAIR;  Surgeon: Franky JONELLE Curia, MD;  Location: [facility];  Service: Orthopedics;  Laterality: Left;   PERCUTANEOUS PINNING  12/12/2011   Procedure: PERCUTANEOUS PINNING EXTREMITY;  Surgeon: Franky JONELLE Curia, MD;  Location: [facility];  Service: Orthopedics;  Laterality: Left;  Pinning of distal phalanax fracture of left thumb   SKIN CANCER EXCISION     TONSILLECTOMY     UPPER ESOPHAGEAL ENDOSCOPIC ULTRASOUND (EUS) N/A 12/23/2019   Procedure: UPPER ESOPHAGEAL ENDOSCOPIC ULTRASOUND (EUS);  Surgeon: Rollin Dover, MD;  Location: [facility];  Service: Endoscopy;  Laterality: N/A;   UPPER ESOPHAGEAL ENDOSCOPIC ULTRASOUND (EUS) N/A 08/17/2020   Procedure: UPPER ESOPHAGEAL ENDOSCOPIC ULTRASOUND (EUS);  Surgeon: Rollin Dover, MD;  Location: [facility];  Service: Endoscopy;  Laterality: N/A;    OB History   No obstetric history on file.      Home Medications    Prior to Admission medications   Medication Sig Start Date End Date Taking? Authorizing Provider  aspirin EC 81 MG tablet Take 81 mg by mouth daily. Swallow whole.    [provider]  atorvastatin (LIPITOR) 20 MG tablet Take 20 mg by mouth daily.    [provider]  CALCIUM PO Take 1 tablet by mouth daily.    [provider]  FIBER PO Take 1 tablet by mouth daily.    [provider]  metFORMIN (GLUCOPHAGE-XR) 500 MG 24 hr tablet Take 500 mg by mouth at bedtime.    [provider]  metoprolol   tartrate (LOPRESSOR ) 100 MG tablet Take 1 tablet (100 mg total) by mouth once for 1 dose. Please take 2 hours before CT 05/24/21 05/24/21  Monetta Redell PARAS, MD  metoprolol  tartrate (LOPRESSOR ) 25 MG tablet Take 12.5 mg by mouth in the morning and at bedtime. 10/11/19   [provider]  nitroGLYCERIN  (NITROSTAT ) 0.4 MG SL tablet Place 1 tablet (0.4 mg total) under the tongue every 5 (five) minutes as needed for chest pain. 06/07/21   Monetta Redell PARAS, MD  omeprazole (PRILOSEC) 40 MG capsule Take 40 mg by mouth daily before breakfast. 12/09/19   [provider]  SUMAtriptan (IMITREX) 50 MG tablet Take 50 mg by mouth every 2 (two) hours as needed for migraine. 02/16/20   [provider]  triamterene-hydrochlorothiazide (DYAZIDE) 37.5-25 MG capsule Take 1 capsule by mouth in the morning.    [provider]    Family History Family History  Problem Relation Age of Onset   Hypertension Mother    Diabetes Mother    Cancer Mother    Breast cancer Maternal Aunt    Breast cancer Cousin     Social History Social History   Tobacco Use   Smoking status: Never    Passive exposure: Past   Smokeless tobacco: Never  Vaping Use   Vaping status: Never Used  Substance Use Topics   Alcohol use: No   Drug use: Never     Allergies   Erythromycin   Review of Systems Review of Systems  Respiratory:  Positive for shortness of breath.   Cardiovascular:  Positive for chest pain.     Physical Exam Triage Vital Signs ED Triage Vitals  Encounter Vitals Group     BP 11/10/23 1855 126/86     Girls Systolic BP Percentile --      Girls Diastolic BP Percentile --      Boys Systolic BP Percentile --      Boys Diastolic BP Percentile --      Pulse Rate 11/10/23 1855 63     Resp 11/10/23 1855 20     Temp 11/10/23 1855 98.4 F (36.9 C)     Temp Source 11/10/23 1855 Oral     SpO2 11/10/23 1855 96 %     Weight --      Height --      Head Circumference --      Peak Flow  --      Pain Score 11/10/23 1856 0     Pain Loc --      Pain Education --      Exclude from Growth Chart --    No data found.  Updated Vital Signs BP 126/86 (BP Location: Right Arm)   Pulse 63   Temp 98.4 F (36.9 C) (Oral)   Resp 20   SpO2 96%   Visual Acuity Right Eye Distance:   Left Eye Distance:   Bilateral Distance:    Right Eye Near:   Left Eye Near:    Bilateral Near:     Physical Exam Vitals and nursing  note reviewed.  Constitutional:      General: She is not in acute distress.    Appearance: Normal appearance. She is not ill-appearing, toxic-appearing or diaphoretic.  Cardiovascular:     Rate and Rhythm: Rhythm irregular.  Pulmonary:     Effort: Pulmonary effort is normal.     Breath sounds: Normal breath sounds.  Skin:    General: Skin is warm and dry.  Neurological:     Mental Status: She is alert.  Psychiatric:        Mood and Affect: Mood normal.      UC Treatments / Results  Labs (all labs ordered are listed, but only abnormal results are displayed) Labs Reviewed - No data to display  EKG   Radiology No results found.  Procedures Procedures (including critical care time)  Medications Ordered in UC Medications - No data to display  Initial Impression / Assessment and Plan / UC Course  I have reviewed the triage vital signs and the nursing notes.  Pertinent labs & imaging results that were available during my care of the patient were reviewed by me and considered in my medical decision making (see chart for details).     A-fib with RVR-patient presents today with chest tightness, shortness of breath.  EKG today revealed A-fib with RVR. No hx of same.  She is currently not having any chest pain or shortness of breath.  She is stable and vital signs are stable.  EMS called for transport to the ER at [facility] for further management of this. Final Clinical Impressions(s) / UC Diagnoses   Final diagnoses:  Atrial fibrillation with RVR [facility])    Discharge Instructions   None    ED Prescriptions   None    PDMP not reviewed this encounter.   Adah Wilbert LABOR, FNP 11/10/23 1904

## 2023-11-10 NOTE — ED Notes (Addendum)
 Pt able to ambulate to the bathroom with steady and normal gait with no distress.

## 2023-11-10 NOTE — ED Notes (Signed)
 [facility] here to transport patient to [facility] ER. Bedside report given. Face Sheet, ED Timeline, and med list provided. Care transferred to EMS crew.

## 2023-11-10 NOTE — ED Notes (Signed)
 Added another number for emergency contact: Stacy Clayton [phone] [facility]

## 2023-11-10 NOTE — Progress Notes (Addendum)
 PHARMACY - ANTICOAGULATION CONSULT NOTE  Pharmacy Consult for apixaban Indication: atrial fibrillation  Allergies  Allergen Reactions   Erythromycin Diarrhea    Patient Measurements:    Vital Signs: Temp: 98.1 F (36.7 C) (11/04 2002) Temp Source: Oral (11/04 2002) BP: 116/78 (11/04 2030) Pulse Rate: 79 (11/04 2059)  Labs: Recent Labs    11/10/23 2038  HGB 15.0  HCT 46.9*  PLT 284  CREATININE 1.29*  TROPONINIHS 10    CrCl cannot be calculated (Unknown ideal weight.).   Medical History: Past Medical History:  Diagnosis Date   Benign hypertension 02/29/2020   Cancer [facility])    DVT, lower extremity, distal, acute, right (HCC) 12/12/2020   GERD without esophagitis    Migraine 01/25/2019   PONV (postoperative nausea and vomiting)    PONV (postoperative nausea and vomiting) 05/24/2021    Assessment: 57 yoF presented with new onset afib RVR. Pharmacy consulted to dose apixaban for afib. PMH includes DVT (completed 70m of anticoagulation)  -CBC WNL  Goal of Therapy:  Monitor platelets by anticoagulation protocol: Yes   Plan:  Apixaban 5mg  PO BID Follow up patient education and cost  Lynwood Poplar, PharmD, BCPS Clinical Pharmacist 11/10/2023 11:42 PM    ADDENDUM -Ctangio shows new found PE no RHS noted. Discussed with provider since given 1 dose of eliquis will give another to complete 10mg  and then continue course with eliquis.  Goal of Therapy:  Monitor platelets by anticoagulation protocol: Yes  Plan:  Additional apixaban 5mg  x1 Apixaban 10mg  PO BID for 7 days, then 5mg  PO BID  Follow up patient education and cost  Lynwood Poplar, PharmD, BCPS Clinical Pharmacist 11/11/2023 3:34 AM

## 2023-11-10 NOTE — ED Triage Notes (Addendum)
 Increased shortness of breath, chest heaviness over last few days but worsening tonight. Friend had EKG capability on apple watch and showed afib. Patient brought back to treatment area and EKG done immediately. Showing rapid afib 148. Provider at bedside. Started on prednisone last week for her foot. Began feeling increased reflux on Saturday with heaviness in her chest.

## 2023-11-10 NOTE — ED Notes (Signed)
 Skin p/w/d. Resp even and unlabored. Patient conversing calmly with friend.

## 2023-11-10 NOTE — ED Notes (Signed)
 EMS called to transport patient to hospital.

## 2023-11-10 NOTE — ED Notes (Signed)
 Add another number for emergency contact: Stacy Clayton [phone]

## 2023-11-11 ENCOUNTER — Observation Stay (HOSPITAL_COMMUNITY)

## 2023-11-11 ENCOUNTER — Telehealth (HOSPITAL_COMMUNITY): Payer: Self-pay

## 2023-11-11 ENCOUNTER — Other Ambulatory Visit (HOSPITAL_COMMUNITY): Payer: Self-pay

## 2023-11-11 DIAGNOSIS — Z8249 Family history of ischemic heart disease and other diseases of the circulatory system: Secondary | ICD-10-CM | POA: Diagnosis not present

## 2023-11-11 DIAGNOSIS — Z7984 Long term (current) use of oral hypoglycemic drugs: Secondary | ICD-10-CM | POA: Diagnosis not present

## 2023-11-11 DIAGNOSIS — I4819 Other persistent atrial fibrillation: Secondary | ICD-10-CM

## 2023-11-11 DIAGNOSIS — Z881 Allergy status to other antibiotic agents status: Secondary | ICD-10-CM | POA: Diagnosis not present

## 2023-11-11 DIAGNOSIS — E119 Type 2 diabetes mellitus without complications: Secondary | ICD-10-CM | POA: Diagnosis present

## 2023-11-11 DIAGNOSIS — Z7982 Long term (current) use of aspirin: Secondary | ICD-10-CM | POA: Diagnosis not present

## 2023-11-11 DIAGNOSIS — I2699 Other pulmonary embolism without acute cor pulmonale: Secondary | ICD-10-CM

## 2023-11-11 DIAGNOSIS — I4891 Unspecified atrial fibrillation: Secondary | ICD-10-CM

## 2023-11-11 DIAGNOSIS — N179 Acute kidney failure, unspecified: Secondary | ICD-10-CM

## 2023-11-11 DIAGNOSIS — D72829 Elevated white blood cell count, unspecified: Secondary | ICD-10-CM

## 2023-11-11 DIAGNOSIS — I2690 Septic pulmonary embolism without acute cor pulmonale: Secondary | ICD-10-CM

## 2023-11-11 DIAGNOSIS — K219 Gastro-esophageal reflux disease without esophagitis: Secondary | ICD-10-CM | POA: Diagnosis present

## 2023-11-11 DIAGNOSIS — G43909 Migraine, unspecified, not intractable, without status migrainosus: Secondary | ICD-10-CM | POA: Diagnosis present

## 2023-11-11 DIAGNOSIS — Z833 Family history of diabetes mellitus: Secondary | ICD-10-CM | POA: Diagnosis not present

## 2023-11-11 DIAGNOSIS — Z79899 Other long term (current) drug therapy: Secondary | ICD-10-CM | POA: Diagnosis not present

## 2023-11-11 DIAGNOSIS — I480 Paroxysmal atrial fibrillation: Secondary | ICD-10-CM | POA: Diagnosis not present

## 2023-11-11 DIAGNOSIS — I444 Left anterior fascicular block: Secondary | ICD-10-CM | POA: Diagnosis present

## 2023-11-11 DIAGNOSIS — E876 Hypokalemia: Secondary | ICD-10-CM | POA: Diagnosis not present

## 2023-11-11 DIAGNOSIS — E785 Hyperlipidemia, unspecified: Secondary | ICD-10-CM

## 2023-11-11 DIAGNOSIS — Z86718 Personal history of other venous thrombosis and embolism: Secondary | ICD-10-CM | POA: Diagnosis not present

## 2023-11-11 DIAGNOSIS — Z85828 Personal history of other malignant neoplasm of skin: Secondary | ICD-10-CM | POA: Diagnosis not present

## 2023-11-11 DIAGNOSIS — I10 Essential (primary) hypertension: Secondary | ICD-10-CM | POA: Diagnosis present

## 2023-11-11 DIAGNOSIS — R590 Localized enlarged lymph nodes: Secondary | ICD-10-CM | POA: Diagnosis not present

## 2023-11-11 DIAGNOSIS — Z7901 Long term (current) use of anticoagulants: Secondary | ICD-10-CM | POA: Diagnosis not present

## 2023-11-11 DIAGNOSIS — Z803 Family history of malignant neoplasm of breast: Secondary | ICD-10-CM | POA: Diagnosis not present

## 2023-11-11 LAB — HEMOGLOBIN A1C
Hgb A1c MFr Bld: 6.1 % — ABNORMAL HIGH (ref 4.8–5.6)
Mean Plasma Glucose: 128.37 mg/dL

## 2023-11-11 LAB — ECHOCARDIOGRAM COMPLETE
Calc EF: 72.2 %
Height: 66 in
S' Lateral: 3 cm
Single Plane A2C EF: 68.6 %
Single Plane A4C EF: 76.6 %
Weight: 2998.26 [oz_av]

## 2023-11-11 LAB — D-DIMER, QUANTITATIVE: D-Dimer, Quant: 2.49 ug{FEU}/mL — ABNORMAL HIGH (ref 0.00–0.50)

## 2023-11-11 LAB — CBG MONITORING, ED
Glucose-Capillary: 116 mg/dL — ABNORMAL HIGH (ref 70–99)
Glucose-Capillary: 139 mg/dL — ABNORMAL HIGH (ref 70–99)

## 2023-11-11 LAB — TSH: TSH: 1.888 u[IU]/mL (ref 0.350–4.500)

## 2023-11-11 LAB — PATHOLOGIST SMEAR REVIEW

## 2023-11-11 LAB — GLUCOSE, CAPILLARY
Glucose-Capillary: 146 mg/dL — ABNORMAL HIGH (ref 70–99)
Glucose-Capillary: 177 mg/dL — ABNORMAL HIGH (ref 70–99)

## 2023-11-11 LAB — BRAIN NATRIURETIC PEPTIDE: B Natriuretic Peptide: 342.8 pg/mL — ABNORMAL HIGH (ref 0.0–100.0)

## 2023-11-11 MED ORDER — ZOLPIDEM TARTRATE 5 MG PO TABS
5.0000 mg | ORAL_TABLET | Freq: Once | ORAL | Status: AC | PRN
  Administered 2023-11-11: 5 mg via ORAL
  Filled 2023-11-11: qty 1

## 2023-11-11 MED ORDER — INSULIN ASPART 100 UNIT/ML IJ SOLN
INTRAMUSCULAR | Status: AC
  Administered 2023-11-11: 1 [IU] via SUBCUTANEOUS
  Filled 2023-11-11: qty 1

## 2023-11-11 MED ORDER — MELATONIN 5 MG PO TABS: 5.0000 mg | ORAL_TABLET | Freq: Every evening | ORAL | Status: DC | PRN

## 2023-11-11 MED ORDER — APIXABAN 5 MG PO TABS
5.0000 mg | ORAL_TABLET | Freq: Once | ORAL | Status: AC
  Administered 2023-11-11: 5 mg via ORAL
  Filled 2023-11-11: qty 1

## 2023-11-11 MED ORDER — SODIUM CHLORIDE 0.9 % IV SOLN: INTRAVENOUS | Status: AC

## 2023-11-11 MED ORDER — GUAIFENESIN 100 MG/5ML PO LIQD: 5.0000 mL | ORAL | Status: DC | PRN

## 2023-11-11 MED ORDER — HYDRALAZINE HCL 20 MG/ML IJ SOLN: 10.0000 mg | INTRAMUSCULAR | Status: DC | PRN

## 2023-11-11 MED ORDER — GLUCAGON HCL RDNA (DIAGNOSTIC) 1 MG IJ SOLR: 1.0000 mg | INTRAMUSCULAR | Status: DC | PRN

## 2023-11-11 MED ORDER — SENNOSIDES-DOCUSATE SODIUM 8.6-50 MG PO TABS: 1.0000 | ORAL_TABLET | Freq: Every evening | ORAL | Status: DC | PRN

## 2023-11-11 MED ORDER — APIXABAN 5 MG PO TABS
10.0000 mg | ORAL_TABLET | Freq: Two times a day (BID) | ORAL | Status: DC
  Administered 2023-11-11 – 2023-11-12 (×3): 10 mg via ORAL
  Filled 2023-11-11 (×3): qty 2

## 2023-11-11 MED ORDER — METOPROLOL TARTRATE 50 MG PO TABS
50.0000 mg | ORAL_TABLET | Freq: Two times a day (BID) | ORAL | Status: DC
  Administered 2023-11-11 – 2023-11-12 (×3): 50 mg via ORAL
  Filled 2023-11-11 (×2): qty 1
  Filled 2023-11-11: qty 2

## 2023-11-11 MED ORDER — APIXABAN 5 MG PO TABS: 5.0000 mg | ORAL_TABLET | Freq: Two times a day (BID) | ORAL | Status: DC

## 2023-11-11 MED ORDER — ONDANSETRON HCL 4 MG/2ML IJ SOLN: 4.0000 mg | Freq: Four times a day (QID) | INTRAMUSCULAR | Status: DC | PRN

## 2023-11-11 MED ORDER — IOHEXOL 350 MG/ML SOLN
75.0000 mL | Freq: Once | INTRAVENOUS | Status: AC | PRN
  Administered 2023-11-11: 75 mL via INTRAVENOUS

## 2023-11-11 MED ORDER — IPRATROPIUM-ALBUTEROL 0.5-2.5 (3) MG/3ML IN SOLN
3.0000 mL | RESPIRATORY_TRACT | Status: DC | PRN
Start: 2023-11-11 — End: 2023-11-12

## 2023-11-11 NOTE — ED Notes (Signed)
 Heart rate 58, d/c dilt gtt, md notified

## 2023-11-11 NOTE — Progress Notes (Signed)
 BLE venous duplex has been completed.   Results can be found under chart review under CV PROC. 11/11/2023 3:24 PM Selim Durden RVT, RDMS

## 2023-11-11 NOTE — ED Notes (Signed)
Pt went to echo

## 2023-11-11 NOTE — Progress Notes (Signed)
 PROGRESS NOTE    Stacy Clayton  FMW:[id number] DOB: 16-May-1950 DOA: 11/10/2023 PCP: Clemmie Nest, MD    Brief Narrative:   73 year old with history of HTN, DM 2, GERD, HLD, prior history of DVT 3 years ago treated with 3 months of anticoagulation admitted for tachycardia.  In the ER noted to be in atrial fibrillation with RVR started on Cardizem drip.  Eventually also found to have elevated D-dimer therefore CTA chest performed which showed bilateral pulmonary embolism therefore started on Eliquis  Assessment & Plan:  Atrial fibrillation with RVR, improving - Currently remains in atrial fibrillation on Cardizem drip.  Will slowly uptitrate Lopressor .  Continue Eliquis.  Echocardiogram shows preserved EF without any strain pattern.  TSH-normal  Acute bilateral pulmonary embolism without cor pulmonale - No obvious evidence of hemodynamic compromise.  Continue [employer], echocardiogram as above - Check lower extremity Dopplers -Going forward she will likely need lifelong anticoagulation  Acute kidney injury - Baseline creatinine 0.9, admission creatinine 1.29.  Continue IV fluids  Leukocytosis - Likely reactive.  Continue to monitor  Essential hypertension - Currently on Cardizem and Lopressor .  IV as needed  Hyperlipidemia - Statin  Diabetes mellitus type 2 - A1c 6.1.  Sliding scale and Accu-Chek   DVT prophylaxis: apixaban (ELIQUIS) tablet 10 mg  apixaban (ELIQUIS) tablet 5 mg      Code Status: Full Code Family Communication:   Continue hospital stay for atrial fibrillation with RVR and ongoing evaluation of bilateral PE   PT Follow up Recs:   Subjective: Seen at bedside.  Does not any complaints during my evaluation.  Denies any recent surgery, family history, long distance traveling.   Examination:  General exam: Appears calm and comfortable  Respiratory system: Clear to auscultation. Respiratory effort normal. Cardiovascular system: S1 & S2 heard, RRR. No  JVD, murmurs, rubs, gallops or clicks. No pedal edema. Gastrointestinal system: Abdomen is nondistended, soft and nontender. No organomegaly or masses felt. Normal bowel sounds heard. Central nervous system: Alert and oriented. No focal neurological deficits. Extremities: Symmetric 5 x 5 power. Skin: No rashes, lesions or ulcers Psychiatry: Judgement and insight appear normal. Mood & affect appropriate.                Diet Orders (From admission, onward)     Start     Ordered   11/10/23 2340  Diet heart healthy/carb modified Room service appropriate? Yes; Fluid consistency: Thin  Diet effective now       Question Answer Comment  Diet-HS Snack? Nothing   Room service appropriate? Yes   Fluid consistency: Thin      11/10/23 2339            Objective: Vitals:   11/11/23 0830 11/11/23 0845 11/11/23 0917 11/11/23 1150  BP: 100/77 (!) 109/91 116/87 113/72  Pulse: 97 (!) 104 94 67  Resp: 19 17 18 15   Temp:   98 F (36.7 C) 98.3 F (36.8 C)  TempSrc:   Oral Oral  SpO2: 100% 100% 100% 100%  Weight:      Height:        Intake/Output Summary (Last 24 hours) at 11/11/2023 1215 Last data filed at 11/11/2023 1127 Gross per 24 hour  Intake 140.6 ml  Output --  Net 140.6 ml   Filed Weights   11/11/23 0311  Weight: 85 kg    Scheduled Meds:  apixaban  10 mg Oral BID   Followed by   NOREEN ON 11/17/2023] apixaban  5 mg  Oral BID   atorvastatin  20 mg Oral Daily   insulin aspart  0-9 Units Subcutaneous TID WC   metoprolol  tartrate  50 mg Oral BID   pantoprazole  40 mg Oral Daily   Continuous Infusions:  sodium chloride  75 mL/hr at 11/11/23 0914   diltiazem (CARDIZEM) infusion Stopped (11/11/23 1127)    Nutritional status     Body mass index is 30.25 kg/m.  Data Reviewed:   CBC: Recent Labs  Lab 11/10/23 2038  WBC 18.5*  NEUTROABS 11.8*  HGB 15.0  HCT 46.9*  MCV 85.4  PLT 284   Basic Metabolic Panel: Recent Labs  Lab 11/10/23 2038  NA 139   K 3.9  CL 106  CO2 23  GLUCOSE 126*  BUN 28*  CREATININE 1.29*  CALCIUM 8.8*  MG 2.0   GFR: Estimated Creatinine Clearance: 42 mL/min (A) (by C-G formula based on SCr of 1.29 mg/dL (H)). Liver Function Tests: Recent Labs  Lab 11/10/23 2038  AST 32  ALT 20  ALKPHOS 72  BILITOT 1.0  PROT 5.9*  ALBUMIN 3.3*   No results for input(s): LIPASE, AMYLASE in the last 168 hours. No results for input(s): AMMONIA in the last 168 hours. Coagulation Profile: No results for input(s): INR, PROTIME in the last 168 hours. Cardiac Enzymes: No results for input(s): CKTOTAL, CKMB, CKMBINDEX, TROPONINI in the last 168 hours. BNP (last 3 results) No results for input(s): PROBNP in the last 8760 hours. HbA1C: Recent Labs    11/10/23 2038  HGBA1C 6.1*   CBG: Recent Labs  Lab 11/11/23 0715 11/11/23 1138  GLUCAP 116* 139*   Lipid Profile: No results for input(s): CHOL, HDL, LDLCALC, TRIG, CHOLHDL, LDLDIRECT in the last 72 hours. Thyroid  Function Tests: Recent Labs    11/10/23 2325  TSH 1.888   Anemia Panel: No results for input(s): VITAMINB12, FOLATE, FERRITIN, TIBC, IRON, RETICCTPCT in the last 72 hours. Sepsis Labs: No results for input(s): PROCALCITON, LATICACIDVEN in the last 168 hours.  No results found for this or any previous visit (from the past 240 hours).       Radiology Studies: ECHOCARDIOGRAM COMPLETE Result Date: 11/11/2023    ECHOCARDIOGRAM REPORT   Patient Name:   Stacy Clayton Date of Exam: 11/11/2023 Medical Rec #:  [id number]  Height:       66.0 in Accession #:    [id number] Weight:       187.4 lb Date of Birth:  1950-11-19  BSA:          1.945 m Patient Age:    73 years   BP:           116/78 mmHg Patient Gender: F          HR:           82 bpm. Exam Location:  Inpatient Procedure: 2D Echo (Both Spectral and Color Flow Doppler were utilized during            procedure). Indications:    Afib  History:        Patient  has no prior history of Echocardiogram examinations.                 Arrythmias:Atrial Fibrillation.  Sonographer:    Norleen Amour Referring Phys: [id number] ARSHAD N KAKRAKANDY IMPRESSIONS  1. Left ventricular ejection fraction, by estimation, is 70 to 75%. The left ventricle has hyperdynamic function. The left ventricle has no regional wall motion abnormalities. Left ventricular diastolic parameters are indeterminate.  2.  Right ventricular systolic function is normal. The right ventricular size is normal.  3. Left atrial size was mildly dilated.  4. The mitral valve is normal in structure. No evidence of mitral valve regurgitation. No evidence of mitral stenosis.  5. The aortic valve is tricuspid. Aortic valve regurgitation is not visualized. No aortic stenosis is present.  6. There is mild (Grade II) plaque involving the ascending aorta.  7. The inferior vena cava is normal in size with greater than 50% respiratory variability, suggesting right atrial pressure of 3 mmHg. FINDINGS  Left Ventricle: Left ventricular ejection fraction, by estimation, is 70 to 75%. The left ventricle has hyperdynamic function. The left ventricle has no regional wall motion abnormalities. The left ventricular internal cavity size was normal in size. There is no left ventricular hypertrophy. Left ventricular diastolic function could not be evaluated due to atrial fibrillation. Left ventricular diastolic parameters are indeterminate. Right Ventricle: The right ventricular size is normal. No increase in right ventricular wall thickness. Right ventricular systolic function is normal. Left Atrium: Left atrial size was mildly dilated. Right Atrium: Right atrial size was normal in size. Pericardium: There is no evidence of pericardial effusion. Mitral Valve: The mitral valve is normal in structure. No evidence of mitral valve regurgitation. No evidence of mitral valve stenosis. Tricuspid Valve: The tricuspid valve is normal in structure. Tricuspid  valve regurgitation is not demonstrated. No evidence of tricuspid stenosis. Aortic Valve: The aortic valve is tricuspid. Aortic valve regurgitation is not visualized. No aortic stenosis is present. Pulmonic Valve: The pulmonic valve was normal in structure. Pulmonic valve regurgitation is not visualized. No evidence of pulmonic stenosis. Aorta: The aortic root is normal in size and structure. There is mild (Grade II) plaque involving the ascending aorta. Venous: The inferior vena cava is normal in size with greater than 50% respiratory variability, suggesting right atrial pressure of 3 mmHg. IAS/Shunts: No atrial level shunt detected by color flow Doppler.  LEFT VENTRICLE PLAX 2D LVIDd:         4.20 cm     Diastology LVIDs:         3.00 cm     LV e' medial:  8.20 cm/s LV PW:         1.10 cm     LV e' lateral: 10.60 cm/s LV IVS:        0.90 cm LVOT diam:     2.10 cm LV SV:         50 LV SV Index:   26 LVOT Area:     3.46 cm  LV Volumes (MOD) LV vol d, MOD A2C: 40.4 ml LV vol d, MOD A4C: 53.8 ml LV vol s, MOD A2C: 12.7 ml LV vol s, MOD A4C: 12.6 ml LV SV MOD A2C:     27.7 ml LV SV MOD A4C:     53.8 ml LV SV MOD BP:      34.1 ml RIGHT VENTRICLE             IVC RV Basal diam:  3.00 cm     IVC diam: 1.50 cm RV S prime:     16.20 cm/s TAPSE (M-mode): 1.7 cm LEFT ATRIUM             Index        RIGHT ATRIUM           Index LA diam:        3.90 cm 2.00 cm/m   RA Area:  13.20 cm LA Vol (A2C):   56.8 ml 29.20 ml/m  RA Volume:   29.40 ml  15.11 ml/m LA Vol (A4C):   58.2 ml 29.92 ml/m LA Biplane Vol: 59.0 ml 30.33 ml/m  AORTIC VALVE             PULMONIC VALVE LVOT Vmax:   87.50 cm/s  PV Vmax:       0.67 m/s LVOT Vmean:  61.800 cm/s PV Peak grad:  1.8 mmHg LVOT VTI:    0.145 m  AORTA Ao Root diam: 3.20 cm Ao Asc diam:  2.70 cm  SHUNTS Systemic VTI:  0.14 m Systemic Diam: 2.10 cm Annabella Scarce MD Electronically signed by Annabella Scarce MD Signature Date/Time: 11/11/2023/12:06:42 PM    Final    CT Angio Chest  Pulmonary Embolism (PE) W or WO Contrast Result Date: 11/11/2023 CLINICAL DATA:  Suspected pulmonary embolism. EXAM: CT ANGIOGRAPHY CHEST WITH CONTRAST TECHNIQUE: Multidetector CT imaging of the chest was performed using the standard protocol during bolus administration of intravenous contrast. Multiplanar CT image reconstructions and MIPs were obtained to evaluate the vascular anatomy. RADIATION DOSE REDUCTION: This exam was performed according to the departmental dose-optimization program which includes automated exposure control, adjustment of the mA and/or kV according to patient size and/or use of iterative reconstruction technique. CONTRAST:  75mL OMNIPAQUE  IOHEXOL  350 MG/ML SOLN COMPARISON:  June 14, 2021 and October 28, 2019 FINDINGS: Cardiovascular: Satisfactory opacification of the pulmonary arteries to the segmental level. A moderate amount of intraluminal low attenuation is seen within the distal aspect of the right pulmonary artery. This extends to involve the segmental and subsegmental right lower lobe branches of the right pulmonary artery. A mild amount of intraluminal low attenuation is seen within two subsegmental lower lobe branches of the left pulmonary artery. Normal heart size without evidence of right heart strain (RV/LV ratio of approximately 0.94). No pericardial effusion. Mediastinum/Nodes: There is mild right hilar lymphadenopathy. Thyroid  gland, trachea, and esophagus demonstrate no significant findings. Lungs/Pleura: Mild atelectatic changes are seen along the posterior aspect of the bilateral lower lobes. No acute infiltrate, pleural effusion or pneumothorax is identified. Upper Abdomen: Multiple surgical clips are seen within the gallbladder fossa. A stable 4.0 cm low-attenuation (approximately 11.95 Hounsfield units) right adrenal mass is seen. Musculoskeletal: Multilevel degenerative changes are seen throughout the thoracic spine Review of the MIP images confirms the above findings.  IMPRESSION: 1. Moderate amount of pulmonary embolism within the distal aspect of the right pulmonary artery, extending to involve the segmental and subsegmental right lower lobe branches of the right pulmonary artery. 2. Mild amount of pulmonary embolism within two subsegmental lower lobe branches of the left pulmonary artery. 3. Mild right hilar lymphadenopathy, likely reactive. 4. Stable right adrenal adenoma. Electronically Signed   By: Suzen Dials M.D.   On: 11/11/2023 03:02   DG Chest Port 1 View Result Date: 11/10/2023 EXAM: 1 VIEW(S) XRAY OF THE CHEST 11/10/2023 09:30:00 PM COMPARISON: 05/01/2023 CLINICAL HISTORY: chest pain FINDINGS: LUNGS AND PLEURA: Left base atelectasis. Biapical scarring. No pulmonary edema. No pleural effusion. No pneumothorax. HEART AND MEDIASTINUM: No acute abnormality of the cardiac and mediastinal silhouettes. BONES AND SOFT TISSUES: No acute osseous abnormality. IMPRESSION: 1. No acute cardiopulmonary process identified. 2. Left basilar atelectasis and biapical scarring, unchanged from prior study. Electronically signed by: Franky Crease MD 11/10/2023 09:33 PM EST RP Workstation: [id number]           LOS: 0 days   Time spent= 35 mins  Burgess JAYSON Dare, MD [facility]  If 7PM-7AM, please contact night-coverage  11/11/2023, 12:15 PM

## 2023-11-11 NOTE — Telephone Encounter (Signed)
 Pharmacy Patient Advocate Encounter  Insurance verification completed.    The patient is insured through [employer]. Patient has Medicare and is not eligible for a copay card, but may be able to apply for patient assistance or Medicare RX Payment Plan (Patient Must reach out to their plan, if eligible for payment plan), if available.    Ran test claim for Eliquis 5mg  and the current 30 day co-pay is $40.00.   This test claim was processed through [facility]- copay amounts may vary at other pharmacies due to pharmacy/plan contracts, or as the patient moves through the different stages of their insurance plan.

## 2023-11-11 NOTE — Consult Note (Addendum)
 Cardiology Consultation   Patient ID: Stacy Clayton MRN: [id number]; DOB: March 28, 1950  Admit date: 11/10/2023 Date of Consult: 11/11/2023  PCP:  Clemmie Nest, MD   [facility] Providers Cardiologist:  Redell Leiter, MD       Patient Profile: Stacy Clayton is a 73 y.o. female with a hx of hypertension, hyperlipidemia, type 2 diabetes, and prior history of DVT who is being seen 11/11/2023 for the evaluation of atrial fibrillation at the request of Caleen Sora MD.  History of Present Illness: Stacy Clayton is a 73 year old female with prior cardiac history listed below.  Went to the emergency department for chest pain on 03/2021.  An echocardiogram was done on 03/2021 and was a difficult study that showed a normal LVEF of 55 to 60%, no RWMA, mild concentric LVH, diastolic dysfunction, normal RV function, mild aortic sclerosis, and grossly normal valve function.  The patient was worked up and there were no acute concerns.  Patient saw Dr. Leiter on 05/2021 for outpatient follow-up for the chest pain.  A cardiac CT was done on 06/2021 and showed coronary calcium score of 0 and normal coronary arteries.   Patient went to the urgent care because of chest pressure and shortness of breath.  She was found to be in atrial fibrillation and was sent to the emergency department.  She was admitted to the hospitalist service.  A pulmonary CTA was done and showed that the patient had a PE.  She was started on Eliquis for the PE.  Was started on IV Cardizem and metoprolol  tartrate 50 mg twice daily.  On interview patient reported that she presented to the emergency department for chest pain, nausea, and shortness of breath.  Denies any vomiting, diaphoresis, lower extremity edema, fever, chills, orthopnea, melena, hematuria, and hematochezia.  Denies any symptoms with atrial fibrillation.  Denies nicotine use, alcohol use, and illicit substance use.  Labs showed normal TSH of 1.8, high-sensitivity troponins  10 > 8, elevated BNP of 342.8, potassium of 3.9, sodium of 139, AKI with a creatinine of 1.29 baseline is about 0.9, calcium of 8.8, leukocytosis of 18.5 (neutrophil predominance), and hemoglobin of 15.  Pulmonary CTA showed a moderate right pulmonary embolism in the segmental and subsegmental right lower lobe branches.  Mild pulmonary embolism in 2 subsegmental lower branches of the left pulmonary artery.   Echocardiogram was done and showed hyperdynamic LVEF of 70 to 75%, no RWMA, normal RV function, mild left atrial dilation, grossly normal valve function, and IVC with greater than 50% respiratory variability.  EKG showed atrial fibrillation with a rate of 116  Past Medical History:  Diagnosis Date   Benign hypertension 02/29/2020   Cancer [facility])    DVT, lower extremity, distal, acute, right (HCC) 12/12/2020   GERD without esophagitis    Migraine 01/25/2019   PONV (postoperative nausea and vomiting)    PONV (postoperative nausea and vomiting) 05/24/2021    Past Surgical History:  Procedure Laterality Date   BREAST BIOPSY Left    BREAST LUMPECTOMY WITH RADIOACTIVE SEED LOCALIZATION Left 09/30/2018   Procedure: LEFT BREAST LUMPECTOMY WITH RADIOACTIVE SEED LOCALIZATION;  Surgeon: Vanderbilt Ned, MD;  Location: [facility];  Service: General;  Laterality: Left;   CESAREAN SECTION     CHOLECYSTECTOMY  12/2007   ESOPHAGOGASTRODUODENOSCOPY (EGD) WITH PROPOFOL  N/A 12/23/2019   Procedure: ESOPHAGOGASTRODUODENOSCOPY (EGD) WITH PROPOFOL ;  Surgeon: Rollin Dover, MD;  Location: [facility];  Service: Endoscopy;  Laterality: N/A;   ESOPHAGOGASTRODUODENOSCOPY (EGD) WITH PROPOFOL  N/A 08/17/2020  Procedure: ESOPHAGOGASTRODUODENOSCOPY (EGD) WITH PROPOFOL ;  Surgeon: Rollin Dover, MD;  Location: [facility];  Service: Endoscopy;  Laterality: N/A;   INCISION AND DRAINAGE  12/12/2011   Procedure: INCISION AND DRAINAGE;  Surgeon: Franky JONELLE Curia, MD;  Location: [facility];  Service:  Orthopedics;  Laterality: Left;  Incision and drainage of open left thumb distal phalanax fracture and nail bed injury   NAILBED REPAIR  12/12/2011   Procedure: NAILBED REPAIR;  Surgeon: Franky JONELLE Curia, MD;  Location: [facility];  Service: Orthopedics;  Laterality: Left;   PERCUTANEOUS PINNING  12/12/2011   Procedure: PERCUTANEOUS PINNING EXTREMITY;  Surgeon: Franky JONELLE Curia, MD;  Location: [facility];  Service: Orthopedics;  Laterality: Left;  Pinning of distal phalanax fracture of left thumb   SKIN CANCER EXCISION     TONSILLECTOMY     UPPER ESOPHAGEAL ENDOSCOPIC ULTRASOUND (EUS) N/A 12/23/2019   Procedure: UPPER ESOPHAGEAL ENDOSCOPIC ULTRASOUND (EUS);  Surgeon: Rollin Dover, MD;  Location: [facility];  Service: Endoscopy;  Laterality: N/A;   UPPER ESOPHAGEAL ENDOSCOPIC ULTRASOUND (EUS) N/A 08/17/2020   Procedure: UPPER ESOPHAGEAL ENDOSCOPIC ULTRASOUND (EUS);  Surgeon: Rollin Dover, MD;  Location: [facility];  Service: Endoscopy;  Laterality: N/A;     Home Medications:  Prior to Admission medications   Medication Sig Start Date End Date Taking? Authorizing Provider  acetaminophen  (TYLENOL ) 500 MG tablet Take 500 mg by mouth every 6 (six) hours as needed for mild pain (pain score 1-3) or headache.   Yes [provider]  aspirin EC 81 MG tablet Take 81 mg by mouth daily. Swallow whole.   Yes [provider]  atorvastatin (LIPITOR) 20 MG tablet Take 20 mg by mouth at bedtime.   Yes [provider]  Calcium Carb-Cholecalciferol (CALCIUM 1000 + D) 1000-20 MG-MCG TABS Take 1 tablet by mouth daily at 12 noon.   Yes [provider]  Cholecalciferol (D 1000) 25 MCG (1000 UT) capsule Take 1,000 Units by mouth in the morning. 02/29/20  Yes [provider]  cycloSPORINE (RESTASIS) 0.05 % ophthalmic emulsion Place 1 drop into both eyes 2 (two) times daily.   Yes [provider]  eszopiclone (LUNESTA) 1 MG TABS tablet Take 1 mg by mouth at bedtime. 10/31/23   Yes [provider]  Menthol, Topical Analgesic, (BIOFREEZE ROLL-ON EX) Apply 1 Application topically 3 (three) times daily as needed (joint pain).   Yes [provider]  metFORMIN (GLUCOPHAGE-XR) 500 MG 24 hr tablet Take 500 mg by mouth at bedtime.   Yes [provider]  metoprolol  tartrate (LOPRESSOR ) 25 MG tablet Take 12.5 mg by mouth in the morning and at bedtime. 10/11/19  Yes [provider]  omeprazole (PRILOSEC) 40 MG capsule Take 40 mg by mouth daily before breakfast. 12/09/19  Yes [provider]  tiZANidine (ZANAFLEX) 2 MG tablet Take 2 mg by mouth every 8 (eight) hours as needed. 08/26/23  Yes [provider]  triamterene-hydrochlorothiazide (DYAZIDE) 37.5-25 MG capsule Take 1 capsule by mouth in the morning.   Yes [provider]    Scheduled Meds:  apixaban  10 mg Oral BID   Followed by   NOREEN ON 11/17/2023] apixaban  5 mg Oral BID   atorvastatin  20 mg Oral Daily   insulin aspart  0-9 Units Subcutaneous TID WC   metoprolol  tartrate  50 mg Oral BID   pantoprazole  40 mg Oral Daily   Continuous Infusions:  sodium chloride  75 mL/hr at 11/11/23 0914   diltiazem (CARDIZEM)  infusion Stopped (11/11/23 1127)   PRN Meds: glucagon (human recombinant), guaiFENesin, hydrALAZINE, ipratropium-albuterol, ondansetron  (ZOFRAN ) IV, senna-docusate  Allergies:    Allergies  Allergen Reactions   Erythromycin Diarrhea    Social History:   Social History   Socioeconomic History   Marital status: Married    Spouse name: Not on file   Number of children: Not on file   Years of education: Not on file   Highest education level: Not on file  Occupational History   Not on file  Tobacco Use   Smoking status: Never    Passive exposure: Past   Smokeless tobacco: Never  Vaping Use   Vaping status: Never Used  Substance and Sexual Activity   Alcohol use: No   Drug use: Never   Sexual activity: Not on file  Other Topics  Concern   Not on file  Social History Narrative   Not on file   Social Drivers of Health   Financial Resource Strain: Not on file  Food Insecurity: Not on file  Transportation Needs: Not on file  Physical Activity: Not on file  Stress: Not on file  Social Connections: Not on file  Intimate Partner Violence: Not on file    Family History:    Family History  Problem Relation Age of Onset   Hypertension Mother    Diabetes Mother    Cancer Mother    Breast cancer Maternal Aunt    Breast cancer Cousin      ROS:  Please see the history of present illness.   All other ROS reviewed and negative.     Physical Exam/Data: Vitals:   11/11/23 0830 11/11/23 0845 11/11/23 0917 11/11/23 1150  BP: 100/77 (!) 109/91 116/87 113/72  Pulse: 97 (!) 104 94 67  Resp: 19 17 18 15   Temp:   98 F (36.7 C) 98.3 F (36.8 C)  TempSrc:   Oral Oral  SpO2: 100% 100% 100% 100%  Weight:      Height:        Intake/Output Summary (Last 24 hours) at 11/11/2023 1405 Last data filed at 11/11/2023 1127 Gross per 24 hour  Intake 140.6 ml  Output --  Net 140.6 ml      11/11/2023    3:11 AM 05/24/2021    8:19 AM 04/05/2021    8:30 AM  Last 3 Weights  Weight (lbs) 187 lb 6.3 oz 187 lb 6.4 oz 184 lb  Weight (kg) 85 kg 85.004 kg 83.462 kg     Body mass index is 30.25 kg/m.  General:  Well nourished, well developed, in no acute distress.  Alert and orientated on room air HEENT: normal Neck: no JVD Vascular: No carotid bruits; Distal pulses 2+ bilaterally Cardiac:  normal S1, S2; irregularly irregular rhythm; no murmur. Lungs:  clear to auscultation bilaterally, no wheezing, rhonchi or rales  Abd: soft, nontender, no hepatomegaly  Ext: no edema Musculoskeletal:  No deformities. Skin: warm and dry  Neuro: no focal abnormalities noted Psych:  Normal affect   EKG:  The EKG was personally reviewed and demonstrates:  EKG showed atrial fibrillation with a rate of 116 Telemetry:  Telemetry was  personally reviewed and demonstrates: Atrial fibrillation with heart rates in the 80s to 100.  At about 10 AM heart rates improved to the 50s to 70s,  Relevant CV Studies:  Echocardiogram on 11/11/2023 IMPRESSIONS     1. Left ventricular ejection fraction, by estimation, is 70 to 75%. The  left ventricle has hyperdynamic  function. The left ventricle has no  regional wall motion abnormalities. Left ventricular diastolic parameters  are indeterminate.   2. Right ventricular systolic function is normal. The right ventricular  size is normal.   3. Left atrial size was mildly dilated.   4. The mitral valve is normal in structure. No evidence of mitral valve  regurgitation. No evidence of mitral stenosis.   5. The aortic valve is tricuspid. Aortic valve regurgitation is not  visualized. No aortic stenosis is present.   6. There is mild (Grade II) plaque involving the ascending aorta.   7. The inferior vena cava is normal in size with greater than 50%  respiratory variability, suggesting right atrial pressure of 3 mmHg.   Laboratory Data: High Sensitivity Troponin:   Recent Labs  Lab 11/10/23 2038 11/10/23 2238  TROPONINIHS 10 8     Chemistry Recent Labs  Lab 11/10/23 2038  NA 139  K 3.9  CL 106  CO2 23  GLUCOSE 126*  BUN 28*  CREATININE 1.29*  CALCIUM 8.8*  MG 2.0  GFRNONAA 44*  ANIONGAP 10    Recent Labs  Lab 11/10/23 2038  PROT 5.9*  ALBUMIN 3.3*  AST 32  ALT 20  ALKPHOS 72  BILITOT 1.0   Lipids No results for input(s): CHOL, TRIG, HDL, LABVLDL, LDLCALC, CHOLHDL in the last 168 hours.  Hematology Recent Labs  Lab 11/10/23 2038  WBC 18.5*  RBC 5.49*  HGB 15.0  HCT 46.9*  MCV 85.4  MCH 27.3  MCHC 32.0  RDW 15.7*  PLT 284   Thyroid   Recent Labs  Lab 11/10/23 2325  TSH 1.888    BNP Recent Labs  Lab 11/10/23 2317  BNP 342.8*    DDimer  Recent Labs  Lab 11/10/23 2317  DDIMER 2.49*    Radiology/Studies:  ECHOCARDIOGRAM  COMPLETE Result Date: 11/11/2023    ECHOCARDIOGRAM REPORT   Patient Name:   Stacy Clayton Date of Exam: 11/11/2023 Medical Rec #:  [id number]  Height:       66.0 in Accession #:    [id number] Weight:       187.4 lb Date of Birth:  1950/06/15  BSA:          1.945 m Patient Age:    73 years   BP:           116/78 mmHg Patient Gender: F          HR:           82 bpm. Exam Location:  Inpatient Procedure: 2D Echo (Both Spectral and Color Flow Doppler were utilized during            procedure). Indications:    Afib  History:        Patient has no prior history of Echocardiogram examinations.                 Arrythmias:Atrial Fibrillation.  Sonographer:    Norleen Amour Referring Phys: [id number] ARSHAD N KAKRAKANDY IMPRESSIONS  1. Left ventricular ejection fraction, by estimation, is 70 to 75%. The left ventricle has hyperdynamic function. The left ventricle has no regional wall motion abnormalities. Left ventricular diastolic parameters are indeterminate.  2. Right ventricular systolic function is normal. The right ventricular size is normal.  3. Left atrial size was mildly dilated.  4. The mitral valve is normal in structure. No evidence of mitral valve regurgitation. No evidence of mitral stenosis.  5. The aortic valve is tricuspid. Aortic valve regurgitation is not  visualized. No aortic stenosis is present.  6. There is mild (Grade II) plaque involving the ascending aorta.  7. The inferior vena cava is normal in size with greater than 50% respiratory variability, suggesting right atrial pressure of 3 mmHg. FINDINGS  Left Ventricle: Left ventricular ejection fraction, by estimation, is 70 to 75%. The left ventricle has hyperdynamic function. The left ventricle has no regional wall motion abnormalities. The left ventricular internal cavity size was normal in size. There is no left ventricular hypertrophy. Left ventricular diastolic function could not be evaluated due to atrial fibrillation. Left ventricular diastolic parameters  are indeterminate. Right Ventricle: The right ventricular size is normal. No increase in right ventricular wall thickness. Right ventricular systolic function is normal. Left Atrium: Left atrial size was mildly dilated. Right Atrium: Right atrial size was normal in size. Pericardium: There is no evidence of pericardial effusion. Mitral Valve: The mitral valve is normal in structure. No evidence of mitral valve regurgitation. No evidence of mitral valve stenosis. Tricuspid Valve: The tricuspid valve is normal in structure. Tricuspid valve regurgitation is not demonstrated. No evidence of tricuspid stenosis. Aortic Valve: The aortic valve is tricuspid. Aortic valve regurgitation is not visualized. No aortic stenosis is present. Pulmonic Valve: The pulmonic valve was normal in structure. Pulmonic valve regurgitation is not visualized. No evidence of pulmonic stenosis. Aorta: The aortic root is normal in size and structure. There is mild (Grade II) plaque involving the ascending aorta. Venous: The inferior vena cava is normal in size with greater than 50% respiratory variability, suggesting right atrial pressure of 3 mmHg. IAS/Shunts: No atrial level shunt detected by color flow Doppler.  LEFT VENTRICLE PLAX 2D LVIDd:         4.20 cm     Diastology LVIDs:         3.00 cm     LV e' medial:  8.20 cm/s LV PW:         1.10 cm     LV e' lateral: 10.60 cm/s LV IVS:        0.90 cm LVOT diam:     2.10 cm LV SV:         50 LV SV Index:   26 LVOT Area:     3.46 cm  LV Volumes (MOD) LV vol d, MOD A2C: 40.4 ml LV vol d, MOD A4C: 53.8 ml LV vol s, MOD A2C: 12.7 ml LV vol s, MOD A4C: 12.6 ml LV SV MOD A2C:     27.7 ml LV SV MOD A4C:     53.8 ml LV SV MOD BP:      34.1 ml RIGHT VENTRICLE             IVC RV Basal diam:  3.00 cm     IVC diam: 1.50 cm RV S prime:     16.20 cm/s TAPSE (M-mode): 1.7 cm LEFT ATRIUM             Index        RIGHT ATRIUM           Index LA diam:        3.90 cm 2.00 cm/m   RA Area:     13.20 cm LA Vol  (A2C):   56.8 ml 29.20 ml/m  RA Volume:   29.40 ml  15.11 ml/m LA Vol (A4C):   58.2 ml 29.92 ml/m LA Biplane Vol: 59.0 ml 30.33 ml/m  AORTIC VALVE  PULMONIC VALVE LVOT Vmax:   87.50 cm/s  PV Vmax:       0.67 m/s LVOT Vmean:  61.800 cm/s PV Peak grad:  1.8 mmHg LVOT VTI:    0.145 m  AORTA Ao Root diam: 3.20 cm Ao Asc diam:  2.70 cm  SHUNTS Systemic VTI:  0.14 m Systemic Diam: 2.10 cm Stacy Scarce MD Electronically signed by Stacy Scarce MD Signature Date/Time: 11/11/2023/12:06:42 PM    Final    CT Angio Chest Pulmonary Embolism (PE) W or WO Contrast Result Date: 11/11/2023 CLINICAL DATA:  Suspected pulmonary embolism. EXAM: CT ANGIOGRAPHY CHEST WITH CONTRAST TECHNIQUE: Multidetector CT imaging of the chest was performed using the standard protocol during bolus administration of intravenous contrast. Multiplanar CT image reconstructions and MIPs were obtained to evaluate the vascular anatomy. RADIATION DOSE REDUCTION: This exam was performed according to the departmental dose-optimization program which includes automated exposure control, adjustment of the mA and/or kV according to patient size and/or use of iterative reconstruction technique. CONTRAST:  75mL OMNIPAQUE  IOHEXOL  350 MG/ML SOLN COMPARISON:  June 14, 2021 and October 28, 2019 FINDINGS: Cardiovascular: Satisfactory opacification of the pulmonary arteries to the segmental level. A moderate amount of intraluminal low attenuation is seen within the distal aspect of the right pulmonary artery. This extends to involve the segmental and subsegmental right lower lobe branches of the right pulmonary artery. A mild amount of intraluminal low attenuation is seen within two subsegmental lower lobe branches of the left pulmonary artery. Normal heart size without evidence of right heart strain (RV/LV ratio of approximately 0.94). No pericardial effusion. Mediastinum/Nodes: There is mild right hilar lymphadenopathy. Thyroid  gland, trachea, and  esophagus demonstrate no significant findings. Lungs/Pleura: Mild atelectatic changes are seen along the posterior aspect of the bilateral lower lobes. No acute infiltrate, pleural effusion or pneumothorax is identified. Upper Abdomen: Multiple surgical clips are seen within the gallbladder fossa. A stable 4.0 cm low-attenuation (approximately 11.95 Hounsfield units) right adrenal mass is seen. Musculoskeletal: Multilevel degenerative changes are seen throughout the thoracic spine Review of the MIP images confirms the above findings. IMPRESSION: 1. Moderate amount of pulmonary embolism within the distal aspect of the right pulmonary artery, extending to involve the segmental and subsegmental right lower lobe branches of the right pulmonary artery. 2. Mild amount of pulmonary embolism within two subsegmental lower lobe branches of the left pulmonary artery. 3. Mild right hilar lymphadenopathy, likely reactive. 4. Stable right adrenal adenoma. Electronically Signed   By: Suzen Dials M.D.   On: 11/11/2023 03:02   DG Chest Port 1 View Result Date: 11/10/2023 EXAM: 1 VIEW(S) XRAY OF THE CHEST 11/10/2023 09:30:00 PM COMPARISON: 05/01/2023 CLINICAL HISTORY: chest pain FINDINGS: LUNGS AND PLEURA: Left base atelectasis. Biapical scarring. No pulmonary edema. No pleural effusion. No pneumothorax. HEART AND MEDIASTINUM: No acute abnormality of the cardiac and mediastinal silhouettes. BONES AND SOFT TISSUES: No acute osseous abnormality. IMPRESSION: 1. No acute cardiopulmonary process identified. 2. Left basilar atelectasis and biapical scarring, unchanged from prior study. Electronically signed by: Franky Crease MD 11/10/2023 09:33 PM EST RP Workstation: [id number]     Assessment and Plan:  Stacy Clayton is a 73 y.o. female with a hx of hypertension, hyperlipidemia, type 2 diabetes, and prior history of DVT who is being seen 11/11/2023 for the evaluation of atrial fibrillation at the request of Caleen Sora MD.  New  onset atrial fibrillation CHA2DS2-VASc Score = 4 [CHF History: 0, HTN History: 1, Diabetes History: 1, Stroke History: 0, Vascular Disease History: 0,  Age Score: 1, Gender Score: 1].  Therefore, the patient's annual risk of stroke is 4.8 %.    Echocardiogram was done and showed hyperdynamic LVEF of 70 to 75%, no RWMA, normal RV function, mild left atrial dilation, grossly normal valve function, and IVC with greater than 50% respiratory variability. Will prioritize rate control for now and avoid cardioversion as patient currently has a pulmonary embolism.  Heart rates are currently well-controlled on telemetry Continue Eliquis 10 mg twice daily.  This is the dose for the patient's PE Continue IV Cardizem.  May consider transitioning to oral. Continue metoprolol  tartrate 50 mg twice daily Scheduled follow-up appointment with Dr. Monetta on 01/12/2023 this is the soonest appointment I was able to get.   PE Has a history of prior DVTs.  Presented to the hospital for chest pain. High-sensitivity troponins negative x 2. Pulmonary CTA showed a moderate right pulmonary embolism in the segmental and subsegmental right lower lobe branches.  Mild pulmonary embolism in 2 subsegmental lower branches of the left pulmonary artery.  Continue Eliquis 2 mg twice Management per primary   Hypertension Hold home blood pressure medications.  Will prioritize rate control as per atrial fibrillation above.   Type 2 diabetes Hyperlipidemia Continue atorvastatin 20 mg daily No need for aspirin given is on Eliquis.   Otherwise management per primary     Risk Assessment/Risk Scores:       CHA2DS2-VASc Score = 4   This indicates a 4.8% annual risk of stroke. The patient's score is based upon: CHF History: 0 HTN History: 1 Diabetes History: 1 Stroke History: 0 Vascular Disease History: 0 Age Score: 1 Gender Score: 1        For questions or updates, please contact [facility] Please  consult [email or web address] for contact info under     Signed, Morse Clause, PA-C  11/11/2023 2:05 PM

## 2023-11-11 NOTE — Telephone Encounter (Signed)
 Pharmacy Patient Advocate Encounter  Insurance verification completed.    The patient is insured through [employer]. Patient has Medicare and is not eligible for a copay card, but may be able to apply for patient assistance or Medicare RX Payment Plan (Patient Must reach out to their plan, if eligible for payment plan), if available.    Ran test claim for Xarelto 20mg  and the current 30 day co-pay is $40.00.   This test claim was processed through [facility]- copay amounts may vary at other pharmacies due to pharmacy/plan contracts, or as the patient moves through the different stages of their insurance plan.

## 2023-11-11 NOTE — Significant Event (Signed)
 I was notified that the patient's CT angiogram of the chest shows moderate amount of pulmonary embolism within the distal aspect of the right pulmonary artery extending to involve the segmental and subsegmental lower lobe branches of the right pulmonary artery.  Mild pulmonary embolism of the subsegmental lower lobe branch of the left pulmonary artery.  Discussed with pharmacy and patient's Eliquis dose was increased for PE.  I discussed with patient about the findings.  Will closely observe presently hemodynamically stable.  2D echo is pending.  Troponins were negative.  BNP 342.  Stacy Clayton

## 2023-11-11 NOTE — Progress Notes (Signed)
 Patient is in NSR. EKG in chart.

## 2023-11-11 NOTE — Plan of Care (Signed)

## 2023-11-11 NOTE — ED Notes (Signed)
Ordered hospital bed for patient

## 2023-11-11 NOTE — Hospital Course (Addendum)
 Brief Narrative:   73 year old with history of HTN, DM 2, GERD, HLD, prior history of DVT 3 years ago treated with 3 months of anticoagulation admitted for tachycardia.  In the ER noted to be in atrial fibrillation with RVR started on Cardizem drip.  Eventually also found to have elevated D-dimer therefore CTA chest performed which showed bilateral pulmonary embolism therefore started on Eliquis.  Echocardiogram was unremarkable, lower extremity Dopplers was negative.  Atrial fibrillation was eventually well-controlled and she converted to normal sinus rhythm on p.o. metoprolol .  Doing significantly well and stable for discharge on p.o. anticoagulation.  Assessment & Plan:  Atrial fibrillation with RVR, improving - Heart rate is now improved.  Appears to be paroxysmal in nature.  Will repeat another EKG this morning.  Weaned off Cardizem drip, tolerating p.o. metoprolol .  Will continue p.o. Eliquis.  Will have her follow-up outpatient  Acute bilateral pulmonary embolism without cor pulmonale - No obvious evidence of hemodynamic compromise.  Echocardiogram is overall unremarkable without any strain pattern.  Tolerating Eliquis - Lower extremity Dopplers is negative for DVT  Acute kidney injury, resolved - Baseline creatinine 0.9, admission creatinine 1.29.  Continue IV fluids  Leukocytosis, improved - Likely reactive.  Continue to monitor  Essential hypertension - On metoprolol   Hyperlipidemia - Statin  Diabetes mellitus type 2 - A1c 6.1.  Sliding scale and Accu-Chek  Hypokalemia - As needed repletion   DVT prophylaxis: apixaban (ELIQUIS) tablet 10 mg  apixaban (ELIQUIS) tablet 5 mg      Code Status: Full Code Family Communication:   Hopefully discharge later today or tomorrow depending on her exercise tolerance/ambulation   PT Follow up Recs:   Subjective: Seen at bedside, feeling significantly well.  Ambulating in the hallway without any complaints including chest pain,  lightheadedness, dizziness She has now converted to normal sinus rhythm  Examination:  General exam: Appears calm and comfortable  Respiratory system: Clear to auscultation. Respiratory effort normal. Cardiovascular system: S1 & S2 heard, RRR. No JVD, murmurs, rubs, gallops or clicks. No pedal edema. Gastrointestinal system: Abdomen is nondistended, soft and nontender. No organomegaly or masses felt. Normal bowel sounds heard. Central nervous system: Alert and oriented. No focal neurological deficits. Extremities: Symmetric 5 x 5 power. Skin: No rashes, lesions or ulcers Psychiatry: Judgement and insight appear normal. Mood & affect appropriate.

## 2023-11-11 NOTE — ED Notes (Signed)
Up to bedside commode

## 2023-11-11 NOTE — Progress Notes (Signed)
  Echocardiogram 2D Echocardiogram has been performed.  Norleen ORN Elber Galyean 11/11/2023, 8:25 AM

## 2023-11-11 NOTE — ED Notes (Signed)
 Pt up to bedside commode, pt had small soft brown bm, pt denies pain, pt has no requests at this time

## 2023-11-11 NOTE — ED Notes (Signed)
 Patient transported to CT

## 2023-11-12 ENCOUNTER — Other Ambulatory Visit (HOSPITAL_COMMUNITY): Payer: Self-pay

## 2023-11-12 DIAGNOSIS — I480 Paroxysmal atrial fibrillation: Secondary | ICD-10-CM | POA: Diagnosis not present

## 2023-11-12 DIAGNOSIS — I2699 Other pulmonary embolism without acute cor pulmonale: Secondary | ICD-10-CM | POA: Diagnosis not present

## 2023-11-12 LAB — BASIC METABOLIC PANEL WITH GFR
Anion gap: 9 (ref 5–15)
BUN: 15 mg/dL (ref 8–23)
CO2: 22 mmol/L (ref 22–32)
Calcium: 8.2 mg/dL — ABNORMAL LOW (ref 8.9–10.3)
Chloride: 110 mmol/L (ref 98–111)
Creatinine, Ser: 0.87 mg/dL (ref 0.44–1.00)
GFR, Estimated: >60 mL/min (ref >60)
Glucose, Bld: 110 mg/dL — ABNORMAL HIGH (ref 70–99)
Potassium: 3.4 mmol/L — ABNORMAL LOW (ref 3.5–5.1)
Sodium: 141 mmol/L (ref 135–145)

## 2023-11-12 LAB — GLUCOSE, CAPILLARY: Glucose-Capillary: 122 mg/dL — ABNORMAL HIGH (ref 70–99)

## 2023-11-12 LAB — CBC
HCT: 41.9 % (ref 36.0–46.0)
Hemoglobin: 13.8 g/dL (ref 12.0–15.0)
MCH: 27.6 pg (ref 26.0–34.0)
MCHC: 32.9 g/dL (ref 30.0–36.0)
MCV: 83.8 fL (ref 80.0–100.0)
Platelets: 229 K/uL (ref 150–400)
RBC: 5 MIL/uL (ref 3.87–5.11)
RDW: 15.6 % — ABNORMAL HIGH (ref 11.5–15.5)
WBC: 12.4 K/uL — ABNORMAL HIGH (ref 4.0–10.5)
nRBC: 0 % (ref 0.0–0.2)

## 2023-11-12 LAB — MAGNESIUM: Magnesium: 2.1 mg/dL (ref 1.7–2.4)

## 2023-11-12 MED ORDER — METOPROLOL TARTRATE 50 MG PO TABS
50.0000 mg | ORAL_TABLET | Freq: Two times a day (BID) | ORAL | 0 refills | Status: AC
  Filled 2023-11-12: qty 60, 30d supply, fill #0

## 2023-11-12 MED ORDER — APIXABAN (ELIQUIS) VTE STARTER PACK (10MG AND 5MG)
ORAL_TABLET | ORAL | 0 refills | Status: DC
  Filled 2023-11-12: qty 74, 30d supply, fill #0

## 2023-11-12 MED ORDER — POTASSIUM CHLORIDE CRYS ER 20 MEQ PO TBCR
40.0000 meq | EXTENDED_RELEASE_TABLET | Freq: Once | ORAL | Status: AC
  Administered 2023-11-12: 40 meq via ORAL
  Filled 2023-11-12: qty 2

## 2023-11-12 NOTE — Discharge Summary (Signed)
 Physician Discharge Summary  Allysa Governale FMW:[id number] DOB: 23-Jan-1950 DOA: 11/10/2023  PCP: Clemmie Nest, MD  Admit date: 11/10/2023 Discharge date: 11/12/2023  Admitted From: Home Disposition: Home  Recommendations for Outpatient Follow-up:  Follow up with PCP in 1-2 weeks Please obtain BMP/CBC in one week your next doctors visit.  Eliquis twice daily, starter pack prescribed, likely will need to be on this lifelong unless she develops any complications Metoprolol  50 mg twice daily Follow-up outpatient cardiology   Discharge Condition: Stable CODE STATUS: Full code Diet recommendation: Diabetic  Brief/Interim Summary: Brief Narrative:   73 year old with history of HTN, DM 2, GERD, HLD, prior history of DVT 3 years ago treated with 3 months of anticoagulation admitted for tachycardia.  In the ER noted to be in atrial fibrillation with RVR started on Cardizem drip.  Eventually also found to have elevated D-dimer therefore CTA chest performed which showed bilateral pulmonary embolism therefore started on Eliquis.  Echocardiogram was unremarkable, lower extremity Dopplers was negative.  Atrial fibrillation was eventually well-controlled and she converted to normal sinus rhythm on p.o. metoprolol .  Doing significantly well and stable for discharge on p.o. anticoagulation.  Assessment & Plan:  Atrial fibrillation with RVR, improving - Heart rate is now improved.  Appears to be paroxysmal in nature.  Will repeat another EKG this morning.  Weaned off Cardizem drip, tolerating p.o. metoprolol .  Will continue p.o. Eliquis.  Will have her follow-up outpatient  Acute bilateral pulmonary embolism without cor pulmonale - No obvious evidence of hemodynamic compromise.  Echocardiogram is overall unremarkable without any strain pattern.  Tolerating Eliquis - Lower extremity Dopplers is negative for DVT  Acute kidney injury, resolved - Baseline creatinine 0.9, admission creatinine 1.29.   Continue IV fluids  Leukocytosis, improved - Likely reactive.  Continue to monitor  Essential hypertension - On metoprolol   Hyperlipidemia - Statin  Diabetes mellitus type 2 - A1c 6.1.  Sliding scale and Accu-Chek  Hypokalemia - As needed repletion   DVT prophylaxis: apixaban (ELIQUIS) tablet 10 mg  apixaban (ELIQUIS) tablet 5 mg      Code Status: Full Code Family Communication:   Hopefully discharge later today or tomorrow depending on her exercise tolerance/ambulation   PT Follow up Recs:   Subjective: Seen at bedside, feeling significantly well.  Ambulating in the hallway without any complaints including chest pain, lightheadedness, dizziness She has now converted to normal sinus rhythm  Examination:  General exam: Appears calm and comfortable  Respiratory system: Clear to auscultation. Respiratory effort normal. Cardiovascular system: S1 & S2 heard, RRR. No JVD, murmurs, rubs, gallops or clicks. No pedal edema. Gastrointestinal system: Abdomen is nondistended, soft and nontender. No organomegaly or masses felt. Normal bowel sounds heard. Central nervous system: Alert and oriented. No focal neurological deficits. Extremities: Symmetric 5 x 5 power. Skin: No rashes, lesions or ulcers Psychiatry: Judgement and insight appear normal. Mood & affect appropriate.    Discharge Diagnoses:  Principal Problem:   Acute pulmonary embolism (HCC) Active Problems:   Benign hypertension   GERD without esophagitis   Atrial fibrillation with RVR (HCC)   ARF (acute renal failure)   Leucocytosis   Diabetes mellitus type 2 in nonobese [facility])   Persistent atrial fibrillation (HCC)      Discharge Exam: Vitals:   11/12/23 0733 11/12/23 0734  BP: 130/80 130/80  Pulse: 71   Resp: 17 17  Temp: 98.3 F (36.8 C)   SpO2: 95%    Vitals:   11/11/23 2300 11/12/23 0423 11/12/23 9266  11/12/23 0734  BP: 106/69 125/80 130/80 130/80  Pulse: 71  71   Resp: 20 20 17 17   Temp: 98  F (36.7 C) 97.9 F (36.6 C) 98.3 F (36.8 C)   TempSrc: Oral Oral Oral   SpO2:  100% 95%   Weight:      Height:          Discharge Instructions   Allergies as of 11/12/2023       Reactions   Erythromycin Diarrhea        Medication List     TAKE these medications    acetaminophen  500 MG tablet Commonly known as: TYLENOL  Take 500 mg by mouth every 6 (six) hours as needed for mild pain (pain score 1-3) or headache.   aspirin EC 81 MG tablet Take 81 mg by mouth daily. Swallow whole.   atorvastatin 20 MG tablet Commonly known as: LIPITOR Take 20 mg by mouth at bedtime.   BIOFREEZE ROLL-ON EX Apply 1 Application topically 3 (three) times daily as needed (joint pain).   Calcium 1000 + D 1000-20 MG-MCG Tabs Generic drug: Calcium Carb-Cholecalciferol Take 1 tablet by mouth daily at 12 noon.   cycloSPORINE 0.05 % ophthalmic emulsion Commonly known as: RESTASIS Place 1 drop into both eyes 2 (two) times daily.   D 1000 25 MCG (1000 UT) capsule Generic drug: Cholecalciferol Take 1,000 Units by mouth in the morning.   Eliquis DVT/PE Starter Pack Generic drug: Apixaban Starter Pack (10mg  and 5mg ) Take as directed on package: start with two-5mg  tablets twice daily for 7 days. On day 8, switch to one-5mg  tablet twice daily.   eszopiclone 1 MG Tabs tablet Commonly known as: LUNESTA Take 1 mg by mouth at bedtime.   metFORMIN 500 MG 24 hr tablet Commonly known as: GLUCOPHAGE-XR Take 500 mg by mouth at bedtime.   metoprolol  tartrate 50 MG tablet Commonly known as: LOPRESSOR  Take 1 tablet (50 mg total) by mouth 2 (two) times daily. What changed:  medication strength how much to take when to take this   omeprazole 40 MG capsule Commonly known as: PRILOSEC Take 40 mg by mouth daily before breakfast.   tiZANidine 2 MG tablet Commonly known as: ZANAFLEX Take 2 mg by mouth every 8 (eight) hours as needed.   triamterene-hydrochlorothiazide 37.5-25 MG  capsule Commonly known as: DYAZIDE Take 1 capsule by mouth in the morning.        Allergies  Allergen Reactions   Erythromycin Diarrhea    You were cared for by a hospitalist during your hospital stay. If you have any questions about your discharge medications or the care you received while you were in the hospital after you are discharged, you can call the unit and asked to speak with the hospitalist on call if the hospitalist that took care of you is not available. Once you are discharged, your primary care physician will handle any further medical issues. Please note that no refills for any discharge medications will be authorized once you are discharged, as it is imperative that you return to your primary care physician (or establish a relationship with a primary care physician if you do not have one) for your aftercare needs so that they can reassess your need for medications and monitor your lab values.  You were cared for by a hospitalist during your hospital stay. If you have any questions about your discharge medications or the care you received while you were in the hospital after you are discharged, you  can call the unit and asked to speak with the hospitalist on call if the hospitalist that took care of you is not available. Once you are discharged, your primary care physician will handle any further medical issues. Please note that NO REFILLS for any discharge medications will be authorized once you are discharged, as it is imperative that you return to your primary care physician (or establish a relationship with a primary care physician if you do not have one) for your aftercare needs so that they can reassess your need for medications and monitor your lab values.  Please request your Prim.MD to go over all Hospital Tests and Procedure/Radiological results at the follow up, please get all Hospital records sent to your Prim MD by signing hospital release before you go home.  Get CBC,  CMP, 2 view Chest X ray checked  by Primary MD during your next visit or SNF MD in 5-7 days ( we routinely change or add medications that can affect your baseline labs and fluid status, therefore we recommend that you get the mentioned basic workup next visit with your PCP, your PCP may decide not to get them or add new tests based on their clinical decision)  On your next visit with your primary care physician please Get Medicines reviewed and adjusted.  If you experience worsening of your admission symptoms, develop shortness of breath, life threatening emergency, suicidal or homicidal thoughts you must seek medical attention immediately by calling 911 or calling your MD immediately  if symptoms less severe.  You Must read complete instructions/literature along with all the possible adverse reactions/side effects for all the Medicines you take and that have been prescribed to you. Take any new Medicines after you have completely understood and accpet all the possible adverse reactions/side effects.   Do not drive, operate heavy machinery, perform activities at heights, swimming or participation in water activities or provide baby sitting services if your were admitted for syncope or siezures until you have seen by Primary MD or a Neurologist and advised to do so again.  Do not drive when taking Pain medications.   Procedures/Studies: VAS US  LOWER EXTREMITY VENOUS (DVT) Result Date: 11/11/2023  Lower Venous DVT Study Patient Name:  KATEY Stebner  Date of Exam:   11/11/2023 Medical Rec #: [id number]   Accession #:    [id number] Date of Birth: [birth year]   Patient Gender: F Patient Age:   73 years Exam Location:  [facility] Procedure:      VAS US  LOWER EXTREMITY VENOUS (DVT) Referring Phys: BURGESS DARE --------------------------------------------------------------------------------  Indications: Pulmonary embolism.  Risk Factors: DVT 2022. Comparison Study: No previous exams Performing Technologist:  Jody Hill RVT, RDMS  Examination Guidelines: A complete evaluation includes B-mode imaging, spectral Doppler, color Doppler, and power Doppler as needed of all accessible portions of each vessel. Bilateral testing is considered an integral part of a complete examination. Limited examinations for reoccurring indications may be performed as noted. The reflux portion of the exam is performed with the patient in reverse Trendelenburg.  +---------+---------------+---------+-----------+----------+--------------+ RIGHT    CompressibilityPhasicitySpontaneityPropertiesThrombus Aging +---------+---------------+---------+-----------+----------+--------------+ CFV      Full           Yes      Yes                                 +---------+---------------+---------+-----------+----------+--------------+ SFJ      Full                                                        +---------+---------------+---------+-----------+----------+--------------+  FV Prox  Full           Yes      Yes                                 +---------+---------------+---------+-----------+----------+--------------+ FV Mid   Full           Yes      Yes                                 +---------+---------------+---------+-----------+----------+--------------+ FV DistalFull           Yes      Yes                                 +---------+---------------+---------+-----------+----------+--------------+ PFV      Full                                                        +---------+---------------+---------+-----------+----------+--------------+ POP      Full           Yes      Yes                                 +---------+---------------+---------+-----------+----------+--------------+ PTV      Full                                                        +---------+---------------+---------+-----------+----------+--------------+ PERO     Full                                                         +---------+---------------+---------+-----------+----------+--------------+   +---------+---------------+---------+-----------+----------+--------------+ LEFT     CompressibilityPhasicitySpontaneityPropertiesThrombus Aging +---------+---------------+---------+-----------+----------+--------------+ CFV      Full           Yes      Yes                                 +---------+---------------+---------+-----------+----------+--------------+ SFJ      Full                                                        +---------+---------------+---------+-----------+----------+--------------+ FV Prox  Full           Yes      Yes                                 +---------+---------------+---------+-----------+----------+--------------+ FV Mid   Full  Yes      Yes                                 +---------+---------------+---------+-----------+----------+--------------+ FV DistalFull           Yes      Yes                                 +---------+---------------+---------+-----------+----------+--------------+ PFV      Full                                                        +---------+---------------+---------+-----------+----------+--------------+ POP      Full           Yes      Yes                                 +---------+---------------+---------+-----------+----------+--------------+ PTV      Full                                                        +---------+---------------+---------+-----------+----------+--------------+ PERO     Full                                                        +---------+---------------+---------+-----------+----------+--------------+     Summary: BILATERAL: - No evidence of deep vein thrombosis seen in the lower extremities, bilaterally. -No evidence of popliteal cyst, bilaterally.   *See table(s) above for measurements and observations. Electronically signed by Lonni Gaskins MD on 11/11/2023 at  4:01:34 PM.    Final    ECHOCARDIOGRAM COMPLETE Result Date: 11/11/2023    ECHOCARDIOGRAM REPORT   Patient Name:   Ovida Grange Date of Exam: 11/11/2023 Medical Rec #:  [id number]  Height:       66.0 in Accession #:    [id number] Weight:       187.4 lb Date of Birth:  10-06-1950  BSA:          1.945 m Patient Age:    73 years   BP:           116/78 mmHg Patient Gender: F          HR:           82 bpm. Exam Location:  Inpatient Procedure: 2D Echo (Both Spectral and Color Flow Doppler were utilized during            procedure). Indications:    Afib  History:        Patient has no prior history of Echocardiogram examinations.                 Arrythmias:Atrial Fibrillation.  Sonographer:    Norleen Amour Referring Phys: 60 ARSHAD N KAKRAKANDY IMPRESSIONS  1. Left ventricular ejection fraction, by estimation, is 70 to  75%. The left ventricle has hyperdynamic function. The left ventricle has no regional wall motion abnormalities. Left ventricular diastolic parameters are indeterminate.  2. Right ventricular systolic function is normal. The right ventricular size is normal.  3. Left atrial size was mildly dilated.  4. The mitral valve is normal in structure. No evidence of mitral valve regurgitation. No evidence of mitral stenosis.  5. The aortic valve is tricuspid. Aortic valve regurgitation is not visualized. No aortic stenosis is present.  6. There is mild (Grade II) plaque involving the ascending aorta.  7. The inferior vena cava is normal in size with greater than 50% respiratory variability, suggesting right atrial pressure of 3 mmHg. FINDINGS  Left Ventricle: Left ventricular ejection fraction, by estimation, is 70 to 75%. The left ventricle has hyperdynamic function. The left ventricle has no regional wall motion abnormalities. The left ventricular internal cavity size was normal in size. There is no left ventricular hypertrophy. Left ventricular diastolic function could not be evaluated due to atrial  fibrillation. Left ventricular diastolic parameters are indeterminate. Right Ventricle: The right ventricular size is normal. No increase in right ventricular wall thickness. Right ventricular systolic function is normal. Left Atrium: Left atrial size was mildly dilated. Right Atrium: Right atrial size was normal in size. Pericardium: There is no evidence of pericardial effusion. Mitral Valve: The mitral valve is normal in structure. No evidence of mitral valve regurgitation. No evidence of mitral valve stenosis. Tricuspid Valve: The tricuspid valve is normal in structure. Tricuspid valve regurgitation is not demonstrated. No evidence of tricuspid stenosis. Aortic Valve: The aortic valve is tricuspid. Aortic valve regurgitation is not visualized. No aortic stenosis is present. Pulmonic Valve: The pulmonic valve was normal in structure. Pulmonic valve regurgitation is not visualized. No evidence of pulmonic stenosis. Aorta: The aortic root is normal in size and structure. There is mild (Grade II) plaque involving the ascending aorta. Venous: The inferior vena cava is normal in size with greater than 50% respiratory variability, suggesting right atrial pressure of 3 mmHg. IAS/Shunts: No atrial level shunt detected by color flow Doppler.  LEFT VENTRICLE PLAX 2D LVIDd:         4.20 cm     Diastology LVIDs:         3.00 cm     LV e' medial:  8.20 cm/s LV PW:         1.10 cm     LV e' lateral: 10.60 cm/s LV IVS:        0.90 cm LVOT diam:     2.10 cm LV SV:         50 LV SV Index:   26 LVOT Area:     3.46 cm  LV Volumes (MOD) LV vol d, MOD A2C: 40.4 ml LV vol d, MOD A4C: 53.8 ml LV vol s, MOD A2C: 12.7 ml LV vol s, MOD A4C: 12.6 ml LV SV MOD A2C:     27.7 ml LV SV MOD A4C:     53.8 ml LV SV MOD BP:      34.1 ml RIGHT VENTRICLE             IVC RV Basal diam:  3.00 cm     IVC diam: 1.50 cm RV S prime:     16.20 cm/s TAPSE (M-mode): 1.7 cm LEFT ATRIUM             Index        RIGHT ATRIUM  Index LA diam:        3.90  cm 2.00 cm/m   RA Area:     13.20 cm LA Vol (A2C):   56.8 ml 29.20 ml/m  RA Volume:   29.40 ml  15.11 ml/m LA Vol (A4C):   58.2 ml 29.92 ml/m LA Biplane Vol: 59.0 ml 30.33 ml/m  AORTIC VALVE             PULMONIC VALVE LVOT Vmax:   87.50 cm/s  PV Vmax:       0.67 m/s LVOT Vmean:  61.800 cm/s PV Peak grad:  1.8 mmHg LVOT VTI:    0.145 m  AORTA Ao Root diam: 3.20 cm Ao Asc diam:  2.70 cm  SHUNTS Systemic VTI:  0.14 m Systemic Diam: 2.10 cm Annabella Scarce MD Electronically signed by Annabella Scarce MD Signature Date/Time: 11/11/2023/12:06:42 PM    Final    CT Angio Chest Pulmonary Embolism (PE) W or WO Contrast Result Date: 11/11/2023 CLINICAL DATA:  Suspected pulmonary embolism. EXAM: CT ANGIOGRAPHY CHEST WITH CONTRAST TECHNIQUE: Multidetector CT imaging of the chest was performed using the standard protocol during bolus administration of intravenous contrast. Multiplanar CT image reconstructions and MIPs were obtained to evaluate the vascular anatomy. RADIATION DOSE REDUCTION: This exam was performed according to the departmental dose-optimization program which includes automated exposure control, adjustment of the mA and/or kV according to patient size and/or use of iterative reconstruction technique. CONTRAST:  75mL OMNIPAQUE  IOHEXOL  350 MG/ML SOLN COMPARISON:  June 14, 2021 and October 28, 2019 FINDINGS: Cardiovascular: Satisfactory opacification of the pulmonary arteries to the segmental level. A moderate amount of intraluminal low attenuation is seen within the distal aspect of the right pulmonary artery. This extends to involve the segmental and subsegmental right lower lobe branches of the right pulmonary artery. A mild amount of intraluminal low attenuation is seen within two subsegmental lower lobe branches of the left pulmonary artery. Normal heart size without evidence of right heart strain (RV/LV ratio of approximately 0.94). No pericardial effusion. Mediastinum/Nodes: There is mild right hilar  lymphadenopathy. Thyroid  gland, trachea, and esophagus demonstrate no significant findings. Lungs/Pleura: Mild atelectatic changes are seen along the posterior aspect of the bilateral lower lobes. No acute infiltrate, pleural effusion or pneumothorax is identified. Upper Abdomen: Multiple surgical clips are seen within the gallbladder fossa. A stable 4.0 cm low-attenuation (approximately 11.95 Hounsfield units) right adrenal mass is seen. Musculoskeletal: Multilevel degenerative changes are seen throughout the thoracic spine Review of the MIP images confirms the above findings. IMPRESSION: 1. Moderate amount of pulmonary embolism within the distal aspect of the right pulmonary artery, extending to involve the segmental and subsegmental right lower lobe branches of the right pulmonary artery. 2. Mild amount of pulmonary embolism within two subsegmental lower lobe branches of the left pulmonary artery. 3. Mild right hilar lymphadenopathy, likely reactive. 4. Stable right adrenal adenoma. Electronically Signed   By: Suzen Dials M.D.   On: 11/11/2023 03:02   DG Chest Port 1 View Result Date: 11/10/2023 EXAM: 1 VIEW(S) XRAY OF THE CHEST 11/10/2023 09:30:00 PM COMPARISON: 05/01/2023 CLINICAL HISTORY: chest pain FINDINGS: LUNGS AND PLEURA: Left base atelectasis. Biapical scarring. No pulmonary edema. No pleural effusion. No pneumothorax. HEART AND MEDIASTINUM: No acute abnormality of the cardiac and mediastinal silhouettes. BONES AND SOFT TISSUES: No acute osseous abnormality. IMPRESSION: 1. No acute cardiopulmonary process identified. 2. Left basilar atelectasis and biapical scarring, unchanged from prior study. Electronically signed by: Franky Crease MD 11/10/2023 09:33 PM EST RP  Workstation: [id number]     The results of significant diagnostics from this hospitalization (including imaging, microbiology, ancillary and laboratory) are listed below for reference.     Microbiology: No results found for this  or any previous visit (from the past 240 hours).   Labs: BNP (last 3 results) Recent Labs    11/10/23 2317  BNP 342.8*   Basic Metabolic Panel: Recent Labs  Lab 11/10/23 2038 11/12/23 0421  NA 139 141  K 3.9 3.4*  CL 106 110  CO2 23 22  GLUCOSE 126* 110*  BUN 28* 15  CREATININE 1.29* 0.87  CALCIUM 8.8* 8.2*  MG 2.0 2.1   Liver Function Tests: Recent Labs  Lab 11/10/23 2038  AST 32  ALT 20  ALKPHOS 72  BILITOT 1.0  PROT 5.9*  ALBUMIN 3.3*   No results for input(s): LIPASE, AMYLASE in the last 168 hours. No results for input(s): AMMONIA in the last 168 hours. CBC: Recent Labs  Lab 11/10/23 2038 11/12/23 0421  WBC 18.5* 12.4*  NEUTROABS 11.8*  --   HGB 15.0 13.8  HCT 46.9* 41.9  MCV 85.4 83.8  PLT 284 229   Cardiac Enzymes: No results for input(s): CKTOTAL, CKMB, CKMBINDEX, TROPONINI in the last 168 hours. BNP: Invalid input(s): POCBNP CBG: Recent Labs  Lab 11/11/23 0715 11/11/23 1138 11/11/23 1611 11/11/23 2054 11/12/23 0728  GLUCAP 116* 139* 146* 177* 122*   D-Dimer Recent Labs    11/10/23 2317  DDIMER 2.49*   Hgb A1c Recent Labs    11/10/23 2038  HGBA1C 6.1*   Lipid Profile No results for input(s): CHOL, HDL, LDLCALC, TRIG, CHOLHDL, LDLDIRECT in the last 72 hours. Thyroid  function studies Recent Labs    11/10/23 2325  TSH 1.888   Anemia work up No results for input(s): VITAMINB12, FOLATE, FERRITIN, TIBC, IRON, RETICCTPCT in the last 72 hours. Urinalysis No results found for: COLORURINE, APPEARANCEUR, LABSPEC, PHURINE, GLUCOSEU, HGBUR, BILIRUBINUR, KETONESUR, PROTEINUR, UROBILINOGEN, NITRITE, LEUKOCYTESUR Sepsis Labs Recent Labs  Lab 11/10/23 2038 11/12/23 0421  WBC 18.5* 12.4*   Microbiology No results found for this or any previous visit (from the past 240 hours).   Time coordinating discharge:  I have spent 35 minutes face to face with the patient and on  the ward discussing the patients care, assessment, plan and disposition with other care givers. >50% of the time was devoted counseling the patient about the risks and benefits of treatment/Discharge disposition and coordinating care.   SIGNED:   Burgess JAYSON Dare, MD  [facility] 11/12/2023, 12:21 PM   If 7PM-7AM, please contact night-coverage

## 2023-11-12 NOTE — Plan of Care (Signed)

## 2023-11-12 NOTE — Progress Notes (Signed)
 Cardiologist:  Stacy  Subjective:  Denies SSCP, palpitations or Dyspnea Has converted to NSR   Objective:  Vitals:   11/11/23 2000 11/11/23 2026 11/11/23 2300 11/12/23 0423  BP: 101/85 101/85 106/69 125/80  Pulse:  (!) 134 71   Resp:   20 20  Temp:   98 F (36.7 C) 97.9 F (36.6 C)  TempSrc:   Oral Oral  SpO2:    100%  Weight:      Height:        Intake/Output from previous day:  Intake/Output Summary (Last 24 hours) at 11/12/2023 0725 Last data filed at 11/12/2023 0424 Gross per 24 hour  Intake 1247.37 ml  Output --  Net 1247.37 ml    Physical Exam: Affect appropriate Healthy:  appears stated age HEENT: normal Neck supple with no adenopathy JVP normal no bruits no thyromegaly Lungs clear with no wheezing and good diaphragmatic motion Heart:  S1/S2 no murmur, no rub, gallop or click PMI normal Abdomen: benighn, BS positve, no tenderness, no AAA no bruit.  No HSM or HJR Distal pulses intact with no bruits No edema Neuro non-focal Skin warm and dry No muscular weakness   Lab Results: Basic Metabolic Panel: Recent Labs    11/10/23 2038 11/12/23 0421  NA 139 141  K 3.9 3.4*  CL 106 110  CO2 23 22  GLUCOSE 126* 110*  BUN 28* 15  CREATININE 1.29* 0.87  CALCIUM 8.8* 8.2*  MG 2.0 2.1   Liver Function Tests: Recent Labs    11/10/23 2038  AST 32  ALT 20  ALKPHOS 72  BILITOT 1.0  PROT 5.9*  ALBUMIN 3.3*   No results for input(s): LIPASE, AMYLASE in the last 72 hours. CBC: Recent Labs    11/10/23 2038 11/12/23 0421  WBC 18.5* 12.4*  NEUTROABS 11.8*  --   HGB 15.0 13.8  HCT 46.9* 41.9  MCV 85.4 83.8  PLT 284 229    D-Dimer: Recent Labs    11/10/23 2317  DDIMER 2.49*   Hemoglobin A1C: Recent Labs    11/10/23 2038  HGBA1C 6.1*    Thyroid  Function Tests: Recent Labs    11/10/23 2325  TSH 1.888    Imaging: VAS US  LOWER EXTREMITY VENOUS (DVT) Result Date: 11/11/2023  Lower Venous DVT Study Patient Name:  MALITA Halberg   Date of Exam:   11/11/2023 Medical Rec #: [id number]   Accession #:    [id number] Date of Birth: 10/28/1950   Patient Gender: F Patient Age:   73 years Exam Location:  [facility] Procedure:      VAS US  LOWER EXTREMITY VENOUS (DVT) Referring Phys: BURGESS DARE --------------------------------------------------------------------------------  Indications: Pulmonary embolism.  Risk Factors: DVT 2022. Comparison Study: No previous exams Performing Technologist: Jody Hill RVT, RDMS  Examination Guidelines: A complete evaluation includes B-mode imaging, spectral Doppler, color Doppler, and power Doppler as needed of all accessible portions of each vessel. Bilateral testing is considered an integral part of a complete examination. Limited examinations for reoccurring indications may be performed as noted. The reflux portion of the exam is performed with the patient in reverse Trendelenburg.  +---------+---------------+---------+-----------+----------+--------------+ RIGHT    CompressibilityPhasicitySpontaneityPropertiesThrombus Aging +---------+---------------+---------+-----------+----------+--------------+ CFV      Full           Yes      Yes                                 +---------+---------------+---------+-----------+----------+--------------+  SFJ      Full                                                        +---------+---------------+---------+-----------+----------+--------------+ FV Prox  Full           Yes      Yes                                 +---------+---------------+---------+-----------+----------+--------------+ FV Mid   Full           Yes      Yes                                 +---------+---------------+---------+-----------+----------+--------------+ FV DistalFull           Yes      Yes                                 +---------+---------------+---------+-----------+----------+--------------+ PFV      Full                                                         +---------+---------------+---------+-----------+----------+--------------+ POP      Full           Yes      Yes                                 +---------+---------------+---------+-----------+----------+--------------+ PTV      Full                                                        +---------+---------------+---------+-----------+----------+--------------+ PERO     Full                                                        +---------+---------------+---------+-----------+----------+--------------+   +---------+---------------+---------+-----------+----------+--------------+ LEFT     CompressibilityPhasicitySpontaneityPropertiesThrombus Aging +---------+---------------+---------+-----------+----------+--------------+ CFV      Full           Yes      Yes                                 +---------+---------------+---------+-----------+----------+--------------+ SFJ      Full                                                        +---------+---------------+---------+-----------+----------+--------------+  FV Prox  Full           Yes      Yes                                 +---------+---------------+---------+-----------+----------+--------------+ FV Mid   Full           Yes      Yes                                 +---------+---------------+---------+-----------+----------+--------------+ FV DistalFull           Yes      Yes                                 +---------+---------------+---------+-----------+----------+--------------+ PFV      Full                                                        +---------+---------------+---------+-----------+----------+--------------+ POP      Full           Yes      Yes                                 +---------+---------------+---------+-----------+----------+--------------+ PTV      Full                                                         +---------+---------------+---------+-----------+----------+--------------+ PERO     Full                                                        +---------+---------------+---------+-----------+----------+--------------+     Summary: BILATERAL: - No evidence of deep vein thrombosis seen in the lower extremities, bilaterally. -No evidence of popliteal cyst, bilaterally.   *See table(s) above for measurements and observations. Electronically signed by Lonni Gaskins MD on 11/11/2023 at 4:01:34 PM.    Final    ECHOCARDIOGRAM COMPLETE Result Date: 11/11/2023    ECHOCARDIOGRAM REPORT   Patient Name:   Jennetta Satcher Date of Exam: 11/11/2023 Medical Rec #:  [id number]  Height:       66.0 in Accession #:    [id number] Weight:       187.4 lb Date of Birth:  Apr 22, 1950  BSA:          1.945 m Patient Age:    73 years   BP:           116/78 mmHg Patient Gender: F          HR:           82 bpm. Exam Location:  Inpatient Procedure: 2D Echo (Both Spectral and Color Flow Doppler were utilized during  procedure). Indications:    Afib  History:        Patient has no prior history of Echocardiogram examinations.                 Arrythmias:Atrial Fibrillation.  Sonographer:    Norleen Amour Referring Phys: 21 ARSHAD N KAKRAKANDY IMPRESSIONS  1. Left ventricular ejection fraction, by estimation, is 70 to 75%. The left ventricle has hyperdynamic function. The left ventricle has no regional wall motion abnormalities. Left ventricular diastolic parameters are indeterminate.  2. Right ventricular systolic function is normal. The right ventricular size is normal.  3. Left atrial size was mildly dilated.  4. The mitral valve is normal in structure. No evidence of mitral valve regurgitation. No evidence of mitral stenosis.  5. The aortic valve is tricuspid. Aortic valve regurgitation is not visualized. No aortic stenosis is present.  6. There is mild (Grade II) plaque involving the ascending aorta.  7. The inferior vena cava  is normal in size with greater than 50% respiratory variability, suggesting right atrial pressure of 3 mmHg. FINDINGS  Left Ventricle: Left ventricular ejection fraction, by estimation, is 70 to 75%. The left ventricle has hyperdynamic function. The left ventricle has no regional wall motion abnormalities. The left ventricular internal cavity size was normal in size. There is no left ventricular hypertrophy. Left ventricular diastolic function could not be evaluated due to atrial fibrillation. Left ventricular diastolic parameters are indeterminate. Right Ventricle: The right ventricular size is normal. No increase in right ventricular wall thickness. Right ventricular systolic function is normal. Left Atrium: Left atrial size was mildly dilated. Right Atrium: Right atrial size was normal in size. Pericardium: There is no evidence of pericardial effusion. Mitral Valve: The mitral valve is normal in structure. No evidence of mitral valve regurgitation. No evidence of mitral valve stenosis. Tricuspid Valve: The tricuspid valve is normal in structure. Tricuspid valve regurgitation is not demonstrated. No evidence of tricuspid stenosis. Aortic Valve: The aortic valve is tricuspid. Aortic valve regurgitation is not visualized. No aortic stenosis is present. Pulmonic Valve: The pulmonic valve was normal in structure. Pulmonic valve regurgitation is not visualized. No evidence of pulmonic stenosis. Aorta: The aortic root is normal in size and structure. There is mild (Grade II) plaque involving the ascending aorta. Venous: The inferior vena cava is normal in size with greater than 50% respiratory variability, suggesting right atrial pressure of 3 mmHg. IAS/Shunts: No atrial level shunt detected by color flow Doppler.  LEFT VENTRICLE PLAX 2D LVIDd:         4.20 cm     Diastology LVIDs:         3.00 cm     LV e' medial:  8.20 cm/s LV PW:         1.10 cm     LV e' lateral: 10.60 cm/s LV IVS:        0.90 cm LVOT diam:     2.10  cm LV SV:         50 LV SV Index:   26 LVOT Area:     3.46 cm  LV Volumes (MOD) LV vol d, MOD A2C: 40.4 ml LV vol d, MOD A4C: 53.8 ml LV vol s, MOD A2C: 12.7 ml LV vol s, MOD A4C: 12.6 ml LV SV MOD A2C:     27.7 ml LV SV MOD A4C:     53.8 ml LV SV MOD BP:      34.1 ml RIGHT VENTRICLE  IVC RV Basal diam:  3.00 cm     IVC diam: 1.50 cm RV S prime:     16.20 cm/s TAPSE (M-mode): 1.7 cm LEFT ATRIUM             Index        RIGHT ATRIUM           Index LA diam:        3.90 cm 2.00 cm/m   RA Area:     13.20 cm LA Vol (A2C):   56.8 ml 29.20 ml/m  RA Volume:   29.40 ml  15.11 ml/m LA Vol (A4C):   58.2 ml 29.92 ml/m LA Biplane Vol: 59.0 ml 30.33 ml/m  AORTIC VALVE             PULMONIC VALVE LVOT Vmax:   87.50 cm/s  PV Vmax:       0.67 m/s LVOT Vmean:  61.800 cm/s PV Peak grad:  1.8 mmHg LVOT VTI:    0.145 m  AORTA Ao Root diam: 3.20 cm Ao Asc diam:  2.70 cm  SHUNTS Systemic VTI:  0.14 m Systemic Diam: 2.10 cm Annabella Scarce MD Electronically signed by Annabella Scarce MD Signature Date/Time: 11/11/2023/12:06:42 PM    Final    CT Angio Chest Pulmonary Embolism (PE) W or WO Contrast Result Date: 11/11/2023 CLINICAL DATA:  Suspected pulmonary embolism. EXAM: CT ANGIOGRAPHY CHEST WITH CONTRAST TECHNIQUE: Multidetector CT imaging of the chest was performed using the standard protocol during bolus administration of intravenous contrast. Multiplanar CT image reconstructions and MIPs were obtained to evaluate the vascular anatomy. RADIATION DOSE REDUCTION: This exam was performed according to the departmental dose-optimization program which includes automated exposure control, adjustment of the mA and/or kV according to patient size and/or use of iterative reconstruction technique. CONTRAST:  75mL OMNIPAQUE  IOHEXOL  350 MG/ML SOLN COMPARISON:  June 14, 2021 and October 28, 2019 FINDINGS: Cardiovascular: Satisfactory opacification of the pulmonary arteries to the segmental level. A moderate amount of intraluminal  low attenuation is seen within the distal aspect of the right pulmonary artery. This extends to involve the segmental and subsegmental right lower lobe branches of the right pulmonary artery. A mild amount of intraluminal low attenuation is seen within two subsegmental lower lobe branches of the left pulmonary artery. Normal heart size without evidence of right heart strain (RV/LV ratio of approximately 0.94). No pericardial effusion. Mediastinum/Nodes: There is mild right hilar lymphadenopathy. Thyroid  gland, trachea, and esophagus demonstrate no significant findings. Lungs/Pleura: Mild atelectatic changes are seen along the posterior aspect of the bilateral lower lobes. No acute infiltrate, pleural effusion or pneumothorax is identified. Upper Abdomen: Multiple surgical clips are seen within the gallbladder fossa. A stable 4.0 cm low-attenuation (approximately 11.95 Hounsfield units) right adrenal mass is seen. Musculoskeletal: Multilevel degenerative changes are seen throughout the thoracic spine Review of the MIP images confirms the above findings. IMPRESSION: 1. Moderate amount of pulmonary embolism within the distal aspect of the right pulmonary artery, extending to involve the segmental and subsegmental right lower lobe branches of the right pulmonary artery. 2. Mild amount of pulmonary embolism within two subsegmental lower lobe branches of the left pulmonary artery. 3. Mild right hilar lymphadenopathy, likely reactive. 4. Stable right adrenal adenoma. Electronically Signed   By: Suzen Dials M.D.   On: 11/11/2023 03:02   DG Chest Port 1 View Result Date: 11/10/2023 EXAM: 1 VIEW(S) XRAY OF THE CHEST 11/10/2023 09:30:00 PM COMPARISON: 05/01/2023 CLINICAL HISTORY: chest pain FINDINGS: LUNGS AND PLEURA: Left  base atelectasis. Biapical scarring. No pulmonary edema. No pleural effusion. No pneumothorax. HEART AND MEDIASTINUM: No acute abnormality of the cardiac and mediastinal silhouettes. BONES AND SOFT  TISSUES: No acute osseous abnormality. IMPRESSION: 1. No acute cardiopulmonary process identified. 2. Left basilar atelectasis and biapical scarring, unchanged from prior study. Electronically signed by: Franky Crease MD 11/10/2023 09:33 PM EST RP Workstation: [id number]    Cardiac Studies:  ECG: afib nonspecific ST changes    Telemetry:  NsR rates 60's   Echo: normal RV/LV EF no significant valve dx  Medications:    apixaban  10 mg Oral BID   Followed by   NOREEN ON 11/17/2023] apixaban  5 mg Oral BID   atorvastatin  20 mg Oral Daily   insulin aspart  0-9 Units Subcutaneous TID WC   metoprolol  tartrate  50 mg Oral BID   pantoprazole  40 mg Oral Daily      sodium chloride  75 mL/hr at 11/12/23 0424   diltiazem (CARDIZEM) infusion Stopped (11/11/23 1127)    Assessment/Plan:   PAF:  in setting of PE Has already converted CHADVASC 4 on PE dose eliquis for 6 months IV cardizem off. No need for oral calcium blocker continue lopressor  50 mg bid. Will arrange outpatient f/u with Dr Stacy Clayton to d/c home from cardiology stand point  [facility] 11/12/2023, 7:25 AM

## 2023-11-12 NOTE — Discharge Instructions (Addendum)
 Information on my medicine - ELIQUIS (apixaban)  This medication education was reviewed with me or my healthcare representative as part of my discharge preparation.  The pharmacist that spoke with me during my hospital stay was:  Ozell ONEIDA [place], [facility]  Why was Eliquis prescribed for you? Eliquis was prescribed to treat blood clots that may have been found in the veins of your legs (deep vein thrombosis) or in your lungs (pulmonary embolism) and to reduce the risk of them occurring again.  What do You need to know about Eliquis ? The starting dose is 10 mg (two 5 mg tablets) taken TWICE daily for the FIRST SEVEN (7) DAYS, then on (enter date)  11/17/2023 evening  the dose is reduced to ONE 5 mg tablet taken TWICE daily.  Eliquis may be taken with or without food.   Try to take the dose about the same time in the morning and in the evening. If you have difficulty swallowing the tablet whole please discuss with your pharmacist how to take the medication safely.  Take Eliquis exactly as prescribed and DO NOT stop taking Eliquis without talking to the doctor who prescribed the medication.  Stopping may increase your risk of developing a new blood clot.  Refill your prescription before you run out.  After discharge, you should have regular check-up appointments with your healthcare provider that is prescribing your Eliquis.    What do you do if you miss a dose? If a dose of ELIQUIS is not taken at the scheduled time, take it as soon as possible on the same day and twice-daily administration should be resumed. The dose should not be doubled to make up for a missed dose.  Important Safety Information A possible side effect of Eliquis is bleeding. You should call your healthcare provider right away if you experience any of the following: Bleeding from an injury or your nose that does not stop. Unusual colored urine (red or dark brown) or unusual colored stools (red or black). Unusual  bruising for unknown reasons. A serious fall or if you hit your head (even if there is no bleeding).  Some medicines may interact with Eliquis and might increase your risk of bleeding or clotting while on Eliquis. To help avoid this, consult your healthcare provider or pharmacist prior to using any new prescription or non-prescription medications, including herbals, vitamins, non-steroidal anti-inflammatory drugs (NSAIDs) and supplements.  This website has more information on Eliquis (apixaban): http://www.eliquis.com/eliquis/home

## 2023-11-13 ENCOUNTER — Telehealth: Payer: Self-pay

## 2023-11-13 NOTE — Transitions of Care (Post Inpatient/ED Visit) (Signed)
 11/13/2023  Name: Stacy Clayton MRN: [id number] DOB: [birth year]  Today's TOC FU Call Status: Today's TOC FU Call Status:: Successful TOC FU Call Completed TOC FU Call Complete Date: 11/13/23 Patient's Name and Date of Birth confirmed.  Transition Care Management Follow-up Telephone Call Date of Discharge: 11/12/23 Discharge Facility: [facility]) Type of Discharge: Inpatient Admission Primary Inpatient Discharge Diagnosis:: Acute Pulmonary Embolism How have you been since you were released from the hospital?: Same Any questions or concerns?: No  Items Reviewed: Did you receive and understand the discharge instructions provided?: Yes Medications obtained,verified, and reconciled?: Yes (Medications Reviewed) Any new allergies since your discharge?: No Dietary orders reviewed?: Yes Type of Diet Ordered:: healthy Do you have support at home?: Yes People in Home [RPT]: friend(s), child(ren), adult  Medications Reviewed Today: Medications Reviewed Today     Reviewed by Rumalda Alan PENNER, RN (Registered Nurse) on 11/13/23 at 1117  Med List Status: <None>   Medication Order Taking? Sig Documenting Provider Last Dose Status Informant  acetaminophen  (TYLENOL ) 500 MG tablet [id number] Yes Take 500 mg by mouth every 6 (six) hours as needed for mild pain (pain score 1-3) or headache. [provider]  Active Self, Pharmacy Records  APIXABAN WINN) VTE STARTER PACK (10MG  AND 5MG ) [id number] Yes Take as directed on package: start with two-5mg  tablets twice daily for 7 days. On day 8, switch to one-5mg  tablet twice daily. Caleen Burgess BROCKS, MD  Active   aspirin EC 81 MG tablet [id number] Yes Take 81 mg by mouth daily. Swallow whole. [provider]  Active Self, Pharmacy Records  atorvastatin (LIPITOR) 20 MG tablet [id number] Yes Take 20 mg by mouth at bedtime. [provider]  Active Self, Pharmacy Records  Calcium Carb-Cholecalciferol (CALCIUM 1000 + D) 1000-20 MG-MCG TABS  [id number] Yes Take 1 tablet by mouth daily at 12 noon. [provider]  Active Self, Pharmacy Records  Cholecalciferol (D 1000) 25 MCG (1000 UT) capsule [id number] Yes Take 1,000 Units by mouth in the morning. [provider]  Active Self, Pharmacy Records  cycloSPORINE (RESTASIS) 0.05 % ophthalmic emulsion [id number] Yes Place 1 drop into both eyes 2 (two) times daily. [provider]  Active Self, Pharmacy Records  eszopiclone (LUNESTA) 1 MG TABS tablet [id number] Yes Take 1 mg by mouth at bedtime. [provider]  Active Self, Pharmacy Records  Menthol, Topical Analgesic, (BIOFREEZE ROLL-ON EX) [id number] Yes Apply 1 Application topically 3 (three) times daily as needed (joint pain). [provider]  Active Self, Pharmacy Records  metFORMIN (GLUCOPHAGE-XR) 500 MG 24 hr tablet [id number] Yes Take 500 mg by mouth at bedtime. [provider]  Active Self, Pharmacy Records  metoprolol  tartrate (LOPRESSOR ) 50 MG tablet [id number] Yes Take 1 tablet (50 mg total) by mouth 2 (two) times daily. Caleen Burgess BROCKS, MD  Active   omeprazole (PRILOSEC) 40 MG capsule [id number] Yes Take 40 mg by mouth daily before breakfast. [provider]  Active Self, Pharmacy Records  tiZANidine (ZANAFLEX) 2 MG tablet [id number]  Take 2 mg by mouth every 8 (eight) hours as needed.  Patient not taking: Reported on 11/13/2023   [provider]  Active Self, Pharmacy Records           Med Note (LEE, NICOLE   Wed Nov 11, 2023  1:59 AM) Pt. Keeps on hand  triamterene-hydrochlorothiazide (DYAZIDE) 37.5-25 MG capsule [id number] Yes Take 1 capsule by mouth in the morning. [provider]  Active Self, Pharmacy Records  Home Care and Equipment/Supplies: Were Home Health Services Ordered?: No Any new equipment or medical supplies ordered?: No  Functional Questionnaire: Do you need assistance with bathing/showering or dressing?: No Do you need  assistance with meal preparation?: No Do you need assistance with eating?: No Do you have difficulty maintaining continence: No Do you need assistance with getting out of bed/getting out of a chair/moving?: No Do you have difficulty managing or taking your medications?: No  Follow up appointments reviewed: PCP Follow-up appointment confirmed?: No MD Provider Line Number:[phone] Given: No Specialist Hospital Follow-up appointment confirmed?: Yes Date of Specialist follow-up appointment?: 11/18/23 Follow-Up Specialty Provider:: Dr. Monetta Do you need transportation to your follow-up appointment?: No Do you understand care options if your condition(s) worsen?: Yes-patient verbalized understanding  SDOH Interventions Today    Flowsheet Row Most Recent Value  SDOH Interventions   Food Insecurity Interventions Intervention Not Indicated  Housing Interventions Intervention Not Indicated  Transportation Interventions Intervention Not Indicated  Utilities Interventions Intervention Not Indicated   Today's Vitals   11/13/23 1123 11/13/23 1125  Weight: 182 lb (82.6 kg)   PainSc: 0-No pain 0-No pain   Patient reports that she is doing well. Reports that she has a friend who checked her today and she is still in NSR.  Denies any active bleeding. Reports that she has a good support system.   Reviewed all discharge instructions, medications and need for follow up. Reviewed fall prevention and bleed precautions.  Reviewed when to seek emergency call.  Reviewed importance of not missing any of her medications. Reviewed need for followup with PCP and patient will call today and schedule. Reviewed and confirmed patient is aware of cardiology follow up for next week. Verbalizes she has transportation to appointnment. Reviewed A fib and when to call 911.   Reviewed and offered 30 day TOC program and patient declined. Provided my contact information for patient to call me if needed.  Alan Ee, RN, BSN, CEN [employer]- Transition of Care Team.  [facility] [phone]

## 2023-11-16 DIAGNOSIS — E669 Obesity, unspecified: Secondary | ICD-10-CM | POA: Insufficient documentation

## 2023-11-16 DIAGNOSIS — Z6829 Body mass index (BMI) 29.0-29.9, adult: Secondary | ICD-10-CM | POA: Diagnosis not present

## 2023-11-16 DIAGNOSIS — E1142 Type 2 diabetes mellitus with diabetic polyneuropathy: Secondary | ICD-10-CM | POA: Diagnosis not present

## 2023-11-16 DIAGNOSIS — Z7689 Persons encountering health services in other specified circumstances: Secondary | ICD-10-CM | POA: Diagnosis not present

## 2023-11-16 DIAGNOSIS — E278 Other specified disorders of adrenal gland: Secondary | ICD-10-CM | POA: Insufficient documentation

## 2023-11-16 DIAGNOSIS — I480 Paroxysmal atrial fibrillation: Secondary | ICD-10-CM | POA: Diagnosis not present

## 2023-11-16 DIAGNOSIS — I2699 Other pulmonary embolism without acute cor pulmonale: Secondary | ICD-10-CM | POA: Diagnosis not present

## 2023-11-17 NOTE — Progress Notes (Unsigned)
 Cardiology Office Note:    Date:  11/18/2023   ID:  Stacy Clayton, DOB 23-Jan-1950, MRN [id number]  PCP:  Clemmie Nest, MD  Cardiologist:  Redell Leiter, MD    Referring MD: Clemmie Nest, MD    ASSESSMENT:    1. Paroxysmal atrial fibrillation (HCC)   2. Chronic anticoagulation   3. Acute pulmonary embolism without acute cor pulmonale, unspecified pulmonary embolism type (HCC)   4. Primary hypertension   5. Angina pectoris    PLAN:    In order of problems listed above:  Occurred in the context of pulmonary embolism no recurrence converted spontaneously should remain on long-term anticoagulation and continue beta-blocker Had typical anginal discomfort we will recheck cardiac CTA Well-controlled continue diuretic beta-blocker   Next appointment: 6 months   Medication Adjustments/Labs and Tests Ordered: Current medicines are reviewed at length with the patient today.  Concerns regarding medicines are outlined above.  Orders Placed This Encounter  Procedures   EKG 12-Lead   No orders of the defined types were placed in this encounter.    History of Present Illness:    Stacy Clayton is a 73 y.o. female with a hx of recent hospitalization with atrial fibrillation in the context of bilateral pulmonary embolism.  She was last seen by cardiology during her hospitalization 1 week ago and converted spontaneously to sinus rhythm .I had seen her 2023 history of hypertension hyperlipidemia chest pain she subsequent underwent a cardiac CTA which showed a calcium score of 0 normal coronary arteries normal aorta and over read of the chest showed stable hilar adenopathy and hepatic steatosis. 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 Previously she had normal coronary arteriography and a calcium score of 0 I think she can go repeat CTA that is been ordered. Term she needs to remain and anticoagulated indefinitely.  If recurrent atrial fibrillation outside of these events occurs an antiarrhythmic drug for A-fib ablation. Past Medical History:  Diagnosis Date   Benign hypertension 02/29/2020   Cancer [facility])    DVT, lower extremity, distal, acute, right (HCC) 12/12/2020   GERD without esophagitis    Migraine 01/25/2019   PONV (postoperative nausea and vomiting)    PONV (postoperative nausea and vomiting) 05/24/2021    Current Medications: Current Meds  Medication Sig   acetaminophen  (TYLENOL ) 500 MG tablet Take 500 mg by mouth every 6 (six) hours as needed for mild pain (pain score 1-3) or headache.   aspirin EC 81 MG tablet Take 81 mg by mouth daily. Swallow whole.   atorvastatin (LIPITOR) 20 MG tablet Take 20 mg by mouth at bedtime.   Calcium Carb-Cholecalciferol (CALCIUM 1000 + D) 1000-20 MG-MCG TABS Take 1 tablet by mouth daily at 12 noon.   Cholecalciferol (D 1000) 25 MCG (1000 UT) capsule Take 1,000 Units by mouth in the morning.   cycloSPORINE (RESTASIS) 0.05 % ophthalmic emulsion Place 1 drop into both eyes 2 (two) times daily.   ELIQUIS 5 MG TABS tablet Take 5 mg by mouth 2 (two) times daily.   eszopiclone (LUNESTA) 1 MG TABS tablet Take 1 mg by mouth at bedtime.   Menthol, Topical Analgesic, (BIOFREEZE ROLL-ON EX) Apply 1 Application topically 3 (three) times daily as  needed (joint pain).   metFORMIN (GLUCOPHAGE-XR) 500 MG 24 hr tablet Take 500 mg by mouth at bedtime.   metoprolol  tartrate (LOPRESSOR ) 50 MG tablet Take 1 tablet (50 mg total) by mouth 2 (two) times daily.   omeprazole (PRILOSEC) 40 MG capsule Take 40 mg by mouth daily before breakfast.   tiZANidine (ZANAFLEX) 2 MG tablet Take 2 mg by mouth every 8 (eight) hours as needed.   triamterene-hydrochlorothiazide  (DYAZIDE) 37.5-25 MG capsule Take 1 capsule by mouth in the morning.      EKGs/Labs/Other Studies Reviewed:    The following studies were reviewed today:  Cardiac Studies & Procedures   ______________________________________________________________________________________________   STRESS TESTS  MYOCARDIAL PERFUSION IMAGING 03/28/2021   ECHOCARDIOGRAM  ECHOCARDIOGRAM COMPLETE 11/11/2023  Narrative ECHOCARDIOGRAM REPORT    Patient Name:   Stacy Clayton Date of Exam: 11/11/2023 Medical Rec #:  [id number]  Height:       66.0 in Accession #:    [id number] Weight:       187.4 lb Date of Birth:  05-07-1950  BSA:          1.945 m Patient Age:    73 years   BP:           116/78 mmHg Patient Gender: F          HR:           82 bpm. Exam Location:  Inpatient  Procedure: 2D Echo (Both Spectral and Color Flow Doppler were utilized during procedure).  Indications:    Afib  History:        Patient has no prior history of Echocardiogram examinations. Arrythmias:Atrial Fibrillation.  Sonographer:    Norleen Amour Referring Phys: 3 ARSHAD N KAKRAKANDY  IMPRESSIONS   1. Left ventricular ejection fraction, by estimation, is 70 to 75%. The left ventricle has hyperdynamic function. The left ventricle has no regional wall motion abnormalities. Left ventricular diastolic parameters are indeterminate. 2. Right ventricular systolic function is normal. The right ventricular size is normal. 3. Left atrial size was mildly dilated. 4. The mitral valve is normal in structure. No evidence of mitral valve regurgitation. No evidence of mitral stenosis. 5. The aortic valve is tricuspid. Aortic valve regurgitation is not visualized. No aortic stenosis is present. 6. There is mild (Grade II) plaque involving the ascending aorta. 7. The inferior vena cava is normal in size with greater than 50% respiratory variability, suggesting right atrial pressure of 3 mmHg.  FINDINGS Left Ventricle: Left  ventricular ejection fraction, by estimation, is 70 to 75%. The left ventricle has hyperdynamic function. The left ventricle has no regional wall motion abnormalities. The left ventricular internal cavity size was normal in size. There is no left ventricular hypertrophy. Left ventricular diastolic function could not be evaluated due to atrial fibrillation. Left ventricular diastolic parameters are indeterminate.  Right Ventricle: The right ventricular size is normal. No increase in right ventricular wall thickness. Right ventricular systolic function is normal.  Left Atrium: Left atrial size was mildly dilated.  Right Atrium: Right atrial size was normal in size.  Pericardium: There is no evidence of pericardial effusion.  Mitral Valve: The mitral valve is normal in structure. No evidence of mitral valve regurgitation. No evidence of mitral valve stenosis.  Tricuspid Valve: The tricuspid valve is normal in structure. Tricuspid valve regurgitation is not demonstrated. No evidence of tricuspid stenosis.  Aortic Valve: The aortic valve is tricuspid. Aortic valve regurgitation is not visualized. No aortic stenosis is present.  Pulmonic  Valve: The pulmonic valve was normal in structure. Pulmonic valve regurgitation is not visualized. No evidence of pulmonic stenosis.  Aorta: The aortic root is normal in size and structure. There is mild (Grade II) plaque involving the ascending aorta.  Venous: The inferior vena cava is normal in size with greater than 50% respiratory variability, suggesting right atrial pressure of 3 mmHg.  IAS/Shunts: No atrial level shunt detected by color flow Doppler.   LEFT VENTRICLE PLAX 2D LVIDd:         4.20 cm     Diastology LVIDs:         3.00 cm     LV e' medial:  8.20 cm/s LV PW:         1.10 cm     LV e' lateral: 10.60 cm/s LV IVS:        0.90 cm LVOT diam:     2.10 cm LV SV:         50 LV SV Index:   26 LVOT Area:     3.46 cm  LV Volumes (MOD) LV vol  d, MOD A2C: 40.4 ml LV vol d, MOD A4C: 53.8 ml LV vol s, MOD A2C: 12.7 ml LV vol s, MOD A4C: 12.6 ml LV SV MOD A2C:     27.7 ml LV SV MOD A4C:     53.8 ml LV SV MOD BP:      34.1 ml  RIGHT VENTRICLE             IVC RV Basal diam:  3.00 cm     IVC diam: 1.50 cm RV S prime:     16.20 cm/s TAPSE (M-mode): 1.7 cm  LEFT ATRIUM             Index        RIGHT ATRIUM           Index LA diam:        3.90 cm 2.00 cm/m   RA Area:     13.20 cm LA Vol (A2C):   56.8 ml 29.20 ml/m  RA Volume:   29.40 ml  15.11 ml/m LA Vol (A4C):   58.2 ml 29.92 ml/m LA Biplane Vol: 59.0 ml 30.33 ml/m AORTIC VALVE             PULMONIC VALVE LVOT Vmax:   87.50 cm/s  PV Vmax:       0.67 m/s LVOT Vmean:  61.800 cm/s PV Peak grad:  1.8 mmHg LVOT VTI:    0.145 m  AORTA Ao Root diam: 3.20 cm Ao Asc diam:  2.70 cm   SHUNTS Systemic VTI:  0.14 m Systemic Diam: 2.10 cm  Annabella Scarce MD Electronically signed by Annabella Scarce MD Signature Date/Time: 11/11/2023/12:06:42 PM    Final      CT SCANS  CT CORONARY MORPH W/CTA COR W/SCORE 06/14/2021  Addendum 06/14/2021  2:20 PM ADDENDUM REPORT: 06/14/2021 14:17  EXAM: OVER-READ INTERPRETATION CARDIAC CT CHEST  The following report is an over-read performed by radiologist Dr. Ryan Salvage of [facility] on 06/14/2021. This over-read does not include interpretation of cardiac or coronary anatomy or pathology. The coronary CTA interpretation by the cardiologist is attached.  COMPARISON:  03/27/2021 chest CT  FINDINGS: Extracardiac Vascular: Unremarkable  Mediastinum: 1.2 cm in short axis right hilar lymph node on image 3 series 10, formerly the same on 03/27/2021. Right infrahilar node 0.9 cm in short axis on image 10 series 10, previously the same.  Lung:  Unremarkable  Included Upper Abdomen: Suspected hepatic steatosis.  Musculoskeletal: Thoracic spondylosis. Broad Schmorl's node or chronic mild superior endplate  compression at T11, unchanged.  IMPRESSION: 1. Stable mild right hilar adenopathy, cause uncertain. 2. Suspected hepatic steatosis.   Electronically Signed By: Ryan Salvage M.D. On: 06/14/2021 14:17  Narrative CLINICAL DATA:  Chest pain  EXAM: Cardiac CTA  MEDICATIONS: Sub lingual nitro. 4mg  and lopressor  25mg   TECHNIQUE: The patient was scanned on a Siemens Force 192 slice scanner. Gantry rotation speed was 250 msecs. Collimation was .6 mm. A 100 kV prospective scan was triggered in the ascending thoracic aorta at 140 HU's Full mA was used between 35% and 75% of the R-R interval. Average HR during the scan was 65 bpm. The 3D data set was interpreted on a dedicated work station using MPR, MIP and VRT modes. A total of 80cc of contrast was used.  FINDINGS: Non-cardiac: See separate report from [facility]. No significant findings on limited lung and soft tissue windows.  Calcium Score: No calcium noted  Coronary Arteries: Right dominant with no anomalies  LM: Normal  LAD: Normal  D1: Normal  D2: Normal  Circumflex: Normal  OM1: Normal  OM2: Normal  RCA: Normal  PDA: Normal  PLA: Normal  IMPRESSION: 1. Calcium score 0  2.  Normal ascending thoracic aorta 3.2 cm  3.  Normal right dominant coronary arteries  Maude Emmer  Electronically Signed: By: Maude Emmer M.D. On: 06/14/2021 13:40     ______________________________________________________________________________________________          Recent Labs: 11/10/2023: ALT 20; B Natriuretic Peptide 342.8; TSH 1.888 11/12/2023: BUN 15; Creatinine, Ser 0.87; Hemoglobin 13.8; Magnesium 2.1; Platelets 229; Potassium 3.4; Sodium 141  Recent Lipid Panel No results found for: CHOL, TRIG, HDL, CHOLHDL, VLDL, LDLCALC, LDLDIRECT  Physical Exam:    VS:  BP 118/74   Pulse 66   Ht 5' 6 (1.676 m)   Wt 179 lb 9.6 oz (81.5 kg)   SpO2 98%   BMI 28.99 kg/m     Wt  Readings from Last 3 Encounters:  11/18/23 179 lb 9.6 oz (81.5 kg)  11/13/23 182 lb (82.6 kg)  11/11/23 181 lb 11.2 oz (82.4 kg)     GEN:  Well nourished, well developed in no acute distress HEENT: Normal NECK: No JVD; No carotid bruits LYMPHATICS: No lymphadenopathy CARDIAC: RRR, no murmurs, rubs, gallops RESPIRATORY:  Clear to auscultation without rales, wheezing or rhonchi  ABDOMEN: Soft, non-tender, non-distended MUSCULOSKELETAL:  No edema; No deformity  SKIN: Warm and dry NEUROLOGIC:  Alert and oriented x 3 PSYCHIATRIC:  Normal affect    Signed, Redell Leiter, MD  11/18/2023 1:42 PM    [facility]

## 2023-11-18 ENCOUNTER — Ambulatory Visit: Attending: Cardiology | Admitting: Cardiology

## 2023-11-18 ENCOUNTER — Encounter: Payer: Self-pay | Admitting: Cardiology

## 2023-11-18 VITALS — BP 118/74 | HR 66 | Ht 66.0 in | Wt 179.6 lb

## 2023-11-18 DIAGNOSIS — I10 Essential (primary) hypertension: Secondary | ICD-10-CM | POA: Diagnosis not present

## 2023-11-18 DIAGNOSIS — R072 Precordial pain: Secondary | ICD-10-CM

## 2023-11-18 DIAGNOSIS — I2699 Other pulmonary embolism without acute cor pulmonale: Secondary | ICD-10-CM | POA: Diagnosis not present

## 2023-11-18 DIAGNOSIS — I209 Angina pectoris, unspecified: Secondary | ICD-10-CM

## 2023-11-18 DIAGNOSIS — I480 Paroxysmal atrial fibrillation: Secondary | ICD-10-CM | POA: Diagnosis not present

## 2023-11-18 DIAGNOSIS — Z7901 Long term (current) use of anticoagulants: Secondary | ICD-10-CM | POA: Diagnosis not present

## 2023-11-18 NOTE — Patient Instructions (Signed)
 Medication Instructions:  Your physician recommends that you continue on your current medications as directed. Please refer to the Current Medication list given to you today.  *If you need a refill on your cardiac medications before your next appointment, please call your pharmacy*  Lab Work: Your physician recommends that you return for lab work in:   Labs today: BMP  If you have labs (blood work) drawn today and your tests are completely normal, you will receive your results only by: MyChart Message (if you have MyChart) OR A paper copy in the mail If you have any lab test that is abnormal or we need to change your treatment, we will call you to review the results.  Testing/Procedures:   Your cardiac CT will be scheduled at one of the below locations:   [facility] [address] [place], [place] [address] [phone] (Severe contrast allergies only)  OR   [facility] [address] [place], [place] [address] [phone]  OR   [facility] [address] [place], [place] [address] [phone]  OR   [facility] [address]  [place], [place] [address]  OR   [facility] [address] [place], [place] [phone]  If scheduled at [facility], please arrive at the [facility] and Children's Entrance (Entrance C2) of [facility] 30 minutes prior to test start time. You can use the FREE valet parking offered at entrance C (encouraged to control the heart rate for the test)  Proceed to the [facility] Radiology Department (first floor) to check-in and test prep.  All radiology patients and guests should use entrance C2 at [facility], accessed from [facility], even though the hospital's physical address listed is [address].  If scheduled at the Heart and Vascular Tower at [address], please enter the parking lot using the  [address] entrance and use the FREE valet service at the patient drop-off area. Enter the building and check-in with registration on the main floor.  If scheduled at [facility], please arrive to the Heart and Vascular Center 15 mins early for check-in and test prep.  There is spacious parking and easy access to the radiology department from the [facility] and Vascular entrance. Please enter here and check-in with the desk attendant.   If scheduled at [facility], please arrive 30 minutes early for check-in and test prep.  Please follow these instructions carefully (unless otherwise directed):  An IV will be required for this test and Nitroglycerin  will be given.  Hold all erectile dysfunction medications at least 3 days (72 hrs) prior to test. (Ie viagra, cialis, sildenafil, tadalafil, etc)   On the Night Before the Test: Be sure to Drink plenty of water. Do not consume any caffeinated/decaffeinated beverages or chocolate 12 hours prior to your test. Do not take any antihistamines 12 hours prior to your test.  On the Day of the Test: Drink plenty of water until 1 hour prior to the test. Do not eat any food 1 hour prior to test. You may take your regular medications prior to the test.  Take metoprolol  (Lopressor ) two hours prior to test. Patients who wear a continuous glucose monitor MUST remove the device prior to scanning. FEMALES- please wear underwire-free bra if available, avoid dresses & tight clothing        After the Test: Drink plenty of water.  After receiving IV contrast, you may experience a mild flushed feeling. This is normal. On occasion, you may experience a mild rash up to 24 hours after the test. This is not dangerous. If this occurs, you can take Benadryl  25 mg, Zyrtec, Claritin, or Allegra and increase your fluid intake. (Patients taking Tikosyn should avoid Benadryl , and may take Zyrtec, Claritin, or Allegra) If you experience  trouble breathing, this can be serious. If it is severe call 911 IMMEDIATELY. If it is mild, please call our office.  We will call to schedule your test 2-4 weeks out understanding that some insurance companies will need an authorization prior to the service being performed.   For more information and frequently asked questions, please visit our website : [email or web address]  For non-scheduling related questions, please contact the cardiac imaging nurse navigator should you have any questions/concerns: Cardiac Imaging Nurse Navigators Direct Office Dial: [phone]   For scheduling needs, including cancellations and rescheduling, please call Brittany, [phone].   Follow-Up: At [facility], you and your health needs are our priority.  As part of our continuing mission to provide you with exceptional heart care, our providers are all part of one team.  This team includes your primary Cardiologist (physician) and Advanced Practice Providers or APPs (Physician Assistants and Nurse Practitioners) who all work together to provide you with the care you need, when you need it.  Your next appointment:   6 month(s)  Provider:   Redell Leiter, MD    We recommend signing up for the patient portal called MyChart.  Sign up information is provided on this After Visit Summary.  MyChart is used to connect with patients for Virtual Visits (Telemedicine).  Patients are able to view lab/test results, encounter notes, upcoming appointments, etc.  Non-urgent messages can be sent to your provider as well.   To learn more about what you can do with MyChart, go to [email or web address].   Other Instructions None

## 2023-11-19 ENCOUNTER — Ambulatory Visit: Payer: Self-pay | Admitting: Cardiology

## 2023-11-19 LAB — BASIC METABOLIC PANEL WITH GFR
BUN/Creatinine Ratio: 14 (ref 12–28)
BUN: 16 mg/dL (ref 8–27)
CO2: 23 mmol/L (ref 20–29)
Calcium: 9.5 mg/dL (ref 8.7–10.3)
Chloride: 106 mmol/L (ref 96–106)
Creatinine, Ser: 1.15 mg/dL — ABNORMAL HIGH (ref 0.57–1.00)
Glucose: 93 mg/dL (ref 70–99)
Potassium: 3.9 mmol/L (ref 3.5–5.2)
Sodium: 144 mmol/L (ref 134–144)
eGFR: 50 mL/min/1.73 — ABNORMAL LOW (ref >59)

## 2023-12-09 NOTE — Progress Notes (Signed)
 Stacy Clayton                                          MRN: [id number]   12/09/2023   The VBCI Quality Team Specialist reviewed this patient medical record for the purposes of chart review for care gap closure. The following were reviewed: chart review for care gap closure-kidney health evaluation for diabetes:eGFR  and uACR.    VBCI Quality Team

## 2023-12-22 ENCOUNTER — Encounter (HOSPITAL_COMMUNITY): Payer: Self-pay

## 2023-12-24 ENCOUNTER — Ambulatory Visit (INDEPENDENT_AMBULATORY_CARE_PROVIDER_SITE_OTHER)
Admission: RE | Admit: 2023-12-24 | Discharge: 2023-12-24 | Disposition: A | Discharge status: Home / Self Care | Source: Ambulatory Visit | Attending: Cardiology | Admitting: Cardiology

## 2023-12-24 DIAGNOSIS — R072 Precordial pain: Secondary | ICD-10-CM | POA: Diagnosis not present

## 2023-12-24 MED ORDER — NITROGLYCERIN 0.4 MG SL SUBL
0.8000 mg | SUBLINGUAL_TABLET | Freq: Once | SUBLINGUAL | Status: AC
  Administered 2023-12-24: 10:00:00 0.8 mg via SUBLINGUAL

## 2023-12-24 MED ORDER — IOHEXOL 350 MG/ML SOLN
100.0000 mL | Freq: Once | INTRAVENOUS | Status: AC | PRN
  Administered 2023-12-24: 11:00:00 100 mL via INTRAVENOUS

## 2023-12-25 ENCOUNTER — Encounter: Payer: Self-pay | Admitting: Cardiology

## 2024-01-12 ENCOUNTER — Ambulatory Visit: Admitting: Cardiology
